# Patient Record
Sex: Female | Born: 1935 | Race: White | Hispanic: No | Smoking: Never smoker
Health system: Southern US, Community
[De-identification: ages and names within clinical notes are randomized; demographics above are authoritative.]

## PROBLEM LIST (undated history)

## (undated) DIAGNOSIS — F0390 Unspecified dementia without behavioral disturbance: Secondary | ICD-10-CM

## (undated) DIAGNOSIS — K5792 Diverticulitis of intestine, part unspecified, without perforation or abscess without bleeding: Secondary | ICD-10-CM

## (undated) DIAGNOSIS — E1122 Type 2 diabetes mellitus with diabetic chronic kidney disease: Secondary | ICD-10-CM

## (undated) DIAGNOSIS — D649 Anemia, unspecified: Secondary | ICD-10-CM

## (undated) DIAGNOSIS — E559 Vitamin D deficiency, unspecified: Secondary | ICD-10-CM

## (undated) DIAGNOSIS — I6523 Occlusion and stenosis of bilateral carotid arteries: Secondary | ICD-10-CM

## (undated) DIAGNOSIS — M810 Age-related osteoporosis without current pathological fracture: Secondary | ICD-10-CM

## (undated) DIAGNOSIS — N183 Chronic kidney disease, stage 3 unspecified: Secondary | ICD-10-CM

## (undated) DIAGNOSIS — E119 Type 2 diabetes mellitus without complications: Secondary | ICD-10-CM

## (undated) DIAGNOSIS — M25471 Effusion, right ankle: Secondary | ICD-10-CM

## (undated) DIAGNOSIS — E538 Deficiency of other specified B group vitamins: Secondary | ICD-10-CM

## (undated) DIAGNOSIS — E785 Hyperlipidemia, unspecified: Secondary | ICD-10-CM

## (undated) DIAGNOSIS — I739 Peripheral vascular disease, unspecified: Secondary | ICD-10-CM

## (undated) DIAGNOSIS — I10 Essential (primary) hypertension: Secondary | ICD-10-CM

## (undated) DIAGNOSIS — N952 Postmenopausal atrophic vaginitis: Secondary | ICD-10-CM

## (undated) DIAGNOSIS — M25472 Effusion, left ankle: Secondary | ICD-10-CM

---

## 2003-12-06 ENCOUNTER — Other Ambulatory Visit: Payer: Self-pay

## 2004-08-07 ENCOUNTER — Ambulatory Visit: Payer: Self-pay

## 2004-10-01 ENCOUNTER — Ambulatory Visit: Payer: Self-pay | Admitting: Unknown Physician Specialty

## 2004-10-01 ENCOUNTER — Other Ambulatory Visit: Payer: Self-pay

## 2004-10-05 ENCOUNTER — Ambulatory Visit: Payer: Self-pay | Admitting: Unknown Physician Specialty

## 2004-11-07 ENCOUNTER — Ambulatory Visit: Payer: Self-pay | Admitting: Unknown Physician Specialty

## 2004-11-29 ENCOUNTER — Ambulatory Visit: Payer: Self-pay | Admitting: Unknown Physician Specialty

## 2005-01-11 ENCOUNTER — Ambulatory Visit: Payer: Self-pay | Admitting: Unknown Physician Specialty

## 2005-02-28 ENCOUNTER — Ambulatory Visit: Payer: Self-pay | Admitting: Unknown Physician Specialty

## 2005-03-28 ENCOUNTER — Ambulatory Visit: Payer: Self-pay | Admitting: Unknown Physician Specialty

## 2005-04-16 ENCOUNTER — Ambulatory Visit: Payer: Self-pay | Admitting: Unknown Physician Specialty

## 2005-05-30 ENCOUNTER — Ambulatory Visit: Payer: Self-pay | Admitting: Unknown Physician Specialty

## 2005-09-10 ENCOUNTER — Ambulatory Visit: Payer: Self-pay | Admitting: Unknown Physician Specialty

## 2006-05-15 ENCOUNTER — Ambulatory Visit: Payer: Self-pay | Admitting: Unknown Physician Specialty

## 2006-07-15 ENCOUNTER — Ambulatory Visit: Payer: Self-pay | Admitting: Unknown Physician Specialty

## 2007-02-07 ENCOUNTER — Emergency Department: Payer: Self-pay | Admitting: Emergency Medicine

## 2007-02-11 ENCOUNTER — Ambulatory Visit: Payer: Self-pay | Admitting: Unknown Physician Specialty

## 2007-03-29 ENCOUNTER — Emergency Department: Payer: Self-pay | Admitting: Emergency Medicine

## 2008-11-01 ENCOUNTER — Ambulatory Visit: Payer: Self-pay | Admitting: Oncology

## 2008-11-15 ENCOUNTER — Ambulatory Visit: Payer: Self-pay | Admitting: Unknown Physician Specialty

## 2008-12-01 ENCOUNTER — Ambulatory Visit: Payer: Self-pay | Admitting: Oncology

## 2008-12-07 ENCOUNTER — Ambulatory Visit: Payer: Self-pay | Admitting: Oncology

## 2009-01-01 ENCOUNTER — Ambulatory Visit: Payer: Self-pay | Admitting: Oncology

## 2009-02-01 ENCOUNTER — Ambulatory Visit: Payer: Self-pay | Admitting: Oncology

## 2009-02-20 ENCOUNTER — Ambulatory Visit: Payer: Self-pay | Admitting: Oncology

## 2009-03-03 ENCOUNTER — Ambulatory Visit: Payer: Self-pay | Admitting: Oncology

## 2009-06-03 ENCOUNTER — Ambulatory Visit: Payer: Self-pay | Admitting: Oncology

## 2009-06-28 ENCOUNTER — Ambulatory Visit: Payer: Self-pay | Admitting: Oncology

## 2009-07-04 ENCOUNTER — Ambulatory Visit: Payer: Self-pay | Admitting: Oncology

## 2010-02-12 ENCOUNTER — Ambulatory Visit: Payer: Self-pay | Admitting: Unknown Physician Specialty

## 2010-04-09 ENCOUNTER — Ambulatory Visit: Payer: Self-pay | Admitting: Unknown Physician Specialty

## 2010-04-11 LAB — PATHOLOGY REPORT

## 2010-07-15 ENCOUNTER — Emergency Department: Payer: Self-pay | Admitting: Unknown Physician Specialty

## 2011-02-13 ENCOUNTER — Ambulatory Visit: Payer: Self-pay | Admitting: Unknown Physician Specialty

## 2012-05-28 ENCOUNTER — Ambulatory Visit: Payer: Self-pay | Admitting: Unknown Physician Specialty

## 2012-05-30 ENCOUNTER — Emergency Department: Payer: Self-pay | Admitting: Unknown Physician Specialty

## 2013-05-31 ENCOUNTER — Ambulatory Visit: Payer: Self-pay | Admitting: Internal Medicine

## 2015-06-19 DIAGNOSIS — H40033 Anatomical narrow angle, bilateral: Secondary | ICD-10-CM | POA: Diagnosis not present

## 2015-06-26 DIAGNOSIS — G309 Alzheimer's disease, unspecified: Secondary | ICD-10-CM | POA: Diagnosis not present

## 2015-06-26 DIAGNOSIS — N183 Chronic kidney disease, stage 3 (moderate): Secondary | ICD-10-CM | POA: Diagnosis not present

## 2015-06-26 DIAGNOSIS — I10 Essential (primary) hypertension: Secondary | ICD-10-CM | POA: Diagnosis not present

## 2015-06-26 DIAGNOSIS — E785 Hyperlipidemia, unspecified: Secondary | ICD-10-CM | POA: Diagnosis not present

## 2015-06-26 DIAGNOSIS — E1129 Type 2 diabetes mellitus with other diabetic kidney complication: Secondary | ICD-10-CM | POA: Diagnosis not present

## 2015-07-03 DIAGNOSIS — N183 Chronic kidney disease, stage 3 (moderate): Secondary | ICD-10-CM | POA: Diagnosis not present

## 2015-07-03 DIAGNOSIS — I10 Essential (primary) hypertension: Secondary | ICD-10-CM | POA: Diagnosis not present

## 2015-07-03 DIAGNOSIS — E785 Hyperlipidemia, unspecified: Secondary | ICD-10-CM | POA: Diagnosis not present

## 2015-07-31 DIAGNOSIS — H40003 Preglaucoma, unspecified, bilateral: Secondary | ICD-10-CM | POA: Diagnosis not present

## 2015-08-04 DIAGNOSIS — H40032 Anatomical narrow angle, left eye: Secondary | ICD-10-CM | POA: Diagnosis not present

## 2015-08-26 DIAGNOSIS — E119 Type 2 diabetes mellitus without complications: Secondary | ICD-10-CM | POA: Diagnosis not present

## 2015-08-31 DIAGNOSIS — H40031 Anatomical narrow angle, right eye: Secondary | ICD-10-CM | POA: Diagnosis not present

## 2015-09-14 DIAGNOSIS — E119 Type 2 diabetes mellitus without complications: Secondary | ICD-10-CM | POA: Diagnosis not present

## 2015-09-14 DIAGNOSIS — B351 Tinea unguium: Secondary | ICD-10-CM | POA: Diagnosis not present

## 2015-09-14 DIAGNOSIS — M79671 Pain in right foot: Secondary | ICD-10-CM | POA: Diagnosis not present

## 2015-09-14 DIAGNOSIS — H40033 Anatomical narrow angle, bilateral: Secondary | ICD-10-CM | POA: Diagnosis not present

## 2015-09-14 DIAGNOSIS — M79672 Pain in left foot: Secondary | ICD-10-CM | POA: Diagnosis not present

## 2015-11-13 DIAGNOSIS — G609 Hereditary and idiopathic neuropathy, unspecified: Secondary | ICD-10-CM | POA: Diagnosis not present

## 2015-11-13 DIAGNOSIS — E785 Hyperlipidemia, unspecified: Secondary | ICD-10-CM | POA: Diagnosis not present

## 2015-11-13 DIAGNOSIS — F0280 Dementia in other diseases classified elsewhere without behavioral disturbance: Secondary | ICD-10-CM | POA: Diagnosis not present

## 2015-11-13 DIAGNOSIS — I10 Essential (primary) hypertension: Secondary | ICD-10-CM | POA: Diagnosis not present

## 2015-11-13 DIAGNOSIS — N183 Chronic kidney disease, stage 3 (moderate): Secondary | ICD-10-CM | POA: Diagnosis not present

## 2015-11-22 DIAGNOSIS — E785 Hyperlipidemia, unspecified: Secondary | ICD-10-CM | POA: Diagnosis not present

## 2015-11-22 DIAGNOSIS — D519 Vitamin B12 deficiency anemia, unspecified: Secondary | ICD-10-CM | POA: Diagnosis not present

## 2015-11-22 DIAGNOSIS — I10 Essential (primary) hypertension: Secondary | ICD-10-CM | POA: Diagnosis not present

## 2015-11-22 DIAGNOSIS — E1129 Type 2 diabetes mellitus with other diabetic kidney complication: Secondary | ICD-10-CM | POA: Diagnosis not present

## 2015-11-22 DIAGNOSIS — Z79899 Other long term (current) drug therapy: Secondary | ICD-10-CM | POA: Diagnosis not present

## 2015-11-22 DIAGNOSIS — D631 Anemia in chronic kidney disease: Secondary | ICD-10-CM | POA: Diagnosis not present

## 2015-12-14 DIAGNOSIS — R261 Paralytic gait: Secondary | ICD-10-CM | POA: Diagnosis not present

## 2015-12-14 DIAGNOSIS — M79674 Pain in right toe(s): Secondary | ICD-10-CM | POA: Diagnosis not present

## 2015-12-14 DIAGNOSIS — I70203 Unspecified atherosclerosis of native arteries of extremities, bilateral legs: Secondary | ICD-10-CM | POA: Diagnosis not present

## 2015-12-14 DIAGNOSIS — B351 Tinea unguium: Secondary | ICD-10-CM | POA: Diagnosis not present

## 2015-12-28 DIAGNOSIS — H40033 Anatomical narrow angle, bilateral: Secondary | ICD-10-CM | POA: Diagnosis not present

## 2016-01-30 DIAGNOSIS — H25813 Combined forms of age-related cataract, bilateral: Secondary | ICD-10-CM | POA: Diagnosis not present

## 2016-01-30 DIAGNOSIS — Z7984 Long term (current) use of oral hypoglycemic drugs: Secondary | ICD-10-CM | POA: Diagnosis not present

## 2016-01-30 DIAGNOSIS — E119 Type 2 diabetes mellitus without complications: Secondary | ICD-10-CM | POA: Diagnosis not present

## 2016-01-30 DIAGNOSIS — H401134 Primary open-angle glaucoma, bilateral, indeterminate stage: Secondary | ICD-10-CM | POA: Diagnosis not present

## 2016-02-28 DIAGNOSIS — H40033 Anatomical narrow angle, bilateral: Secondary | ICD-10-CM | POA: Diagnosis not present

## 2016-03-28 DIAGNOSIS — E1129 Type 2 diabetes mellitus with other diabetic kidney complication: Secondary | ICD-10-CM | POA: Diagnosis not present

## 2016-03-28 DIAGNOSIS — Z79899 Other long term (current) drug therapy: Secondary | ICD-10-CM | POA: Diagnosis not present

## 2016-03-28 DIAGNOSIS — I10 Essential (primary) hypertension: Secondary | ICD-10-CM | POA: Diagnosis not present

## 2016-04-01 DIAGNOSIS — B351 Tinea unguium: Secondary | ICD-10-CM | POA: Diagnosis not present

## 2016-04-01 DIAGNOSIS — E1129 Type 2 diabetes mellitus with other diabetic kidney complication: Secondary | ICD-10-CM | POA: Diagnosis not present

## 2016-04-01 DIAGNOSIS — E875 Hyperkalemia: Secondary | ICD-10-CM | POA: Diagnosis not present

## 2016-04-11 DIAGNOSIS — Z79899 Other long term (current) drug therapy: Secondary | ICD-10-CM | POA: Diagnosis not present

## 2016-04-29 DIAGNOSIS — N183 Chronic kidney disease, stage 3 (moderate): Secondary | ICD-10-CM | POA: Diagnosis not present

## 2016-04-29 DIAGNOSIS — E1129 Type 2 diabetes mellitus with other diabetic kidney complication: Secondary | ICD-10-CM | POA: Diagnosis not present

## 2016-04-29 DIAGNOSIS — E875 Hyperkalemia: Secondary | ICD-10-CM | POA: Diagnosis not present

## 2016-04-29 DIAGNOSIS — D649 Anemia, unspecified: Secondary | ICD-10-CM | POA: Diagnosis not present

## 2016-04-29 DIAGNOSIS — I150 Renovascular hypertension: Secondary | ICD-10-CM | POA: Diagnosis not present

## 2016-05-02 DIAGNOSIS — Z79899 Other long term (current) drug therapy: Secondary | ICD-10-CM | POA: Diagnosis not present

## 2016-05-08 DIAGNOSIS — M79674 Pain in right toe(s): Secondary | ICD-10-CM | POA: Diagnosis not present

## 2016-05-08 DIAGNOSIS — E119 Type 2 diabetes mellitus without complications: Secondary | ICD-10-CM | POA: Diagnosis not present

## 2016-05-08 DIAGNOSIS — I70203 Unspecified atherosclerosis of native arteries of extremities, bilateral legs: Secondary | ICD-10-CM | POA: Diagnosis not present

## 2016-05-08 DIAGNOSIS — B351 Tinea unguium: Secondary | ICD-10-CM | POA: Diagnosis not present

## 2016-05-13 DIAGNOSIS — E875 Hyperkalemia: Secondary | ICD-10-CM | POA: Diagnosis not present

## 2016-05-13 DIAGNOSIS — I10 Essential (primary) hypertension: Secondary | ICD-10-CM | POA: Diagnosis not present

## 2016-05-13 DIAGNOSIS — F0280 Dementia in other diseases classified elsewhere without behavioral disturbance: Secondary | ICD-10-CM | POA: Diagnosis not present

## 2016-05-17 DIAGNOSIS — D519 Vitamin B12 deficiency anemia, unspecified: Secondary | ICD-10-CM | POA: Diagnosis not present

## 2016-05-17 DIAGNOSIS — Z79899 Other long term (current) drug therapy: Secondary | ICD-10-CM | POA: Diagnosis not present

## 2016-05-17 DIAGNOSIS — D649 Anemia, unspecified: Secondary | ICD-10-CM | POA: Diagnosis not present

## 2016-05-17 DIAGNOSIS — D631 Anemia in chronic kidney disease: Secondary | ICD-10-CM | POA: Diagnosis not present

## 2016-06-10 DIAGNOSIS — D649 Anemia, unspecified: Secondary | ICD-10-CM | POA: Diagnosis not present

## 2016-06-10 DIAGNOSIS — E119 Type 2 diabetes mellitus without complications: Secondary | ICD-10-CM | POA: Diagnosis not present

## 2016-06-10 DIAGNOSIS — I150 Renovascular hypertension: Secondary | ICD-10-CM | POA: Diagnosis not present

## 2016-06-10 DIAGNOSIS — I498 Other specified cardiac arrhythmias: Secondary | ICD-10-CM | POA: Diagnosis not present

## 2016-06-10 DIAGNOSIS — R54 Age-related physical debility: Secondary | ICD-10-CM | POA: Diagnosis not present

## 2016-06-10 DIAGNOSIS — I10 Essential (primary) hypertension: Secondary | ICD-10-CM | POA: Diagnosis not present

## 2016-06-11 DIAGNOSIS — R001 Bradycardia, unspecified: Secondary | ICD-10-CM | POA: Diagnosis not present

## 2016-06-27 DIAGNOSIS — E785 Hyperlipidemia, unspecified: Secondary | ICD-10-CM | POA: Diagnosis not present

## 2016-06-27 DIAGNOSIS — R001 Bradycardia, unspecified: Secondary | ICD-10-CM | POA: Diagnosis not present

## 2016-06-27 DIAGNOSIS — E1129 Type 2 diabetes mellitus with other diabetic kidney complication: Secondary | ICD-10-CM | POA: Diagnosis not present

## 2016-06-27 DIAGNOSIS — Z79899 Other long term (current) drug therapy: Secondary | ICD-10-CM | POA: Diagnosis not present

## 2016-07-08 DIAGNOSIS — N183 Chronic kidney disease, stage 3 (moderate): Secondary | ICD-10-CM | POA: Diagnosis not present

## 2016-07-08 DIAGNOSIS — I10 Essential (primary) hypertension: Secondary | ICD-10-CM | POA: Diagnosis not present

## 2016-07-08 DIAGNOSIS — I4519 Other right bundle-branch block: Secondary | ICD-10-CM | POA: Diagnosis not present

## 2016-07-08 DIAGNOSIS — I498 Other specified cardiac arrhythmias: Secondary | ICD-10-CM | POA: Diagnosis not present

## 2016-07-08 DIAGNOSIS — R54 Age-related physical debility: Secondary | ICD-10-CM | POA: Diagnosis not present

## 2016-07-16 DIAGNOSIS — E113291 Type 2 diabetes mellitus with mild nonproliferative diabetic retinopathy without macular edema, right eye: Secondary | ICD-10-CM | POA: Diagnosis not present

## 2016-07-16 DIAGNOSIS — H401134 Primary open-angle glaucoma, bilateral, indeterminate stage: Secondary | ICD-10-CM | POA: Diagnosis not present

## 2016-07-16 DIAGNOSIS — E113312 Type 2 diabetes mellitus with moderate nonproliferative diabetic retinopathy with macular edema, left eye: Secondary | ICD-10-CM | POA: Diagnosis not present

## 2016-07-16 DIAGNOSIS — H25813 Combined forms of age-related cataract, bilateral: Secondary | ICD-10-CM | POA: Diagnosis not present

## 2016-07-23 DIAGNOSIS — G309 Alzheimer's disease, unspecified: Secondary | ICD-10-CM | POA: Diagnosis not present

## 2016-07-23 DIAGNOSIS — D649 Anemia, unspecified: Secondary | ICD-10-CM | POA: Diagnosis not present

## 2016-08-06 DIAGNOSIS — I4519 Other right bundle-branch block: Secondary | ICD-10-CM | POA: Diagnosis not present

## 2016-08-06 DIAGNOSIS — N183 Chronic kidney disease, stage 3 (moderate): Secondary | ICD-10-CM | POA: Diagnosis not present

## 2016-08-06 DIAGNOSIS — R54 Age-related physical debility: Secondary | ICD-10-CM | POA: Diagnosis not present

## 2016-08-06 DIAGNOSIS — I10 Essential (primary) hypertension: Secondary | ICD-10-CM | POA: Diagnosis not present

## 2016-08-06 DIAGNOSIS — D649 Anemia, unspecified: Secondary | ICD-10-CM | POA: Diagnosis not present

## 2016-08-06 DIAGNOSIS — E785 Hyperlipidemia, unspecified: Secondary | ICD-10-CM | POA: Diagnosis not present

## 2016-08-06 DIAGNOSIS — E119 Type 2 diabetes mellitus without complications: Secondary | ICD-10-CM | POA: Diagnosis not present

## 2016-08-06 DIAGNOSIS — I498 Other specified cardiac arrhythmias: Secondary | ICD-10-CM | POA: Diagnosis not present

## 2016-08-13 DIAGNOSIS — F0390 Unspecified dementia without behavioral disturbance: Secondary | ICD-10-CM | POA: Diagnosis not present

## 2016-08-20 DIAGNOSIS — I10 Essential (primary) hypertension: Secondary | ICD-10-CM | POA: Diagnosis not present

## 2016-08-20 DIAGNOSIS — Z833 Family history of diabetes mellitus: Secondary | ICD-10-CM | POA: Diagnosis not present

## 2016-08-20 DIAGNOSIS — D519 Vitamin B12 deficiency anemia, unspecified: Secondary | ICD-10-CM | POA: Diagnosis not present

## 2016-08-23 DIAGNOSIS — R001 Bradycardia, unspecified: Secondary | ICD-10-CM | POA: Diagnosis not present

## 2016-08-23 DIAGNOSIS — D649 Anemia, unspecified: Secondary | ICD-10-CM | POA: Diagnosis not present

## 2016-08-23 DIAGNOSIS — E782 Mixed hyperlipidemia: Secondary | ICD-10-CM | POA: Diagnosis not present

## 2016-08-23 DIAGNOSIS — I6523 Occlusion and stenosis of bilateral carotid arteries: Secondary | ICD-10-CM | POA: Diagnosis not present

## 2016-08-23 DIAGNOSIS — R0602 Shortness of breath: Secondary | ICD-10-CM | POA: Diagnosis not present

## 2016-08-23 DIAGNOSIS — I10 Essential (primary) hypertension: Secondary | ICD-10-CM | POA: Diagnosis not present

## 2016-08-29 DIAGNOSIS — R001 Bradycardia, unspecified: Secondary | ICD-10-CM | POA: Diagnosis not present

## 2016-09-18 DIAGNOSIS — R0602 Shortness of breath: Secondary | ICD-10-CM | POA: Diagnosis not present

## 2016-09-18 DIAGNOSIS — I6523 Occlusion and stenosis of bilateral carotid arteries: Secondary | ICD-10-CM | POA: Diagnosis not present

## 2016-09-18 DIAGNOSIS — E782 Mixed hyperlipidemia: Secondary | ICD-10-CM | POA: Diagnosis not present

## 2016-09-18 DIAGNOSIS — I10 Essential (primary) hypertension: Secondary | ICD-10-CM | POA: Diagnosis not present

## 2016-09-20 DIAGNOSIS — H401133 Primary open-angle glaucoma, bilateral, severe stage: Secondary | ICD-10-CM | POA: Diagnosis not present

## 2016-10-22 DIAGNOSIS — H401133 Primary open-angle glaucoma, bilateral, severe stage: Secondary | ICD-10-CM | POA: Diagnosis not present

## 2016-10-29 DIAGNOSIS — H401133 Primary open-angle glaucoma, bilateral, severe stage: Secondary | ICD-10-CM | POA: Diagnosis not present

## 2016-11-05 DIAGNOSIS — Z833 Family history of diabetes mellitus: Secondary | ICD-10-CM | POA: Diagnosis not present

## 2016-11-05 DIAGNOSIS — E1129 Type 2 diabetes mellitus with other diabetic kidney complication: Secondary | ICD-10-CM | POA: Diagnosis not present

## 2016-11-05 DIAGNOSIS — D519 Vitamin B12 deficiency anemia, unspecified: Secondary | ICD-10-CM | POA: Diagnosis not present

## 2016-11-05 DIAGNOSIS — Z79899 Other long term (current) drug therapy: Secondary | ICD-10-CM | POA: Diagnosis not present

## 2016-11-05 DIAGNOSIS — D631 Anemia in chronic kidney disease: Secondary | ICD-10-CM | POA: Diagnosis not present

## 2016-11-13 DIAGNOSIS — E782 Mixed hyperlipidemia: Secondary | ICD-10-CM | POA: Diagnosis not present

## 2016-11-13 DIAGNOSIS — I10 Essential (primary) hypertension: Secondary | ICD-10-CM | POA: Diagnosis not present

## 2016-11-13 DIAGNOSIS — I6523 Occlusion and stenosis of bilateral carotid arteries: Secondary | ICD-10-CM | POA: Diagnosis not present

## 2016-11-13 DIAGNOSIS — R001 Bradycardia, unspecified: Secondary | ICD-10-CM | POA: Diagnosis not present

## 2016-11-26 DIAGNOSIS — I498 Other specified cardiac arrhythmias: Secondary | ICD-10-CM | POA: Diagnosis not present

## 2016-11-26 DIAGNOSIS — R54 Age-related physical debility: Secondary | ICD-10-CM | POA: Diagnosis not present

## 2016-11-26 DIAGNOSIS — E1140 Type 2 diabetes mellitus with diabetic neuropathy, unspecified: Secondary | ICD-10-CM | POA: Diagnosis not present

## 2016-11-26 DIAGNOSIS — E119 Type 2 diabetes mellitus without complications: Secondary | ICD-10-CM | POA: Diagnosis not present

## 2016-11-26 DIAGNOSIS — D649 Anemia, unspecified: Secondary | ICD-10-CM | POA: Diagnosis not present

## 2016-11-26 DIAGNOSIS — I10 Essential (primary) hypertension: Secondary | ICD-10-CM | POA: Diagnosis not present

## 2016-11-26 DIAGNOSIS — N183 Chronic kidney disease, stage 3 (moderate): Secondary | ICD-10-CM | POA: Diagnosis not present

## 2016-11-26 DIAGNOSIS — E785 Hyperlipidemia, unspecified: Secondary | ICD-10-CM | POA: Diagnosis not present

## 2016-11-29 DIAGNOSIS — H401133 Primary open-angle glaucoma, bilateral, severe stage: Secondary | ICD-10-CM | POA: Diagnosis not present

## 2016-12-27 DIAGNOSIS — E119 Type 2 diabetes mellitus without complications: Secondary | ICD-10-CM | POA: Diagnosis not present

## 2016-12-27 DIAGNOSIS — E1159 Type 2 diabetes mellitus with other circulatory complications: Secondary | ICD-10-CM | POA: Diagnosis not present

## 2017-01-21 DIAGNOSIS — G309 Alzheimer's disease, unspecified: Secondary | ICD-10-CM | POA: Diagnosis not present

## 2017-01-21 DIAGNOSIS — R400 Somnolence: Secondary | ICD-10-CM | POA: Diagnosis not present

## 2017-01-21 DIAGNOSIS — E1140 Type 2 diabetes mellitus with diabetic neuropathy, unspecified: Secondary | ICD-10-CM | POA: Diagnosis not present

## 2017-01-21 DIAGNOSIS — I10 Essential (primary) hypertension: Secondary | ICD-10-CM | POA: Diagnosis not present

## 2017-01-21 DIAGNOSIS — D649 Anemia, unspecified: Secondary | ICD-10-CM | POA: Diagnosis not present

## 2018-02-25 ENCOUNTER — Other Ambulatory Visit: Payer: Self-pay | Admitting: Internal Medicine

## 2018-02-25 DIAGNOSIS — Z1231 Encounter for screening mammogram for malignant neoplasm of breast: Secondary | ICD-10-CM

## 2019-05-09 ENCOUNTER — Other Ambulatory Visit: Payer: Self-pay

## 2019-05-09 ENCOUNTER — Inpatient Hospital Stay
Admission: EM | Admit: 2019-05-09 | Discharge: 2019-05-11 | DRG: 309 | Disposition: A | Discharge status: Skilled Nursing Facility | Payer: Medicare Other | Source: Skilled Nursing Facility | Attending: Family Medicine | Admitting: Family Medicine

## 2019-05-09 ENCOUNTER — Emergency Department: Payer: Medicare Other

## 2019-05-09 DIAGNOSIS — E1140 Type 2 diabetes mellitus with diabetic neuropathy, unspecified: Secondary | ICD-10-CM | POA: Diagnosis present

## 2019-05-09 DIAGNOSIS — I6523 Occlusion and stenosis of bilateral carotid arteries: Secondary | ICD-10-CM | POA: Diagnosis present

## 2019-05-09 DIAGNOSIS — N1832 Chronic kidney disease, stage 3b: Secondary | ICD-10-CM | POA: Diagnosis present

## 2019-05-09 DIAGNOSIS — I160 Hypertensive urgency: Secondary | ICD-10-CM | POA: Diagnosis not present

## 2019-05-09 DIAGNOSIS — E782 Mixed hyperlipidemia: Secondary | ICD-10-CM | POA: Diagnosis present

## 2019-05-09 DIAGNOSIS — E785 Hyperlipidemia, unspecified: Secondary | ICD-10-CM | POA: Diagnosis not present

## 2019-05-09 DIAGNOSIS — I452 Bifascicular block: Secondary | ICD-10-CM | POA: Diagnosis not present

## 2019-05-09 DIAGNOSIS — I4510 Unspecified right bundle-branch block: Secondary | ICD-10-CM | POA: Diagnosis present

## 2019-05-09 DIAGNOSIS — I129 Hypertensive chronic kidney disease with stage 1 through stage 4 chronic kidney disease, or unspecified chronic kidney disease: Secondary | ICD-10-CM | POA: Diagnosis present

## 2019-05-09 DIAGNOSIS — E559 Vitamin D deficiency, unspecified: Secondary | ICD-10-CM | POA: Diagnosis present

## 2019-05-09 DIAGNOSIS — Z888 Allergy status to other drugs, medicaments and biological substances status: Secondary | ICD-10-CM

## 2019-05-09 DIAGNOSIS — R079 Chest pain, unspecified: Secondary | ICD-10-CM | POA: Diagnosis present

## 2019-05-09 DIAGNOSIS — Z20828 Contact with and (suspected) exposure to other viral communicable diseases: Secondary | ICD-10-CM | POA: Diagnosis present

## 2019-05-09 DIAGNOSIS — Z90710 Acquired absence of both cervix and uterus: Secondary | ICD-10-CM

## 2019-05-09 DIAGNOSIS — H409 Unspecified glaucoma: Secondary | ICD-10-CM | POA: Diagnosis present

## 2019-05-09 DIAGNOSIS — H9192 Unspecified hearing loss, left ear: Secondary | ICD-10-CM | POA: Diagnosis present

## 2019-05-09 DIAGNOSIS — I959 Hypotension, unspecified: Secondary | ICD-10-CM | POA: Diagnosis not present

## 2019-05-09 DIAGNOSIS — M810 Age-related osteoporosis without current pathological fracture: Secondary | ICD-10-CM | POA: Diagnosis present

## 2019-05-09 DIAGNOSIS — R001 Bradycardia, unspecified: Secondary | ICD-10-CM

## 2019-05-09 DIAGNOSIS — E538 Deficiency of other specified B group vitamins: Secondary | ICD-10-CM | POA: Diagnosis present

## 2019-05-09 DIAGNOSIS — N179 Acute kidney failure, unspecified: Secondary | ICD-10-CM | POA: Diagnosis present

## 2019-05-09 DIAGNOSIS — R7303 Prediabetes: Secondary | ICD-10-CM | POA: Diagnosis present

## 2019-05-09 DIAGNOSIS — F0280 Dementia in other diseases classified elsewhere without behavioral disturbance: Secondary | ICD-10-CM | POA: Diagnosis present

## 2019-05-09 DIAGNOSIS — Z880 Allergy status to penicillin: Secondary | ICD-10-CM

## 2019-05-09 DIAGNOSIS — D631 Anemia in chronic kidney disease: Secondary | ICD-10-CM | POA: Diagnosis present

## 2019-05-09 DIAGNOSIS — G301 Alzheimer's disease with late onset: Secondary | ICD-10-CM | POA: Diagnosis present

## 2019-05-09 HISTORY — DX: Peripheral vascular disease, unspecified: I73.9

## 2019-05-09 HISTORY — DX: Essential (primary) hypertension: I10

## 2019-05-09 HISTORY — DX: Postmenopausal atrophic vaginitis: N95.2

## 2019-05-09 HISTORY — DX: Effusion, right ankle: M25.472

## 2019-05-09 HISTORY — DX: Diverticulitis of intestine, part unspecified, without perforation or abscess without bleeding: K57.92

## 2019-05-09 HISTORY — DX: Anemia, unspecified: D64.9

## 2019-05-09 HISTORY — DX: Chronic kidney disease, stage 3 unspecified: N18.30

## 2019-05-09 HISTORY — DX: Type 2 diabetes mellitus without complications: E11.9

## 2019-05-09 HISTORY — DX: Age-related osteoporosis without current pathological fracture: M81.0

## 2019-05-09 HISTORY — DX: Deficiency of other specified B group vitamins: E53.8

## 2019-05-09 HISTORY — DX: Vitamin D deficiency, unspecified: E55.9

## 2019-05-09 HISTORY — DX: Type 2 diabetes mellitus with diabetic chronic kidney disease: E11.22

## 2019-05-09 HISTORY — DX: Hyperlipidemia, unspecified: E78.5

## 2019-05-09 HISTORY — DX: Occlusion and stenosis of bilateral carotid arteries: I65.23

## 2019-05-09 HISTORY — DX: Unspecified dementia, unspecified severity, without behavioral disturbance, psychotic disturbance, mood disturbance, and anxiety: F03.90

## 2019-05-09 HISTORY — DX: Effusion, left ankle: M25.471

## 2019-05-09 LAB — CBC
HCT: 34.1 % — ABNORMAL LOW (ref 36.0–46.0)
Hemoglobin: 11.3 g/dL — ABNORMAL LOW (ref 12.0–15.0)
MCH: 29 pg (ref 26.0–34.0)
MCHC: 33.1 g/dL (ref 30.0–36.0)
MCV: 87.7 fL (ref 80.0–100.0)
Platelets: 237 10*3/uL (ref 150–400)
RBC: 3.89 MIL/uL (ref 3.87–5.11)
RDW: 12.8 % (ref 11.5–15.5)
WBC: 6.4 10*3/uL (ref 4.0–10.5)
nRBC: 0 % (ref 0.0–0.2)

## 2019-05-09 LAB — COMPREHENSIVE METABOLIC PANEL
ALT: 10 U/L (ref 0–44)
AST: 19 U/L (ref 15–41)
Albumin: 3.8 g/dL (ref 3.5–5.0)
Alkaline Phosphatase: 62 U/L (ref 38–126)
Anion gap: 8 (ref 5–15)
BUN: 40 mg/dL — ABNORMAL HIGH (ref 8–23)
CO2: 23 mmol/L (ref 22–32)
Calcium: 9 mg/dL (ref 8.9–10.3)
Chloride: 108 mmol/L (ref 98–111)
Creatinine, Ser: 1.31 mg/dL — ABNORMAL HIGH (ref 0.44–1.00)
GFR calc Af Amer: 43 mL/min — ABNORMAL LOW (ref >60)
GFR calc non Af Amer: 37 mL/min — ABNORMAL LOW (ref >60)
Glucose, Bld: 148 mg/dL — ABNORMAL HIGH (ref 70–99)
Potassium: 4.3 mmol/L (ref 3.5–5.1)
Sodium: 139 mmol/L (ref 135–145)
Total Bilirubin: 0.7 mg/dL (ref 0.3–1.2)
Total Protein: 7.3 g/dL (ref 6.5–8.1)

## 2019-05-09 LAB — TROPONIN I (HIGH SENSITIVITY): Troponin I (High Sensitivity): 10 ng/L (ref <18)

## 2019-05-09 MED ORDER — ONDANSETRON HCL 4 MG/2ML IJ SOLN: 4.0000 mg | Freq: Four times a day (QID) | INTRAMUSCULAR | Status: DC | PRN

## 2019-05-09 MED ORDER — ENOXAPARIN SODIUM 40 MG/0.4ML ~~LOC~~ SOLN
40.0000 mg | SUBCUTANEOUS | Status: DC
  Administered 2019-05-10 – 2019-05-11 (×2): 40 mg via SUBCUTANEOUS
  Filled 2019-05-09 (×2): qty 0.4

## 2019-05-09 MED ORDER — NITROGLYCERIN 2 % TD OINT
0.5000 [in_us] | TOPICAL_OINTMENT | Freq: Once | TRANSDERMAL | Status: DC
  Filled 2019-05-09: qty 1

## 2019-05-09 MED ORDER — SODIUM CHLORIDE 0.9 % IV BOLUS
500.0000 mL | Freq: Once | INTRAVENOUS | Status: AC
  Administered 2019-05-09: 500 mL via INTRAVENOUS

## 2019-05-09 MED ORDER — ALUM & MAG HYDROXIDE-SIMETH 200-200-20 MG/5ML PO SUSP
30.0000 mL | Freq: Once | ORAL | Status: AC
  Administered 2019-05-10: 01:00:00 30 mL via ORAL
  Filled 2019-05-09: qty 30

## 2019-05-09 MED ORDER — SODIUM CHLORIDE 0.9 % IV SOLN
INTRAVENOUS | Status: DC
  Administered 2019-05-10: 01:00:00 via INTRAVENOUS

## 2019-05-09 MED ORDER — ACETAMINOPHEN 325 MG PO TABS: 650.0000 mg | ORAL_TABLET | ORAL | Status: DC | PRN

## 2019-05-09 MED ORDER — HYDRALAZINE HCL 50 MG PO TABS
50.0000 mg | ORAL_TABLET | Freq: Once | ORAL | Status: AC
  Administered 2019-05-09: 21:00:00 50 mg via ORAL
  Filled 2019-05-09: qty 1

## 2019-05-09 MED ORDER — LIDOCAINE VISCOUS HCL 2 % MT SOLN
15.0000 mL | Freq: Once | OROMUCOSAL | Status: AC
  Administered 2019-05-10: 01:00:00 15 mL via ORAL
  Filled 2019-05-09: qty 15

## 2019-05-09 MED ORDER — HYDRALAZINE HCL 50 MG PO TABS
25.0000 mg | ORAL_TABLET | Freq: Once | ORAL | Status: AC
  Administered 2019-05-09: 25 mg via ORAL
  Filled 2019-05-09: qty 1

## 2019-05-09 NOTE — H&P (Signed)
[facility] [place] NAME: Martha Walker    MR#:  [id number]  DATE OF BIRTH:  11-May-1932  DATE OF ADMISSION:  05/09/2019  PRIMARY CARE PHYSICIAN: Leonel Ramsay, MD   REQUESTING/REFERRING PHYSICIAN: Merlyn Lot, MD  CHIEF COMPLAINT:   Chief Complaint  Patient presents with  . Hypertension  . Bradycardia    HISTORY OF PRESENT ILLNESS:  Martha Walker  is a 85 y.o. female with a known history of type diabetes mellitus, dyslipidemia, dementia, hypertension and diabetic neuropathy, who presented to the emergency room with acute onset of generalized fatigue and malaise with associated lightheadedness and dizziness as well as dyspnea.  She admitted to chest pressure that she graded 3-4/10 in severity and the midsternal area no cough or wheezing or hemoptysis or fever or chills.  No dyspnea or palpitations.  No nausea vomiting or diarrhea or abdominal pain.  No dysuria, oliguria or hematuria or flank pain.  When she came to the ER blood pressure was elevated 227/50 with otherwise normal vital signs however later on heart rate was down to 43.  Labs revealed a BUN of 40 and creatinine 1.31 and high-sensitivity troponin I of 10 and later 11.  COVID-19 test is currently pending.  EKG showed ectopic atrial bradycardia with a rate of 36 and with right bundle branch block and left anterior fascicular block.  The patient was given GI cocktail as well as 25 mg of and 50 mg of p.o. hydralazine in addition to 500 mL of IV normal saline.  The patient will be admitted to an observation telemetry bed for further evaluation and monitoring. PAST MEDICAL HISTORY:  . Vitamin B12 deficiency  . Essential hypertension  . Vitamin D deficiency  . Type 2 diabetes mellitus with stage 3 chronic kidney disease (CMS-HCC)  . Chronic dementia, without behavioral disturbance (CMS-HCC)  . Mixed hyperlipidemia  . Bilateral carotid artery stenosis  . SDAT (senile dementia of Alzheimer's  type) (CMS-HCC)  . Chronic anemia, unspecified  . Ankle edema, bilateral  . Atrophic vaginitis  .  Diabetic neuropathy (CMS-HCC)  . Diverticulosis  . Glaucoma (increased eye pressure)    Visual loss, Left > Right  . Hearing loss in left ear   Cholesteatoma, s/p surgery  . Osteoporosis, post-menopausal   PAST SURGICAL HISTORY:  . ARTHROSCOPIC ROTATOR CUFF REPAIR  Right  . FRACTURE SURGERY  LEFT FOOT SURGERY FOR A FRACTURE OF THE FIRST TOE  . GANGLION CYST REMOVED BILATERAL  . HYSTERECTOMY  . LEFT EAR SURGERY FOR A CHOLESTEATOMA  SOCIAL HISTORY:   Social History   Tobacco Use  . Smoking status: Not on file  Substance Use Topics  . Alcohol use: Not on file    FAMILY HISTORY:  No family history on file.  DRUG ALLERGIES:   Allergies  Allergen Reactions  . Augmentin [Amoxicillin-Pot Clavulanate]   . Bactrim [Sulfamethoxazole-Trimethoprim]   . Etodolac   . Fosinopril   . Ibudone [Hydrocodone-Ibuprofen]   . Januvia [Sitagliptin]   . Lipitor [Atorvastatin Calcium]   . Lotrel [Amlodipine Besy-Benazepril Hcl]   . Metronidazole   . Mobic [Meloxicam]   . Penicillins   . Pravastatin   . Simvastatin   . Tylenol [Acetaminophen]   . Vicodin [Hydrocodone-Acetaminophen]     REVIEW OF SYSTEMS:   ROS As per history of present illness. All pertinent systems were reviewed above. Constitutional,  HEENT, cardiovascular, respiratory, GI, GU, musculoskeletal, neuro, psychiatric, endocrine,  integumentary and hematologic systems were reviewed and  are otherwise  negative/unremarkable except for positive findings mentioned above in the HPI.   MEDICATIONS AT HOME:   Prior to Admission medications   Not on File      VITAL SIGNS:  Blood pressure (!) 199/72, pulse (!) 44, temperature 98.8 F (37.1 C), temperature source Oral, resp. rate 15, height 5\' 8"  (1.727 m), weight 86.2 kg, SpO2 98 %.  PHYSICAL EXAMINATION:  Physical Exam  GENERAL:  85 y.o.-year-old female patient  lying in the bed with no acute distress.  EYES: Pupils equal, round, reactive to light and accommodation. No scleral icterus. Extraocular muscles intact.  HEENT: Head atraumatic, normocephalic. Oropharynx and nasopharynx clear.  NECK:  Supple, no jugular venous distention. No thyroid enlargement, no tenderness.  LUNGS: Normal breath sounds bilaterally, no wheezing, rales,rhonchi or crepitation. No use of accessory muscles of respiration.  CARDIOVASCULAR: Regular rate and rhythm, S1, S2 normal. No murmurs, rubs, or gallops.  Heart rate has been ranging from 40s to 70s during my interview. ABDOMEN: Soft, nondistended, nontender. Bowel sounds present. No organomegaly or mass.  EXTREMITIES: No pedal edema, cyanosis, or clubbing.  NEUROLOGIC: Cranial nerves II through XII are intact. Muscle strength 5/5 in all extremities. Sensation intact. Gait not checked.  PSYCHIATRIC: The patient is alert and oriented x 3.  Normal affect and good eye contact. SKIN: No obvious rash, lesion, or ulcer.   LABORATORY PANEL:   CBC Recent Labs  Lab 05/09/19 2045  WBC 6.4  HGB 11.3*  HCT 34.1*  PLT 237   ------------------------------------------------------------------------------------------------------------------  Chemistries  Recent Labs  Lab 05/09/19 2128  NA 139  K 4.3  CL 108  CO2 23  GLUCOSE 148*  BUN 40*  CREATININE 1.31*  CALCIUM 9.0  AST 19  ALT 10  ALKPHOS 62  BILITOT 0.7   ------------------------------------------------------------------------------------------------------------------  Cardiac Enzymes No results for input(s): TROPONINI in the last 168 hours. ------------------------------------------------------------------------------------------------------------------  RADIOLOGY:  Ct Head Wo Contrast  Result Date: 05/09/2019 CLINICAL DATA:  Encephalopathy EXAM: CT HEAD WITHOUT CONTRAST TECHNIQUE: Contiguous axial images were obtained from the base of the skull through the  vertex without intravenous contrast. COMPARISON:  None. FINDINGS: Brain: There is no mass, hemorrhage or extra-axial collection. The size and configuration of the ventricles and extra-axial CSF spaces are normal. 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 Brain parenchyma is otherwise normal. Vascular: No abnormal hyperdensity of the major intracranial arteries or dural venous sinuses. No intracranial atherosclerosis. Skull: The visualized skull base, calvarium and extracranial soft tissues are normal. Sinuses/Orbits: No fluid levels or advanced mucosal thickening of the visualized paranasal sinuses. No mastoid or middle ear effusion. The orbits are normal. IMPRESSION: 1. No acute intracranial abnormality. 2. 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 Electronically Signed   By: Ulyses Jarred M.D.   On: 05/09/2019 23:21   Dg Chest Portable 1 View  Result Date: 05/09/2019 CLINICAL DATA:  Malaise. EXAM: PORTABLE CHEST 1 VIEW COMPARISON:  None. FINDINGS: The heart size and mediastinal contours are within normal limits. Both lungs are clear. The visualized skeletal structures are unremarkable. Aortic calcifications are noted. IMPRESSION: No active disease. Electronically Signed   By: Constance Holster M.D.   On: 05/09/2019 21:49      IMPRESSION AND PLAN:   1.  Chest pain, rule out acute coronary syndrome.  The patient will be admitted to an observation telemetry bed.  Will follow serial cardiac enzymes and EKGs.  We will obtain a cardiology consult in a.m.  for further cardiac risk stratification.  The patient will be placed on aspirin as well as p.r.n. sublingual nitroglycerin and morphine sulfate for pain.  I notified Dr. Clayborn Bigness about the patient.  2.  Symptomatic bradycardia.  Cardiology consult will be obtained as mentioned above and I added a 2D echo.  We will continue to monitor her.  3.  Hypertensive urgency.  We will continue p.o. hydralazine and hold HCTZ given mild  acute kidney injury.  The patient will be placed on as needed IV hydralazine.  4.  Dyslipidemia.  We will continue her statin therapy.  5.  DVT prophylaxis.  Subcutaneous Lovenox.    All the records are reviewed and case discussed with ED provider. The plan of care was discussed in details with the patient (and family). I answered all questions. The patient agreed to proceed with the above mentioned plan. Further management will depend upon hospital course.   CODE STATUS: Full code  TOTAL TIME TAKING CARE OF THIS PATIENT: 50 minutes.    Christel Mormon M.D on 05/09/2019 at 11:52 PM  [facility]   From 7 PM-7 AM, contact night-coverage [email or web address]  CC: Primary care physician; Leonel Ramsay, MD   Note: This dictation was prepared with Dragon dictation along with smaller phrase technology. Any transcriptional errors that result from this process are unintentional.

## 2019-05-09 NOTE — ED Triage Notes (Signed)
Pt arrives to ED via [facility] from [facility] with c/o "not feeling well". EMS reports facility reports pt BP has "been trending up" and HR has "been trending down". EMS reports BP of 192/72 and HR variable between 36-45 bpms. Pt denies any c/o CP or SHOB; reports left leg that started this evening. Pt is alert, in NAD; RR even, regular, and unlabored.

## 2019-05-09 NOTE — ED Provider Notes (Addendum)
[facility] Emergency Department Provider Note    First MD Initiated Contact with Patient 05/09/19 2038     (approximate)  I have reviewed the triage vital signs and the nursing notes.   HISTORY  Chief Complaint Hypertension and Bradycardia    HPI Martha Walker is a 85 y.o. female presents for evaluation of generalized malaise feeling unwell as well as elevated blood pressure with low heart rate today.  Denies any headache.  No blurry vision.  No numbness or tingling.  Denies any chest pain or pressure.  No nausea or vomiting.  She did not get her evening dose of blood pressure medications.  Has an extensive list of allergies to blood pressure medications.  States that she did have a brief episode of chest discomfort as well as arm pain prior to arrival.  Not having any chest or back pain at this time.  No diaphoresis.  No recent fevers.   No past medical history on file. No family history on file.  Patient Active Problem List   Diagnosis Date Noted  . Chest pain 05/09/2019      Prior to Admission medications   Not on File    Allergies Augmentin [amoxicillin-pot clavulanate], Bactrim [sulfamethoxazole-trimethoprim], Etodolac, Fosinopril, Ibudone [hydrocodone-ibuprofen], Januvia [sitagliptin], Lipitor [atorvastatin calcium], Lotrel [amlodipine besy-benazepril hcl], Metronidazole, Mobic [meloxicam], Penicillins, Pravastatin, Simvastatin, Tylenol [acetaminophen], and Vicodin [hydrocodone-acetaminophen]    Social History Social History   Tobacco Use  . Smoking status: Not on file  Substance Use Topics  . Alcohol use: Not on file  . Drug use: Not on file    Review of Systems Patient denies headaches, rhinorrhea, blurry vision, numbness, shortness of breath, chest pain, edema, cough, abdominal pain, nausea, vomiting, diarrhea, dysuria, fevers, rashes or hallucinations unless otherwise stated above in HPI.  ____________________________________________   PHYSICAL EXAM:  VITAL SIGNS: Vitals:   05/09/19 2035 05/09/19 2102  BP: (!) 227/50 (!) 235/61  Pulse: 66 (!) 45  Resp: 17 16  Temp: 98.8 F (37.1 C)   SpO2: 99% 99%    Constitutional: Alert and oriented.  Eyes: Conjunctivae are normal.  Head: Atraumatic. Nose: No congestion/rhinnorhea. Mouth/Throat: Mucous membranes are moist.   Neck: No stridor. Painless ROM.  Cardiovascular: Normal rate, regular rhythm. Grossly normal heart sounds.  Good peripheral circulation. Respiratory: Normal respiratory effort.  No retractions. Lungs CTAB. Gastrointestinal: Soft and nontender. No distention. No abdominal bruits. No CVA tenderness. Genitourinary:  Musculoskeletal: No lower extremity tenderness nor edema.  No joint effusions. Neurologic:  Normal speech and language. No gross focal neurologic deficits are appreciated. No facial droop Skin:  Skin is warm, dry and intact. No rash noted. Psychiatric: Mood and affect are normal. Speech and behavior are normal.  ____________________________________________   LABS (all labs ordered are listed, but only abnormal results are displayed)  Results for orders placed or performed during the hospital encounter of 05/09/19 (from the past 24 hour(s))  CBC     Status: Abnormal   Collection Time: 05/09/19  8:45 PM  Result Value Ref Range   WBC 6.4 4.0 - 10.5 K/uL   RBC 3.89 3.87 - 5.11 MIL/uL   Hemoglobin 11.3 (L) 12.0 - 15.0 g/dL   HCT 34.1 (L) 36.0 - 46.0 %   MCV 87.7 80.0 - 100.0 fL   MCH 29.0 26.0 - 34.0 pg   MCHC 33.1 30.0 - 36.0 g/dL   RDW 12.8 11.5 - 15.5 %   Platelets 237 150 - 400 K/uL   nRBC  0.0 0.0 - 0.2 %  Troponin I (High Sensitivity)     Status: None   Collection Time: 05/09/19  9:28 PM  Result Value Ref Range   Troponin I (High Sensitivity) 10 <18 ng/L  Comprehensive metabolic panel     Status: Abnormal   Collection Time: 05/09/19  9:28 PM  Result Value Ref Range   Sodium 139  135 - 145 mmol/L   Potassium 4.3 3.5 - 5.1 mmol/L   Chloride 108 98 - 111 mmol/L   CO2 23 22 - 32 mmol/L   Glucose, Bld 148 (H) 70 - 99 mg/dL   BUN 40 (H) 8 - 23 mg/dL   Creatinine, Ser 1.31 (H) 0.44 - 1.00 mg/dL   Calcium 9.0 8.9 - 10.3 mg/dL   Total Protein 7.3 6.5 - 8.1 g/dL   Albumin 3.8 3.5 - 5.0 g/dL   AST 19 15 - 41 U/L   ALT 10 0 - 44 U/L   Alkaline Phosphatase 62 38 - 126 U/L   Total Bilirubin 0.7 0.3 - 1.2 mg/dL   GFR calc non Af Amer 37 (L) >60 mL/min   GFR calc Af Amer 43 (L) >60 mL/min   Anion gap 8 5 - 15   ____________________________________________  EKG My review and personal interpretation at Time: 20:46   Indication: htn  Rate: 35  Rhythm: sinus bradycardia Axis: normal Other: abnml ekg, no stemi ____________________________________________  RADIOLOGY  I personally reviewed all radiographic images ordered to evaluate for the above acute complaints and reviewed radiology reports and findings.  These findings were personally discussed with the patient.  Please see medical record for radiology report.  ____________________________________________   PROCEDURES  Procedure(s) performed:  Procedures    Critical Care performed: no ____________________________________________   INITIAL IMPRESSION / ASSESSMENT AND PLAN / ED COURSE  Pertinent labs & imaging results that were available during my care of the patient were reviewed by me and considered in my medical decision making (see chart for details).   DDX: bradycardia, electrolyte abn, dehydration, acs, anemia, chf  [name] is a 85 y.o. who presents to the ED with symptoms as described above.  Patient chronically appearing but in no acute obvious distress.  His markedly hypertensive this was bradycardic.  She is currently chest pain-free.  EKG appears to be sinus bradycardia with somewhat limited due to baseline artifact.  Heart rate fluctuating between 30s in the 50s on the monitor.  She denies any  hypoxia.  Chest x-ray without any evidence of mediastinal widening.  She has good peripheral pulses.  Will give her evening hydralazine but given her chest discomfort certainly concerning for hypertensive urgency.  CT imaging shows no evidence of acute intracranial abnormality.  Do not suspect CVA.  Does not seem clinically consistent with dissection.  Based on her hypertension and reproducible chest pain prior to arrival I do believe that observation further medical management of blood pressure clinically indicated particularly given her extensive list of allergies intolerances to blood pressure medication.     The patient was evaluated in Emergency Department today for the symptoms described in the history of present illness. He/she was evaluated in the context of the global COVID-19 pandemic, which necessitated consideration that the patient might be at risk for infection with the SARS-CoV-2 virus that causes COVID-19. Institutional protocols and algorithms that pertain to the evaluation of patients at risk for COVID-19 are in a state of rapid change based on information released by regulatory bodies including the CDC  and federal and state organizations. These policies and algorithms were followed during the patient's care in the ED.  As part of my medical decision making, I reviewed the following data within the [place] notes reviewed and incorporated, Labs reviewed, notes from prior ED visits and [place] Controlled Substance Database   ____________________________________________   FINAL CLINICAL IMPRESSION(S) / ED DIAGNOSES  Final diagnoses:  Hypertensive urgency  Bradycardia      NEW MEDICATIONS STARTED DURING THIS VISIT:  New Prescriptions   No medications on file     Note:  This document was prepared using Dragon voice recognition software and may include unintentional dictation errors.    Merlyn Lot, MD 05/09/19 [id number]    Merlyn Lot, MD  05/09/19 [phone]

## 2019-05-10 ENCOUNTER — Inpatient Hospital Stay
Admit: 2019-05-10 | Discharge: 2019-05-10 | Disposition: A | Discharge status: Home / Self Care | Payer: Medicare Other | Attending: Family Medicine | Admitting: Family Medicine

## 2019-05-10 ENCOUNTER — Encounter: Payer: Self-pay | Admitting: Cardiovascular Disease

## 2019-05-10 ENCOUNTER — Other Ambulatory Visit: Payer: Self-pay

## 2019-05-10 DIAGNOSIS — I4510 Unspecified right bundle-branch block: Secondary | ICD-10-CM | POA: Diagnosis present

## 2019-05-10 DIAGNOSIS — M810 Age-related osteoporosis without current pathological fracture: Secondary | ICD-10-CM | POA: Diagnosis present

## 2019-05-10 DIAGNOSIS — R7303 Prediabetes: Secondary | ICD-10-CM | POA: Diagnosis present

## 2019-05-10 DIAGNOSIS — I959 Hypotension, unspecified: Secondary | ICD-10-CM | POA: Diagnosis not present

## 2019-05-10 DIAGNOSIS — R079 Chest pain, unspecified: Secondary | ICD-10-CM | POA: Diagnosis not present

## 2019-05-10 DIAGNOSIS — E559 Vitamin D deficiency, unspecified: Secondary | ICD-10-CM | POA: Diagnosis present

## 2019-05-10 DIAGNOSIS — D649 Anemia, unspecified: Secondary | ICD-10-CM | POA: Insufficient documentation

## 2019-05-10 DIAGNOSIS — R001 Bradycardia, unspecified: Secondary | ICD-10-CM | POA: Diagnosis present

## 2019-05-10 DIAGNOSIS — H9192 Unspecified hearing loss, left ear: Secondary | ICD-10-CM | POA: Diagnosis present

## 2019-05-10 DIAGNOSIS — N1832 Chronic kidney disease, stage 3b: Secondary | ICD-10-CM | POA: Diagnosis present

## 2019-05-10 DIAGNOSIS — Z90710 Acquired absence of both cervix and uterus: Secondary | ICD-10-CM | POA: Diagnosis not present

## 2019-05-10 DIAGNOSIS — I160 Hypertensive urgency: Secondary | ICD-10-CM | POA: Diagnosis present

## 2019-05-10 DIAGNOSIS — I452 Bifascicular block: Secondary | ICD-10-CM | POA: Diagnosis present

## 2019-05-10 DIAGNOSIS — I6523 Occlusion and stenosis of bilateral carotid arteries: Secondary | ICD-10-CM | POA: Diagnosis present

## 2019-05-10 DIAGNOSIS — Z888 Allergy status to other drugs, medicaments and biological substances status: Secondary | ICD-10-CM | POA: Diagnosis not present

## 2019-05-10 DIAGNOSIS — E538 Deficiency of other specified B group vitamins: Secondary | ICD-10-CM | POA: Diagnosis present

## 2019-05-10 DIAGNOSIS — H409 Unspecified glaucoma: Secondary | ICD-10-CM | POA: Diagnosis present

## 2019-05-10 DIAGNOSIS — I129 Hypertensive chronic kidney disease with stage 1 through stage 4 chronic kidney disease, or unspecified chronic kidney disease: Secondary | ICD-10-CM | POA: Diagnosis present

## 2019-05-10 DIAGNOSIS — D631 Anemia in chronic kidney disease: Secondary | ICD-10-CM | POA: Diagnosis present

## 2019-05-10 DIAGNOSIS — N179 Acute kidney failure, unspecified: Secondary | ICD-10-CM | POA: Diagnosis present

## 2019-05-10 DIAGNOSIS — Z880 Allergy status to penicillin: Secondary | ICD-10-CM | POA: Diagnosis not present

## 2019-05-10 DIAGNOSIS — Z20828 Contact with and (suspected) exposure to other viral communicable diseases: Secondary | ICD-10-CM | POA: Diagnosis present

## 2019-05-10 DIAGNOSIS — E782 Mixed hyperlipidemia: Secondary | ICD-10-CM | POA: Diagnosis present

## 2019-05-10 DIAGNOSIS — F0280 Dementia in other diseases classified elsewhere without behavioral disturbance: Secondary | ICD-10-CM | POA: Diagnosis present

## 2019-05-10 DIAGNOSIS — E1140 Type 2 diabetes mellitus with diabetic neuropathy, unspecified: Secondary | ICD-10-CM | POA: Diagnosis present

## 2019-05-10 DIAGNOSIS — G301 Alzheimer's disease with late onset: Secondary | ICD-10-CM | POA: Diagnosis present

## 2019-05-10 LAB — CBC
HCT: 36.6 % (ref 36.0–46.0)
Hemoglobin: 11.8 g/dL — ABNORMAL LOW (ref 12.0–15.0)
MCH: 29 pg (ref 26.0–34.0)
MCHC: 32.2 g/dL (ref 30.0–36.0)
MCV: 89.9 fL (ref 80.0–100.0)
Platelets: 238 10*3/uL (ref 150–400)
RBC: 4.07 MIL/uL (ref 3.87–5.11)
RDW: 12.7 % (ref 11.5–15.5)
WBC: 8.7 10*3/uL (ref 4.0–10.5)
nRBC: 0 % (ref 0.0–0.2)

## 2019-05-10 LAB — BASIC METABOLIC PANEL
Anion gap: 8 (ref 5–15)
BUN: 34 mg/dL — ABNORMAL HIGH (ref 8–23)
CO2: 23 mmol/L (ref 22–32)
Calcium: 9 mg/dL (ref 8.9–10.3)
Chloride: 109 mmol/L (ref 98–111)
Creatinine, Ser: 1.06 mg/dL — ABNORMAL HIGH (ref 0.44–1.00)
GFR calc Af Amer: 55 mL/min — ABNORMAL LOW (ref >60)
GFR calc non Af Amer: 48 mL/min — ABNORMAL LOW (ref >60)
Glucose, Bld: 193 mg/dL — ABNORMAL HIGH (ref 70–99)
Potassium: 4.1 mmol/L (ref 3.5–5.1)
Sodium: 140 mmol/L (ref 135–145)

## 2019-05-10 LAB — TROPONIN I (HIGH SENSITIVITY): Troponin I (High Sensitivity): 11 ng/L (ref <18)

## 2019-05-10 LAB — SARS CORONAVIRUS 2 (TAT 6-24 HRS): SARS Coronavirus 2: NEGATIVE

## 2019-05-10 MED ORDER — NETARSUDIL DIMESYLATE 0.02 % OP SOLN: 1.0000 [drp] | Freq: Every day | OPHTHALMIC | Status: DC

## 2019-05-10 MED ORDER — LATANOPROST 0.005 % OP SOLN
1.0000 [drp] | Freq: Every day | OPHTHALMIC | Status: DC
  Administered 2019-05-10: 21:00:00 1 [drp] via OPHTHALMIC
  Filled 2019-05-10: qty 2.5

## 2019-05-10 MED ORDER — BRIMONIDINE TARTRATE 0.2 % OP SOLN
1.0000 [drp] | Freq: Two times a day (BID) | OPHTHALMIC | Status: DC
  Administered 2019-05-10 – 2019-05-11 (×3): 1 [drp] via OPHTHALMIC
  Filled 2019-05-10: qty 5

## 2019-05-10 MED ORDER — HYDRALAZINE HCL 20 MG/ML IJ SOLN: 10.0000 mg | Freq: Four times a day (QID) | INTRAMUSCULAR | Status: DC | PRN

## 2019-05-10 MED ORDER — ROSUVASTATIN CALCIUM 5 MG PO TABS
5.0000 mg | ORAL_TABLET | Freq: Every day | ORAL | Status: DC
  Administered 2019-05-10 – 2019-05-11 (×2): 5 mg via ORAL
  Filled 2019-05-10 (×2): qty 1

## 2019-05-10 MED ORDER — SODIUM CHLORIDE 0.9% FLUSH
10.0000 mL | Freq: Two times a day (BID) | INTRAVENOUS | Status: DC
  Administered 2019-05-10 – 2019-05-11 (×2): 10 mL via INTRAVENOUS

## 2019-05-10 MED ORDER — SODIUM CHLORIDE 0.9 % IV BOLUS
500.0000 mL | Freq: Once | INTRAVENOUS | Status: AC
  Administered 2019-05-10: 500 mL via INTRAVENOUS

## 2019-05-10 MED ORDER — IRBESARTAN 150 MG PO TABS
75.0000 mg | ORAL_TABLET | Freq: Every day | ORAL | Status: DC
  Administered 2019-05-10 – 2019-05-11 (×2): 75 mg via ORAL
  Filled 2019-05-10 (×2): qty 1

## 2019-05-10 MED ORDER — DORZOLAMIDE HCL-TIMOLOL MAL 2-0.5 % OP SOLN
1.0000 [drp] | Freq: Two times a day (BID) | OPHTHALMIC | Status: DC
  Administered 2019-05-10: 1 [drp] via OPHTHALMIC
  Filled 2019-05-10: qty 10

## 2019-05-10 MED ORDER — HYDRALAZINE HCL 25 MG PO TABS
25.0000 mg | ORAL_TABLET | Freq: Four times a day (QID) | ORAL | Status: DC | PRN
  Administered 2019-05-10: 25 mg via ORAL
  Filled 2019-05-10: qty 1

## 2019-05-10 MED ORDER — HYDRALAZINE HCL 50 MG PO TABS
50.0000 mg | ORAL_TABLET | Freq: Two times a day (BID) | ORAL | Status: DC
  Administered 2019-05-10: 10:00:00 50 mg via ORAL
  Filled 2019-05-10: qty 1

## 2019-05-10 NOTE — Consult Note (Signed)
[facility]  CARDIOLOGY CONSULT NOTE  Patient ID: SILAS MICHIELS MRN: [id number] DOB/AGE: 1932/01/19 85 y.o.  Admit date: 05/09/2019 Referring Physician [place] Primary Physician [facility] Cardiologist Nehemiah Massed Reason for Consultation chest pain and bradycardia  HPI: 85 year old female referred for evaluation of chest pain and bradycardia. Patient has baseline dementia, resident at the [facility].  She is brought to [facility] ED with generalized fatigue and malaise.  On admission, patient also reported 3 out of 10 chest discomfort.  ECG revealed ectopic atrial rhythm at 36 bpm with right bundle branch block without ischemic ECG changes.  Admission labs notable for troponin of 10 and 11.  Patient currently denies chest pain or shortness of breath.  Telemetry reveals sinus rhythm fluctuating between 50 and 70 bpm.  Patient was markedly hypertensive upon admission with a blood pressure of 227/50.  Head CT was negative for CVA.  Review of systems complete and found to be negative unless listed above     Past Medical History:  Diagnosis Date  . Ankle edema, bilateral   . Atrophic vaginitis   . B12 deficiency   . Bilateral carotid artery stenosis   . Chronic anemia   . CKD stage 3 due to type 2 diabetes mellitus ([place])   . Dementia ([place])   . Diabetes mellitus without complication ([place])   . Diverticulitis   . Hyperlipidemia   . Hypertension   . Osteoporosis   . PVD (peripheral vascular disease) ([place])   . Vitamin D deficiency     Social History   Socioeconomic History  . Marital status: Widowed    Spouse name: Not on file  . Number of children: Not on file  . Years of education: Not on file  . Highest education level: Not on file  Occupational History  . Not on file  Social Needs  . Financial resource strain: Not on file  . Food insecurity    Worry: Not on file    Inability: Not on file  . Transportation needs    Medical: Not on file    Non-medical: Not on file  Tobacco Use   . Smoking status: Not on file  Substance and Sexual Activity  . Alcohol use: Not on file  . Drug use: Not on file  . Sexual activity: Not on file  Lifestyle  . Physical activity    Days per week: Not on file    Minutes per session: Not on file  . Stress: Not on file  Relationships  . Social Herbalist on phone: Not on file    Gets together: Not on file    Attends religious service: Not on file    Active member of club or organization: Not on file    Attends meetings of clubs or organizations: Not on file    Relationship status: Not on file  . Intimate partner violence    Fear of current or ex partner: Not on file    Emotionally abused: Not on file    Physically abused: Not on file    Forced sexual activity: Not on file  Other Topics Concern  . Not on file  Social History Narrative  . Not on file    No family history on file.    Review of systems complete and found to be negative unless listed above      PHYSICAL EXAM  General: Well developed, well nourished, in no acute distress HEENT:  Normocephalic and atramatic Neck:  No JVD.  Lungs: Clear bilaterally to auscultation and percussion. Heart: HRRR . Normal S1 and S2 without gallops or murmurs.  Abdomen: Bowel sounds are positive, abdomen soft and non-tender  Msk:  Back normal, normal gait. Normal strength and tone for age. Extremities: No clubbing, cyanosis or edema.   Neuro: Alert and oriented X 3. Psych:  Good affect, responds appropriately  Labs:   Lab Results  Component Value Date   WBC 8.7 05/10/2019   HGB 11.8 (L) 05/10/2019   HCT 36.6 05/10/2019   MCV 89.9 05/10/2019   PLT 238 05/10/2019    Recent Labs  Lab 05/09/19 2128 05/10/19 0618  NA 139 140  K 4.3 4.1  CL 108 109  CO2 23 23  BUN 40* 34*  CREATININE 1.31* 1.06*  CALCIUM 9.0 9.0  PROT 7.3  --   BILITOT 0.7  --   ALKPHOS 62  --   ALT 10  --   AST 19  --   GLUCOSE 148* 193*   No results found for: CKTOTAL, CKMB,  CKMBINDEX, TROPONINI No results found for: CHOL No results found for: HDL No results found for: LDLCALC No results found for: TRIG No results found for: CHOLHDL No results found for: LDLDIRECT    Radiology: Ct Head Wo Contrast  Result Date: 05/09/2019 CLINICAL DATA:  Encephalopathy EXAM: CT HEAD WITHOUT CONTRAST TECHNIQUE: Contiguous axial images were obtained from the base of the skull through the vertex without intravenous contrast. COMPARISON:  None. FINDINGS: Brain: There is no mass, hemorrhage or extra-axial collection. The size and configuration of the ventricles and extra-axial CSF spaces are normal. 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 Brain parenchyma is otherwise normal. Vascular: No abnormal hyperdensity of the major intracranial arteries or dural venous sinuses. No intracranial atherosclerosis. Skull: The visualized skull base, calvarium and extracranial soft tissues are normal. Sinuses/Orbits: No fluid levels or advanced mucosal thickening of the visualized paranasal sinuses. No mastoid or middle ear effusion. The orbits are normal. IMPRESSION: 1. No acute intracranial abnormality. 2. 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 Electronically Signed   By: Ulyses Jarred M.D.   On: 05/09/2019 23:21   Dg Chest Portable 1 View  Result Date: 05/09/2019 CLINICAL DATA:  Malaise. EXAM: PORTABLE CHEST 1 VIEW COMPARISON:  None. FINDINGS: The heart size and mediastinal contours are within normal limits. Both lungs are clear. The visualized skeletal structures are unremarkable. Aortic calcifications are noted. IMPRESSION: No active disease. Electronically Signed   By: Constance Holster M.D.   On: 05/09/2019 21:49    EKG: Ectopic atrial rhythm at 36 bpm with right bundle branch block  ASSESSMENT AND PLAN:   1.  Chest pain, atypical, normal troponin, nondiagnostic ECG, currently chest pain-free 2.  Ectopic atrial bradycardia, initial heart rate 36 bpm, currently  50 to 70 bpm, of uncertain clinical significance 3.  Hypertensive urgency 4.  Generalized fatigue/malaise 5.  Dementia  Recommendations  1.  Agree with overall current therapy 2.  Defer full dose anticoagulation 3.  Defer invasive cardiac evaluation 4.  Avoid AV nodal blocking agents 5.  Review 2D echocardiogram 6.  Continue to closely monitor heart rate.  Unclear whether patient would benefit from permanent pacemaker implantation at this time.  Signed: Isaias Cowman MD,PhD, [facility] 05/10/2019, 8:56 AM

## 2019-05-10 NOTE — ED Notes (Signed)
Echo at bedside

## 2019-05-10 NOTE — Progress Notes (Signed)
*  PRELIMINARY RESULTS* Echocardiogram 2D Echocardiogram has been performed.  Martha Walker 05/10/2019, 4:32 PM

## 2019-05-10 NOTE — ED Notes (Signed)
Report given to telemetry RN 

## 2019-05-10 NOTE — Progress Notes (Signed)
PROGRESS NOTE    Martha Walker  [id number] DOB: 01-Feb-1932 DOA: 05/09/2019 PCP: Leonel Ramsay, MD      Brief Narrative:  Martha Walker is a 85 y.o. F with dementia, ALF dweling, HTN, and CKD IIIb baseline cr 1.2 who presented with dizziness and chest pressure.  In the ER, BP 227/50 and HR 38 and with ectopic atrial rhythm, bifascicular block.  Troponins negative.  She was given fluids, hydralazine and the hospitalist service were asked to evaluate.     Assessment & Plan:  Bradycardia Bifascicular block -Monitor on telemetry -Consult Cardiology, appreciate cares -Hold timolol   Chest pain Troponins negative, Cardiology have evaluated and recommend no further ischemic work up. -Obtain echo  Hypertensive urgency Hypotension Patient with severe range pressure, given IV hydralazine overnight.    Patient's home hydralazine and irbesartan were restarted this mroning, and she now has hypotension in the 70s/30s. -Hold hydralazine -Monitor BP  Pre-diabetes -Continue Crestor  Glaucoma -Continue eye drops -Hold timolol         MDM and disposition: The below labs and imaging reports were reviewed and summarized above.  Medication management as above.  The patient was admitted with bradycardia, dizziness.    She is normally ambulatory, but is currently weak and dizzy, unable to stand due to bradycardia.  These symptoms are persisting at 24 hours and she has had an episode of hypotension today.  For this reason, I believe continued inpatient services are reasonable and expected, and premature discharge would cause a unreasonable risk of adverse outcome, including readmission, debility or death         DVT prophylaxis: Lovenox Code Status: FULL Family Communication:     Consultants:   Cardiology  Procedures:   12/7 echo pending  Antimicrobials:       Subjective: Feeling weak, tired, dizzy.  No confusion, fever, vomitin.  Chest pain is resolved.   No dyspnea.   Objective: Vitals:   05/10/19 1215 05/10/19 1230 05/10/19 1245 05/10/19 1300  BP: (!) 111/44 (!) 112/47 (!) 135/52 (!) 126/48  Pulse: (!) 32  (!) 38   Resp: 15 14 11 19   Temp:      TempSrc:      SpO2: 98%  98%   Weight:      Height:        Intake/Output Summary (Last 24 hours) at 05/10/2019 1408 Last data filed at 05/10/2019 0117 Gross per 24 hour  Intake 500 ml  Output --  Net 500 ml   Filed Weights   05/09/19 2037  Weight: 86.2 kg    Examination: General appearance: overweight adult female, awake, appears tired, in reverse trendelenburg, no obvious distress.   HEENT: Anicteric, conjunctiva pink, lids and lashes normal. No nasal deformity, discharge, epistaxis.  Lips moist.   Skin: Warm and dry.  no jaundice.  No suspicious rashes or lesions. Cardiac: Slow regular, nl S1-S2, no murmurs appreciated.  Capillary refill is brisk.  JVP not visible.  No LE edema.  Radial pulses 2+ and symmetric. Respiratory: Normal respiratory rate and rhythm.  CTAB without rales or wheezes. Abdomen: Abdomen soft.  No TTP. No ascites, distension, hepatosplenomegaly.   MSK: No deformities or effusions. Neuro: Awake and responding to questions.  EOMI, moves all extremities with equal strength but globally very weak. Speech fluent.    Psych: Sensorium intact and responding to questions, attention normal but psychomotor slowing noted. Affect blunted.  Judgment and insight appear normal.    Data Reviewed: I have personally  reviewed following labs and imaging studies:  CBC: Recent Labs  Lab 05/09/19 2045 05/10/19 0618  WBC 6.4 8.7  HGB 11.3* 11.8*  HCT 34.1* 36.6  MCV 87.7 89.9  PLT 237 [id number]   Basic Metabolic Panel: Recent Labs  Lab 05/09/19 2128 05/10/19 0618  NA 139 140  K 4.3 4.1  CL 108 109  CO2 23 23  GLUCOSE 148* 193*  BUN 40* 34*  CREATININE 1.31* 1.06*  CALCIUM 9.0 9.0   GFR: Estimated Creatinine Clearance: 44.6 mL/min (A) (by C-G formula based on SCr of  1.06 mg/dL (H)). Liver Function Tests: Recent Labs  Lab 05/09/19 2128  AST 19  ALT 10  ALKPHOS 62  BILITOT 0.7  PROT 7.3  ALBUMIN 3.8   No results for input(s): LIPASE, AMYLASE in the last 168 hours. No results for input(s): AMMONIA in the last 168 hours. Coagulation Profile: No results for input(s): INR, PROTIME in the last 168 hours. Cardiac Enzymes: No results for input(s): CKTOTAL, CKMB, CKMBINDEX, TROPONINI in the last 168 hours. BNP (last 3 results) No results for input(s): PROBNP in the last 8760 hours. HbA1C: No results for input(s): HGBA1C in the last 72 hours. CBG: No results for input(s): GLUCAP in the last 168 hours. Lipid Profile: No results for input(s): CHOL, HDL, LDLCALC, TRIG, CHOLHDL, LDLDIRECT in the last 72 hours. Thyroid Function Tests: No results for input(s): TSH, T4TOTAL, FREET4, T3FREE, THYROIDAB in the last 72 hours. Anemia Panel: No results for input(s): VITAMINB12, FOLATE, FERRITIN, TIBC, IRON, RETICCTPCT in the last 72 hours. Urine analysis: No results found for: COLORURINE, APPEARANCEUR, LABSPEC, PHURINE, GLUCOSEU, HGBUR, BILIRUBINUR, KETONESUR, PROTEINUR, UROBILINOGEN, NITRITE, LEUKOCYTESUR Sepsis Labs: @LABRCNTIP (procalcitonin:4,lacticacidven:4)  ) Recent Results (from the past 240 hour(s))  SARS CORONAVIRUS 2 (TAT 6-24 HRS) Nasopharyngeal Nasopharyngeal Swab     Status: None   Collection Time: 05/09/19 11:36 PM   Specimen: Nasopharyngeal Swab  Result Value Ref Range Status   SARS Coronavirus 2 NEGATIVE NEGATIVE Final    Comment: (NOTE) SARS-CoV-2 target nucleic acids are NOT DETECTED. The SARS-CoV-2 RNA is generally detectable in upper and lower respiratory specimens during the acute phase of infection. Negative results do not preclude SARS-CoV-2 infection, do not rule out co-infections with other pathogens, and should not be used as the sole basis for treatment or other patient management decisions. Negative results must be combined  with clinical observations, patient history, and epidemiological information. The expected result is Negative. Fact Sheet for Patients: SugarRoll.be Fact Sheet for Healthcare Providers: https://www.woods-mathews.com/ This test is not yet approved or cleared by the [place] FDA and  has been authorized for detection and/or diagnosis of SARS-CoV-2 by FDA under an Emergency Use Authorization (EUA). This EUA will remain  in effect (meaning this test can be used) for the duration of the COVID-19 declaration under Section 56 4(b)(1) of the Act, 21 U.S.C. section 360bbb-3(b)(1), unless the authorization is terminated or revoked sooner. Performed at [facility], [place] [address] [place], [place] [address]          Radiology Studies: Ct Head Wo Contrast  Result Date: 05/09/2019 CLINICAL DATA:  Encephalopathy EXAM: CT HEAD WITHOUT CONTRAST TECHNIQUE: Contiguous axial images were obtained from the base of the skull through the vertex without intravenous contrast. COMPARISON:  None. FINDINGS: Brain: There is no mass, hemorrhage or extra-axial collection. The size and configuration of the ventricles and extra-axial CSF spaces are normal. 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 Brain parenchyma is  otherwise normal. Vascular: No abnormal hyperdensity of the major intracranial arteries or dural venous sinuses. No intracranial atherosclerosis. Skull: The visualized skull base, calvarium and extracranial soft tissues are normal. Sinuses/Orbits: No fluid levels or advanced mucosal thickening of the visualized paranasal sinuses. No mastoid or middle ear effusion. The orbits are normal. IMPRESSION: 1. No acute intracranial abnormality. 2. 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 Electronically Signed   By: Ulyses Jarred M.D.   On: 05/09/2019 23:21   Dg Chest Portable 1 View  Result Date: 05/09/2019 CLINICAL DATA:  Malaise.  EXAM: PORTABLE CHEST 1 VIEW COMPARISON:  None. FINDINGS: The heart size and mediastinal contours are within normal limits. Both lungs are clear. The visualized skeletal structures are unremarkable. Aortic calcifications are noted. IMPRESSION: No active disease. Electronically Signed   By: Constance Holster M.D.   On: 05/09/2019 21:49        Scheduled Meds:  brimonidine  1 drop Both Eyes BID   dorzolamide-timolol  1 drop Both Eyes BID   enoxaparin (LOVENOX) injection  40 mg Subcutaneous Q24H   irbesartan  75 mg Oral Daily   latanoprost  1 drop Both Eyes QHS   Netarsudil Dimesylate  1 drop Right Eye QHS   rosuvastatin  5 mg Oral Daily   Continuous Infusions:   LOS: 0 days    Time spent: 25 minutes    Edwin Dada, MD [facility] 05/10/2019, 2:08 PM     Please page though [place] or Epic secure chat:  For [employer], Adult nurse

## 2019-05-10 NOTE — ED Notes (Addendum)
Dr. Loleta Books & Dr. Aida Puffer informed of bradycardia between 38-44

## 2019-05-10 NOTE — ED Notes (Signed)
Informed pt's daughter Manuela Schwartz on pt's status and plan of care to the best of this RN's ability

## 2019-05-10 NOTE — ED Notes (Signed)
Dr. Loleta Books informed of continued low BP and bradycardia, BP currently 85/41, HR 38

## 2019-05-11 DIAGNOSIS — R079 Chest pain, unspecified: Secondary | ICD-10-CM | POA: Diagnosis not present

## 2019-05-11 DIAGNOSIS — R001 Bradycardia, unspecified: Secondary | ICD-10-CM | POA: Diagnosis not present

## 2019-05-11 LAB — CBC
HCT: 32.6 % — ABNORMAL LOW (ref 36.0–46.0)
Hemoglobin: 10.4 g/dL — ABNORMAL LOW (ref 12.0–15.0)
MCH: 28.9 pg (ref 26.0–34.0)
MCHC: 31.9 g/dL (ref 30.0–36.0)
MCV: 90.6 fL (ref 80.0–100.0)
Platelets: 242 10*3/uL (ref 150–400)
RBC: 3.6 MIL/uL — ABNORMAL LOW (ref 3.87–5.11)
RDW: 12.7 % (ref 11.5–15.5)
WBC: 5.7 10*3/uL (ref 4.0–10.5)
nRBC: 0 % (ref 0.0–0.2)

## 2019-05-11 LAB — BASIC METABOLIC PANEL
Anion gap: 8 (ref 5–15)
BUN: 29 mg/dL — ABNORMAL HIGH (ref 8–23)
CO2: 23 mmol/L (ref 22–32)
Calcium: 9.1 mg/dL (ref 8.9–10.3)
Chloride: 110 mmol/L (ref 98–111)
Creatinine, Ser: 1.18 mg/dL — ABNORMAL HIGH (ref 0.44–1.00)
GFR calc Af Amer: 49 mL/min — ABNORMAL LOW (ref >60)
GFR calc non Af Amer: 42 mL/min — ABNORMAL LOW (ref >60)
Glucose, Bld: 153 mg/dL — ABNORMAL HIGH (ref 70–99)
Potassium: 4.3 mmol/L (ref 3.5–5.1)
Sodium: 141 mmol/L (ref 135–145)

## 2019-05-11 LAB — ECHOCARDIOGRAM COMPLETE
Height: 68 in
Weight: 3040 oz

## 2019-05-11 MED ORDER — HYDRALAZINE HCL 50 MG PO TABS
50.0000 mg | ORAL_TABLET | Freq: Two times a day (BID) | ORAL | Status: DC
  Administered 2019-05-11: 12:00:00 50 mg via ORAL
  Filled 2019-05-11: qty 1

## 2019-05-11 NOTE — NC FL2 (Signed)
[place] LEVEL OF CARE SCREENING TOOL     IDENTIFICATION  Patient Name: Martha Walker Birthdate: [birth year] Sex: female Admission Date (Current Location): 05/09/2019  [facility] and [place] Number:  Engineering geologist and Address:  [facility], [address] [place], [place] [address]      Provider Number: [id number]  Attending Physician Name and Address:  Edwin Dada, *  Relative Name and Phone Number:       Current Level of Care: Hospital Recommended Level of Care: [place] Prior Approval Number:    Date Approved/Denied:   PASRR Number:    Discharge Plan: Other (Comment)(ALF)    Current Diagnoses: Patient Active Problem List   Diagnosis Date Noted  . Bradycardia 05/10/2019  . Chronic anemia   . Chest pain 05/09/2019    Orientation RESPIRATION BLADDER Height & Weight     Self, Time, Place  Normal Continent Weight: 161 lb 6.4 oz (73.2 kg) Height:  5\' 8"  (172.7 cm)  BEHAVIORAL SYMPTOMS/MOOD NEUROLOGICAL BOWEL NUTRITION STATUS  (None) (None) Continent Diet(Low sodium heart healthy)  AMBULATORY STATUS COMMUNICATION OF NEEDS Skin   Independent Verbally Normal                       Personal Care Assistance Level of Assistance              Functional Limitations Info  Sight, Hearing, Speech Sight Info: Adequate Hearing Info: Adequate Speech Info: Adequate    SPECIAL CARE FACTORS FREQUENCY                       Contractures Contractures Info: Not present    Additional Factors Info  Code Status, Allergies Code Status Info: Full code Allergies Info: Augmentin (Amoxicillin-pot Clavulanate), Bactrim (Sulfamethoxazole-trimethoprim), Etodolac, Fosinopril, Ibudone (Hydrocodone-ibuprofen), Januvia (Sitagliptin), Lipitor (Atorvastatin Calcium), Lotrel (Amlodipine Besy-benazepril Hcl), Metronidazole, Mobic (Meloxicam), Penicillins, Pravastatin, Simvastatin, Vicodin  (Hydrocodone-acetaminophen), Tylenol (Acetaminophen)           Current Medications (05/11/2019):  This is the current hospital active medication list Current Facility-Administered Medications  Medication Dose Route Frequency Provider Last Rate Last Dose  . brimonidine (ALPHAGAN) 0.2 % ophthalmic solution 1 drop  1 drop Both Eyes BID Edwin Dada, MD   1 drop at 05/11/19 0957  . enoxaparin (LOVENOX) injection 40 mg  40 mg Subcutaneous Q24H Mansy, Jan A, MD   40 mg at 05/11/19 0604  . hydrALAZINE (APRESOLINE) tablet 25 mg  25 mg Oral Q6H PRN Edwin Dada, MD   25 mg at 05/10/19 1901  . hydrALAZINE (APRESOLINE) tablet 50 mg  50 mg Oral BID Edwin Dada, MD   50 mg at 05/11/19 1205  . irbesartan (AVAPRO) tablet 75 mg  75 mg Oral Daily Edwin Dada, MD   75 mg at 05/11/19 0956  . latanoprost (XALATAN) 0.005 % ophthalmic solution 1 drop  1 drop Both Eyes QHS Danford, Suann Larry, MD   1 drop at 05/10/19 2123  . Netarsudil Dimesylate 0.02 % SOLN 1 drop  1 drop Right Eye QHS Danford, Suann Larry, MD      . ondansetron [facility]) injection 4 mg  4 mg Intravenous Q6H PRN Mansy, Jan A, MD      . rosuvastatin (CRESTOR) tablet 5 mg  5 mg Oral Daily Danford, Suann Larry, MD   5 mg at 05/11/19 0957  . sodium chloride flush (NS) 0.9 % injection 10 mL  10 mL Intravenous Q12H Edwin Dada, MD   10 mL at 05/11/19 0957     Discharge Medications: STOP taking these medications   dorzolamide-timolol 22.3-6.8 MG/ML ophthalmic solution Commonly known as: COSOPT     TAKE these medications   Antacid 200-200-20 MG/5ML suspension Generic drug: alum & mag hydroxide-simeth Take 30 mLs by mouth every 6 (six) hours as needed for indigestion or heartburn.   brimonidine 0.2 % ophthalmic solution Commonly known as: ALPHAGAN Place 1 drop into both eyes 2 (two) times daily.   cetirizine 10 MG tablet Commonly known as: ZYRTEC Take 10 mg by mouth daily.    conjugated estrogens vaginal cream Commonly known as: PREMARIN Place 1 Applicatorful vaginally as directed. Twice weekly   D[place] Crea Apply topically.   diclofenac Sodium 1 % Gel Commonly known as: VOLTAREN Apply 2 g topically 4 (four) times daily as needed.   DIPHENHIST PO Take 25 mg by mouth every 6 (six) hours as needed.   docusate sodium 100 MG capsule Commonly known as: COLACE Take 100 mg by mouth daily.   ferrous sulfate 325 (65 FE) MG tablet Take 325 mg by mouth 3 (three) times daily with meals.   guaiFENesin 100 MG/5ML liquid Commonly known as: ROBITUSSIN Take 200 mg by mouth 3 (three) times daily as needed for cough.   hydrALAZINE 50 MG tablet Commonly known as: APRESOLINE Take 50 mg by mouth 2 (two) times daily.   latanoprost 0.005 % ophthalmic solution Commonly known as: XALATAN Place 1 drop into both eyes at bedtime.   loperamide 2 MG capsule Commonly known as: IMODIUM Take 2 mg by mouth as needed for diarrhea or loose stools.   magnesium hydroxide 400 MG/5ML suspension Commonly known as: MILK OF MAGNESIA Take 30 mLs by mouth daily as needed for mild constipation.   Rhopressa 0.02 % Soln Generic drug: Netarsudil Dimesylate Place 1 drop into the right eye at bedtime.   rosuvastatin 5 MG tablet Commonly known as: CRESTOR Take 5 mg by mouth daily.   valsartan 80 MG tablet Commonly known as: DIOVAN Take 80 mg by mouth daily.   vitamin B-12 1000 MCG tablet Commonly known as: CYANOCOBALAMIN Take 1,000 mcg by mouth daily.   VITAMIN D PO Take 1,250 Units by mouth daily.     Relevant Imaging Results:  Relevant Lab Results:   Additional Information SS#: [id number]  Candie Chroman, LCSW

## 2019-05-11 NOTE — Progress Notes (Signed)
Patient discharged back to [facility]. Packet in belongings, sent with patient, IV removed.

## 2019-05-11 NOTE — Plan of Care (Signed)
  Problem: Clinical Measurements: Goal: Ability to maintain clinical measurements within normal limits will improve Outcome: Progressing Goal: Respiratory complications will improve Outcome: Progressing Goal: Cardiovascular complication will be avoided Outcome: Progressing   Problem: Activity: Goal: Risk for activity intolerance will decrease Outcome: Progressing   Problem: Safety: Goal: Ability to remain free from injury will improve Outcome: Not Progressing   Problem: Pain Managment: Goal: General experience of comfort will improve Outcome: Completed/Met

## 2019-05-11 NOTE — Evaluation (Signed)
Physical Therapy Evaluation Patient Details Name: Martha Walker MRN: [id number] DOB: [birth year] Today's Date: 05/11/2019   History of Present Illness  Per MD H&P: Pt is an 85 y.o. female with a known history of type diabetes mellitus, dyslipidemia, dementia, hypertension and diabetic neuropathy, who presented to the emergency room with acute onset of generalized fatigue and malaise with associated lightheadedness and dizziness as well as dyspnea.  She admitted to chest pressure that she graded 3-4/10 in severity and the midsternal area no cough or wheezing or hemoptysis or fever or chills.  MD assessment includes: bradycardia, bifascicular block, chest pain, HTN, hypotension, pre-diabetes, and glaucoma.    Clinical Impression  Pt presented with min deficits in activity tolerance and gait but overall performed very well during the session.  Pt's baseline SpO2/HR on room air were 97%/55 bpm.  After amb 100': 99%/76 bpm.  After amb 150' and ascending/descending 5 stairs: 98%/110 bpm with min/mod SOB.  After sitting around 2 min SpO2 97% and HR 63 bpm with no SOB.  Will keep pt on PT caseload while in acute care to prevent deconditioning but no skilled PT services recommended upon discharge with pt education provided on physiological benefits of amb and on amb progression.      Follow Up Recommendations No PT follow up    Equipment Recommendations  None recommended by PT    Recommendations for Other Services       Precautions / Restrictions Precautions Precautions: Fall Restrictions Weight Bearing Restrictions: No      Mobility  Bed Mobility Overal bed mobility: Independent                Transfers Overall transfer level: Independent                  Ambulation/Gait Ambulation/Gait assistance: Independent Gait Distance (Feet): 150 Feet Assistive device: None Gait Pattern/deviations: Step-through pattern;Decreased step length - right;Decreased step length - left Gait  velocity: decreased   General Gait Details: Min decreased cadence but steady without LOB including during dynamic gait with start/stops and head turns  Stairs Stairs: Yes Stairs assistance: Supervision Stair Management: One rail Right Number of Stairs: 5 General stair comments: Good eccentric and concentric control ascending and descending steps  Wheelchair Mobility    Modified Rankin (Stroke Patients Only)       Balance Overall balance assessment: No apparent balance deficits (not formally assessed)                                           Pertinent Vitals/Pain Pain Assessment: No/denies pain    Home Living Family/patient expects to be discharged to:: Group home with 5 steps to enter with R rail.                        Prior Function Level of Independence: Needs assistance   Gait / Transfers Assistance Needed: Pt Ind with amb community distances without an AD, no fall history  ADL's / Homemaking Assistance Needed: Pt Ind with bathing and dressing with group home staff assisting with meals and medication management        Hand Dominance        Extremity/Trunk Assessment   Upper Extremity Assessment Upper Extremity Assessment: Overall WFL for tasks assessed    Lower Extremity Assessment Lower Extremity Assessment: Overall WFL for tasks assessed  Communication   Communication: No difficulties  Cognition Arousal/Alertness: Awake/alert Behavior During Therapy: WFL for tasks assessed/performed Overall Cognitive Status: No family/caregiver present to determine baseline cognitive functioning                                 General Comments: Pt required extra time to consider/process questions while providing history and was able to follow 1-step commands consistently      General Comments      Exercises     Assessment/Plan    PT Assessment Patient needs continued PT services  PT Problem List Decreased  activity tolerance       PT Treatment Interventions Gait training;Therapeutic exercise;Patient/family education;Stair training    PT Goals (Current goals can be found in the Care Plan section)  Acute Rehab PT Goals Patient Stated Goal: To get my strength back PT Goal Formulation: With patient Time For Goal Achievement: 05/24/19 Potential to Achieve Goals: Good    Frequency Min 2X/week   Barriers to discharge        Co-evaluation               AM-PAC PT "6 Clicks" Mobility  Outcome Measure Help needed turning from your back to your side while in a flat bed without using bedrails?: None Help needed moving from lying on your back to sitting on the side of a flat bed without using bedrails?: None Help needed moving to and from a bed to a chair (including a wheelchair)?: None Help needed standing up from a chair using your arms (e.g., wheelchair or bedside chair)?: None Help needed to walk in hospital room?: None Help needed climbing 3-5 steps with a railing? : None 6 Click Score: 24    End of Session Equipment Utilized During Treatment: Gait belt Activity Tolerance: Patient tolerated treatment well Patient left: in chair;with chair alarm set;with call bell/phone within reach Nurse Communication: Mobility status PT Visit Diagnosis: Difficulty in walking, not elsewhere classified (R26.2)    Time: 1000-1028 PT Time Calculation (min) (ACUTE ONLY): 28 min   Charges:   PT Evaluation $PT Eval Low Complexity: 1 Low PT Treatments $Gait Training: 8-22 mins        D. Scott Kahner Yanik PT, DPT 05/11/19, 12:09 PM

## 2019-05-11 NOTE — Discharge Summary (Signed)
Physician Discharge Summary  Martha Walker [id number] DOB: 07-15-1931 DOA: 05/09/2019  PCP: Leonel Ramsay, MD  Admit date: 05/09/2019 Discharge date: 05/11/2019  Admitted From: ALF  Disposition:  ALF   Recommendations for Outpatient Follow-up:  1. Follow up with Cardiology in 2-3 weeks   Home Health: None  Equipment/Devices: Holter monitor  Discharge Condition: Fair  CODE STATUS: FULL Diet recommendation: Cardiac  Brief/Interim Summary: Mrs. Martha Walker is a 85 y.o. F with dementia, ALF dweling, HTN, and CKD IIIb baseline cr 1.2 who presented with dizziness and chest pressure.  In the ER, BP 227/50 and HR 38 and with ectopic atrial rhythm, bifascicular block.  Troponins negative.  She was given fluids, hydralazine and the hospitalist service were asked to evaluate.     PRINCIPAL HOSPITAL DIAGNOSIS: Bradycardia    Discharge Diagnoses:   Bradycardia Bifascicular block Patient was admitted and monitored on telemetry.  Cardiology were consulted who evaluated the patient and recommended outpatient Holter monitor.    Hold timolol eye drops, systemic absorption is low, but has been reported.  Follow up with Dr. Nehemiah Massed.     Chest pain Troponins negative, Cardiology have evaluated and recommend no further ischemic work up.  Echocardiogram showed normal EF.  Hypertensive urgency Hypotension Patient with severe range pressure on admission, given IV hydralazine overnight.    Patient's home hydralazine and irbesartan were restarted in the morning, and she developed hypotension in the 70s/30s, suspect this was due to cumulative doses hydralazine.  This hypotension resolved.  Pre-diabetes  Glaucoma          Discharge Instructions  Discharge Instructions    Diet - low sodium heart healthy   Complete by: As directed    Discharge instructions   Complete by: As directed    You were admitted for high blood pressure and chest pain.  Dr. Alveria Apley  partner and I were able to rule out that your symptoms were from a heart attack.  We did notice that you have a very low heart rate.  In order to decide if you need a pacemaker, we will arrange for a Holter monitor (a wearable heart rate monitor) for a while.  Then follow up with Dr. Nehemiah Massed.  In the meantime, continue your blood pressure medicines, and STOP taking timolol (Cosopt), because this has an ingredient that can slow the heart rate (talk to your eye doctor to let them know this was stopped)   Increase activity slowly   Complete by: As directed      Allergies as of 05/11/2019      Reactions   Augmentin [amoxicillin-pot Clavulanate]    Bactrim [sulfamethoxazole-trimethoprim]    Etodolac    Fosinopril    Ibudone [hydrocodone-ibuprofen]    Januvia [sitagliptin]    Lipitor [atorvastatin Calcium]    Lotrel [amlodipine Besy-benazepril Hcl]    Metronidazole    Mobic [meloxicam]    Penicillins    Pravastatin    Simvastatin    Vicodin [hydrocodone-acetaminophen]    Tylenol [acetaminophen] Rash      Medication List    STOP taking these medications   dorzolamide-timolol 22.3-6.8 MG/ML ophthalmic solution Commonly known as: COSOPT     TAKE these medications   Antacid 200-200-20 MG/5ML suspension Generic drug: alum & mag hydroxide-simeth Take 30 mLs by mouth every 6 (six) hours as needed for indigestion or heartburn.   brimonidine 0.2 % ophthalmic solution Commonly known as: ALPHAGAN Place 1 drop into both eyes 2 (two) times daily.   cetirizine 10 MG  tablet Commonly known as: ZYRTEC Take 10 mg by mouth daily.   conjugated estrogens vaginal cream Commonly known as: PREMARIN Place 1 Applicatorful vaginally as directed. Twice weekly   Dermacloud Crea Apply topically.   diclofenac Sodium 1 % Gel Commonly known as: VOLTAREN Apply 2 g topically 4 (four) times daily as needed.   DIPHENHIST PO Take 25 mg by mouth every 6 (six) hours as needed.   docusate sodium  100 MG capsule Commonly known as: COLACE Take 100 mg by mouth daily.   ferrous sulfate 325 (65 FE) MG tablet Take 325 mg by mouth 3 (three) times daily with meals.   guaiFENesin 100 MG/5ML liquid Commonly known as: ROBITUSSIN Take 200 mg by mouth 3 (three) times daily as needed for cough.   hydrALAZINE 50 MG tablet Commonly known as: APRESOLINE Take 50 mg by mouth 2 (two) times daily.   latanoprost 0.005 % ophthalmic solution Commonly known as: XALATAN Place 1 drop into both eyes at bedtime.   loperamide 2 MG capsule Commonly known as: IMODIUM Take 2 mg by mouth as needed for diarrhea or loose stools.   magnesium hydroxide 400 MG/5ML suspension Commonly known as: MILK OF MAGNESIA Take 30 mLs by mouth daily as needed for mild constipation.   Rhopressa 0.02 % Soln Generic drug: Netarsudil Dimesylate Place 1 drop into the right eye at bedtime.   rosuvastatin 5 MG tablet Commonly known as: CRESTOR Take 5 mg by mouth daily.   valsartan 80 MG tablet Commonly known as: DIOVAN Take 80 mg by mouth daily.   vitamin B-12 1000 MCG tablet Commonly known as: CYANOCOBALAMIN Take 1,000 mcg by mouth daily.   VITAMIN D PO Take 1,250 Units by mouth daily.      Follow-up Information    Corey Skains, MD. Schedule an appointment as soon as possible for a visit in 1 week(s).   Specialty: Cardiology Contact information: [facility] [place] [address] [phone]          Allergies  Allergen Reactions  . Augmentin [Amoxicillin-Pot Clavulanate]   . Bactrim [Sulfamethoxazole-Trimethoprim]   . Etodolac   . Fosinopril   . Ibudone [Hydrocodone-Ibuprofen]   . Januvia [Sitagliptin]   . Lipitor [Atorvastatin Calcium]   . Lotrel [Amlodipine Besy-Benazepril Hcl]   . Metronidazole   . Mobic [Meloxicam]   . Penicillins   . Pravastatin   . Simvastatin   . Vicodin [Hydrocodone-Acetaminophen]   . Tylenol [Acetaminophen] Rash     Consultations:  Cardiology   Procedures/Studies: Ct Head Wo Contrast  Result Date: 05/09/2019 CLINICAL DATA:  Encephalopathy EXAM: CT HEAD WITHOUT CONTRAST TECHNIQUE: Contiguous axial images were obtained from the base of the skull through the vertex without intravenous contrast. COMPARISON:  None. FINDINGS: Brain: There is no mass, hemorrhage or extra-axial collection. The size and configuration of the ventricles and extra-axial CSF spaces are normal. 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 Brain parenchyma is otherwise normal. Vascular: No abnormal hyperdensity of the major intracranial arteries or dural venous sinuses. No intracranial atherosclerosis. Skull: The visualized skull base, calvarium and extracranial soft tissues are normal. Sinuses/Orbits: No fluid levels or advanced mucosal thickening of the visualized paranasal sinuses. No mastoid or middle ear effusion. The orbits are normal. IMPRESSION: 1. No acute intracranial abnormality. 2. 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 Electronically Signed   By: Ulyses Jarred M.D.   On: 05/09/2019 23:21   Dg Chest Portable 1  View  Result Date: 05/09/2019 CLINICAL DATA:  Malaise. EXAM: PORTABLE CHEST 1 VIEW COMPARISON:  None. FINDINGS: The heart size and mediastinal contours are within normal limits. Both lungs are clear. The visualized skeletal structures are unremarkable. Aortic calcifications are noted. IMPRESSION: No active disease. Electronically Signed   By: Constance Holster M.D.   On: 05/09/2019 21:49       Subjective: No dizziness, chest pain.  Able to ambulate to the bathroom with assistance.  No fever, cough, dyspnea.  No syncope.  Discharge Exam: Vitals:   05/11/19 0610 05/11/19 0747  BP: (!) 152/49 (!) 170/58  Pulse: (!) 59 (!) 55  Resp:  19  Temp:  98.7 F (37.1 C)  SpO2: 98% 96%   Vitals:   05/11/19 0605 05/11/19 0608 05/11/19 0610 05/11/19 0747  BP:  (!) 169/47 (!) 152/49 (!)  170/58  Pulse:  (!) 43 (!) 59 (!) 55  Resp:  19  19  Temp:  98.2 F (36.8 C)  98.7 F (37.1 C)  TempSrc:  Oral    SpO2:  99% 98% 96%  Weight: 73.2 kg     Height:        General: Pt is alert, awake, not in acute distress, lying in bed Cardiovascular: Heart rate slow, nl S1-S2, no murmurs appreciated.   No LE edema.   Respiratory: Normal respiratory rate and rhythm.  CTAB without rales or wheezes. Abdominal: Abdomen soft and non-tender.  No distension or HSM.   Neuro/Psych: Strength symmetric in upper and lower extremities.  Judgment and insight appear moderately impaired by dementia.   The results of significant diagnostics from this hospitalization (including imaging, microbiology, ancillary and laboratory) are listed below for reference.     Microbiology: Recent Results (from the past 240 hour(s))  SARS CORONAVIRUS 2 (TAT 6-24 HRS) Nasopharyngeal Nasopharyngeal Swab     Status: None   Collection Time: 05/09/19 11:36 PM   Specimen: Nasopharyngeal Swab  Result Value Ref Range Status   SARS Coronavirus 2 NEGATIVE NEGATIVE Final    Comment: (NOTE) SARS-CoV-2 target nucleic acids are NOT DETECTED. The SARS-CoV-2 RNA is generally detectable in upper and lower respiratory specimens during the acute phase of infection. Negative results do not preclude SARS-CoV-2 infection, do not rule out co-infections with other pathogens, and should not be used as the sole basis for treatment or other patient management decisions. Negative results must be combined with clinical observations, patient history, and epidemiological information. The expected result is Negative. Fact Sheet for Patients: SugarRoll.be Fact Sheet for Healthcare Providers: https://www.woods-mathews.com/ This test is not yet approved or cleared by the [place] FDA and  has been authorized for detection and/or diagnosis of SARS-CoV-2 by FDA under an Emergency Use Authorization  (EUA). This EUA will remain  in effect (meaning this test can be used) for the duration of the COVID-19 declaration under Section 56 4(b)(1) of the Act, 21 U.S.C. section 360bbb-3(b)(1), unless the authorization is terminated or revoked sooner. Performed at [facility], [place] [address] [place], [place] [address]      Labs: BNP (last 3 results) No results for input(s): BNP in the last 8760 hours. Basic Metabolic Panel: Recent Labs  Lab 05/09/19 2128 05/10/19 0618 05/11/19 0727  NA 139 140 141  K 4.3 4.1 4.3  CL 108 109 110  CO2 23 23 23   GLUCOSE 148* 193* 153*  BUN 40* 34* 29*  CREATININE 1.31* 1.06* 1.18*  CALCIUM 9.0 9.0 9.1   Liver Function Tests:  Recent Labs  Lab 05/09/19 2128  AST 19  ALT 10  ALKPHOS 62  BILITOT 0.7  PROT 7.3  ALBUMIN 3.8   No results for input(s): LIPASE, AMYLASE in the last 168 hours. No results for input(s): AMMONIA in the last 168 hours. CBC: Recent Labs  Lab 05/09/19 2045 05/10/19 0618 05/11/19 0727  WBC 6.4 8.7 5.7  HGB 11.3* 11.8* 10.4*  HCT 34.1* 36.6 32.6*  MCV 87.7 89.9 90.6  PLT 237 238 242   Cardiac Enzymes: No results for input(s): CKTOTAL, CKMB, CKMBINDEX, TROPONINI in the last 168 hours. BNP: Invalid input(s): POCBNP CBG: No results for input(s): GLUCAP in the last 168 hours. D-Dimer No results for input(s): DDIMER in the last 72 hours. Hgb A1c No results for input(s): HGBA1C in the last 72 hours. Lipid Profile No results for input(s): CHOL, HDL, LDLCALC, TRIG, CHOLHDL, LDLDIRECT in the last 72 hours. Thyroid function studies No results for input(s): TSH, T4TOTAL, T3FREE, THYROIDAB in the last 72 hours.  Invalid input(s): FREET3 Anemia work up No results for input(s): VITAMINB12, FOLATE, FERRITIN, TIBC, IRON, RETICCTPCT in the last 72 hours. Urinalysis No results found for: COLORURINE, APPEARANCEUR, [place], [place], [place], [place], [place], [place], PROTEINUR, UROBILINOGEN, NITRITE,  LEUKOCYTESUR Sepsis Labs Invalid input(s): PROCALCITONIN,  WBC,  LACTICIDVEN Microbiology Recent Results (from the past 240 hour(s))  SARS CORONAVIRUS 2 (TAT 6-24 HRS) Nasopharyngeal Nasopharyngeal Swab     Status: None   Collection Time: 05/09/19 11:36 PM   Specimen: Nasopharyngeal Swab  Result Value Ref Range Status   SARS Coronavirus 2 NEGATIVE NEGATIVE Final    Comment: (NOTE) SARS-CoV-2 target nucleic acids are NOT DETECTED. The SARS-CoV-2 RNA is generally detectable in upper and lower respiratory specimens during the acute phase of infection. Negative results do not preclude SARS-CoV-2 infection, do not rule out co-infections with other pathogens, and should not be used as the sole basis for treatment or other patient management decisions. Negative results must be combined with clinical observations, patient history, and epidemiological information. The expected result is Negative. Fact Sheet for Patients: SugarRoll.be Fact Sheet for Healthcare Providers: https://www.woods-mathews.com/ This test is not yet approved or cleared by the [place] FDA and  has been authorized for detection and/or diagnosis of SARS-CoV-2 by FDA under an Emergency Use Authorization (EUA). This EUA will remain  in effect (meaning this test can be used) for the duration of the COVID-19 declaration under Section 56 4(b)(1) of the Act, 21 U.S.C. section 360bbb-3(b)(1), unless the authorization is terminated or revoked sooner. Performed at [facility], [place] [address], [place], [place] [address]      Time coordinating discharge: 35 minutes The [place] controlled substances registry was reviewed for this patient.      SIGNED:   Edwin Dada, MD  [facility] 05/11/2019, 10:58 AM

## 2019-05-11 NOTE — TOC Transition Note (Signed)
Transition of Care [facility]) - CM/SW Discharge Note   Patient Details  Name: Martha Walker MRN: [id number] Date of Birth: [birth year]  [facility] CM/SW Contact:  Candie Chroman, LCSW Phone Number: 05/11/2019, 1:03 PM   Clinical Narrative:  Patient has orders to discharge back to [facility] today. CSW faxed discharge summary and FL2 to ALF for review. They will pick her up around 2:00 today. Patient, daughter, and RN aware. No further concerns. CSW signing off.   Final next level of care: Assisted Living Barriers to Discharge: Barriers Resolved   Patient Goals and CMS Choice     Choice offered to / list presented to : NA  Discharge Placement                       Discharge Plan and Services     Post Acute Care Choice: NA                               Social Determinants of Health (SDOH) Interventions     Readmission Risk Interventions No flowsheet data found.

## 2019-06-09 ENCOUNTER — Other Ambulatory Visit: Payer: Self-pay | Admitting: Nurse Practitioner

## 2019-06-09 DIAGNOSIS — U071 COVID-19: Secondary | ICD-10-CM

## 2019-06-09 NOTE — Progress Notes (Signed)
  I connected by phone with Martha Walker on 06/09/2019 at 8:16 AM to discuss the potential use of an new treatment for mild to moderate COVID-19 viral infection in non-hospitalized patients.  This patient is a 86 y.o. female that meets the FDA criteria for Emergency Use Authorization of bamlanivimab or casirivimab\imdevimab.  Has a (+) direct SARS-CoV-2 viral test result  Has mild or moderate COVID-19   Is ? 86 years of age and weighs ? 40 kg  Is NOT hospitalized due to COVID-19  Is NOT requiring oxygen therapy or requiring an increase in baseline oxygen flow rate due to COVID-19  Is within 10 days of symptom onset  Has at least one of the high risk factor(s) for progression to severe COVID-19 and/or hospitalization as defined in EUA.  Specific high risk criteria : >/= 84 yo   I have spoken and communicated the following to the patient or parent/caregiver:  1. FDA has authorized the emergency use of bamlanivimab and casirivimab\imdevimab for the treatment of mild to moderate COVID-19 in adults and pediatric patients with positive results of direct SARS-CoV-2 viral testing who are 45 years of age and older weighing at least 40 kg, and who are at high risk for progressing to severe COVID-19 and/or hospitalization.  2. The significant known and potential risks and benefits of bamlanivimab and casirivimab\imdevimab, and the extent to which such potential risks and benefits are unknown.  3. Information on available alternative treatments and the risks and benefits of those alternatives, including clinical trials.  4. Patients treated with bamlanivimab and casirivimab\imdevimab should continue to self-isolate and use infection control measures (e.g., wear mask, isolate, social distance, avoid sharing personal items, clean and disinfect "high touch" surfaces, and frequent handwashing) according to CDC guidelines.   5. The patient or parent/caregiver has the option to accept or refuse  bamlanivimab or casirivimab\imdevimab .  After reviewing this information with the patient, The patient agreed to proceed with receiving the bamlanimivab infusion and will be provided a copy of the Fact sheet prior to receiving the infusion.Fenton Foy 06/09/2019 8:16 AM  Patient will need a copy of positive COVID test scanned into chart before infusion.

## 2019-06-11 ENCOUNTER — Ambulatory Visit (HOSPITAL_COMMUNITY)
Admission: RE | Admit: 2019-06-11 | Discharge: 2019-06-11 | Disposition: A | Discharge status: Home / Self Care | Payer: Medicare Other | Source: Ambulatory Visit | Attending: Pulmonary Disease | Admitting: Pulmonary Disease

## 2019-06-11 DIAGNOSIS — Z23 Encounter for immunization: Secondary | ICD-10-CM | POA: Diagnosis not present

## 2019-06-11 DIAGNOSIS — U071 COVID-19: Secondary | ICD-10-CM

## 2019-06-11 MED ORDER — SODIUM CHLORIDE 0.9 % IV SOLN
INTRAVENOUS | Status: DC | PRN
  Administered 2019-06-11: 17:00:00 250 mL via INTRAVENOUS

## 2019-06-11 MED ORDER — FAMOTIDINE IN NACL 20-0.9 MG/50ML-% IV SOLN: 20.0000 mg | Freq: Once | INTRAVENOUS | Status: DC | PRN

## 2019-06-11 MED ORDER — DIPHENHYDRAMINE HCL 50 MG/ML IJ SOLN: 50.0000 mg | Freq: Once | INTRAMUSCULAR | Status: DC | PRN

## 2019-06-11 MED ORDER — METHYLPREDNISOLONE SODIUM SUCC 125 MG IJ SOLR: 125.0000 mg | Freq: Once | INTRAMUSCULAR | Status: DC | PRN

## 2019-06-11 MED ORDER — SODIUM CHLORIDE 0.9 % IV SOLN
700.0000 mg | Freq: Once | INTRAVENOUS | Status: AC
  Administered 2019-06-11: 700 mg via INTRAVENOUS
  Filled 2019-06-11: qty 20

## 2019-06-11 MED ORDER — ALBUTEROL SULFATE HFA 108 (90 BASE) MCG/ACT IN AERS: 2.0000 | INHALATION_SPRAY | Freq: Once | RESPIRATORY_TRACT | Status: DC | PRN

## 2019-06-11 MED ORDER — EPINEPHRINE 0.3 MG/0.3ML IJ SOAJ: 0.3000 mg | Freq: Once | INTRAMUSCULAR | Status: DC | PRN

## 2019-06-11 NOTE — Discharge Instructions (Signed)

## 2019-06-11 NOTE — Progress Notes (Signed)
Patient ID: [name], female   DOB: 25-Dec-1931, 86 y.o.   MRN: [id number]  Diagnosis: [id number]  Physician:  Procedure: [facility] Med: bamlanivimab infusion - Provided patient with bamlanimivab fact sheet for patients, parents and caregivers prior to infusion.  Complications: No immediate complications noted.  Discharge: Discharged home   Heide Scales 06/11/2019

## 2020-01-10 ENCOUNTER — Ambulatory Visit (INDEPENDENT_AMBULATORY_CARE_PROVIDER_SITE_OTHER): Payer: Medicare Other | Admitting: Urology

## 2020-01-10 ENCOUNTER — Other Ambulatory Visit: Payer: Self-pay

## 2020-01-10 ENCOUNTER — Encounter: Payer: Self-pay | Admitting: Urology

## 2020-01-10 VITALS — BP 134/71 | HR 51 | Temp 98.1°F

## 2020-01-10 DIAGNOSIS — N3946 Mixed incontinence: Secondary | ICD-10-CM | POA: Diagnosis not present

## 2020-01-10 DIAGNOSIS — R350 Frequency of micturition: Secondary | ICD-10-CM

## 2020-01-10 LAB — URINALYSIS, COMPLETE
Bilirubin, UA: NEGATIVE
Glucose, UA: NEGATIVE
Ketones, UA: NEGATIVE
Leukocytes,UA: NEGATIVE
Nitrite, UA: NEGATIVE
Protein,UA: NEGATIVE
RBC, UA: NEGATIVE
Specific Gravity, UA: 1.025 (ref 1.005–1.030)
Urobilinogen, Ur: 0.2 mg/dL (ref 0.2–1.0)
pH, UA: 5.5 (ref 5.0–7.5)

## 2020-01-10 LAB — MICROSCOPIC EXAMINATION: Epithelial Cells (non renal): >10 /hpf — AB (ref 0–10)

## 2020-01-10 MED ORDER — MIRABEGRON ER 50 MG PO TB24: 50.0000 mg | ORAL_TABLET | Freq: Every day | ORAL | 11 refills | Status: DC

## 2020-01-10 NOTE — Patient Instructions (Signed)
Cystoscopy Cystoscopy is a procedure that is used to help diagnose and sometimes treat conditions that affect the lower urinary tract. The lower urinary tract includes the bladder and the urethra. The urethra is the tube that drains urine from the bladder. Cystoscopy is done using a thin, tube-shaped instrument with a light and camera at the end (cystoscope). The cystoscope may be hard or flexible, depending on the goal of the procedure. The cystoscope is inserted through the urethra, into the bladder. Cystoscopy may be recommended if you have:  Urinary tract infections that keep coming back.  Blood in the urine (hematuria).  An inability to control when you urinate (urinary incontinence) or an overactive bladder.  Unusual cells found in a urine sample.  A blockage in the urethra, such as a urinary stone.  Painful urination.  An abnormality in the bladder found during an intravenous pyelogram (IVP) or CT scan. Cystoscopy may also be done to remove a sample of tissue to be examined under a microscope (biopsy). What are the risks? Generally, this is a safe procedure. However, problems may occur, including:  Infection.  Bleeding.  What happens during the procedure?  1. You will be given one or more of the following: ? A medicine to numb the area (local anesthetic). 2. The area around the opening of your urethra will be cleaned. 3. The cystoscope will be passed through your urethra into your bladder. 4. Germ-free (sterile) fluid will flow through the cystoscope to fill your bladder. The fluid will stretch your bladder so that your health care provider can clearly examine your bladder walls. 5. Your doctor will look at the urethra and bladder. 6. The cystoscope will be removed The procedure may vary among health care providers  What can I expect after the procedure? After the procedure, it is common to have: 1. Some soreness or pain in your abdomen and urethra. 2. Urinary symptoms.  These include: ? Mild pain or burning when you urinate. Pain should stop within a few minutes after you urinate. This may last for up to 1 week. ? A small amount of blood in your urine for several days. ? Feeling like you need to urinate but producing only a small amount of urine. Follow these instructions at home: General instructions  Return to your normal activities as told by your health care provider.   Do not drive for 24 hours if you were given a sedative during your procedure.  Watch for any blood in your urine. If the amount of blood in your urine increases, call your health care provider.  If a tissue sample was removed for testing (biopsy) during your procedure, it is up to you to get your test results. Ask your health care provider, or the department that is doing the test, when your results will be ready.  Drink enough fluid to keep your urine pale yellow.  Keep all follow-up visits as told by your health care provider. This is important. Contact a health care provider if you:  Have pain that gets worse or does not get better with medicine, especially pain when you urinate.  Have trouble urinating.  Have more blood in your urine. Get help right away if you:  Have blood clots in your urine.  Have abdominal pain.  Have a fever or chills.  Are unable to urinate. Summary  Cystoscopy is a procedure that is used to help diagnose and sometimes treat conditions that affect the lower urinary tract.  Cystoscopy is done using   a thin, tube-shaped instrument with a light and camera at the end.  After the procedure, it is common to have some soreness or pain in your abdomen and urethra.  Watch for any blood in your urine. If the amount of blood in your urine increases, call your health care provider.  If you were prescribed an antibiotic medicine, take it as told by your health care provider. Do not stop taking the antibiotic even if you start to feel better. This  information is not intended to replace advice given to you by your health care provider. Make sure you discuss any questions you have with your health care provider. Document Revised: 05/12/2018 Document Reviewed: 05/12/2018 Elsevier Patient Education  2020 [employer]   

## 2020-01-10 NOTE — Progress Notes (Signed)
01/10/2020 2:38 PM   Martha Walker 09-18-1931 [id number]  Referring provider: Leonel Ramsay, MD [place],  [place] [id number]  Chief Complaint  Patient presents with  . Bladder Prolapse    HPI: I was consulted to assess the patient's incontinence and frequency.  She voids every 1 hour or more frequently.  She gets up 2-4 times at night.  She cannot hold it for 2 hours.  I believe she has urge incontinence but no stress incontinence.  She changes tissue.  She has no bedwetting.  Some of the details were more difficult to ascertain today  By review of medical records and history she does not appear to have been treated for her overactive bladder.  She denies bladder infections previous surgery and has not had a hysterectomy.   PMH: Past Medical History:  Diagnosis Date  . Ankle edema, bilateral   . Atrophic vaginitis   . B12 deficiency   . Bilateral carotid artery stenosis   . Chronic anemia   . CKD stage 3 due to type 2 diabetes mellitus ([place])   . Dementia ([place])   . Diabetes mellitus without complication ([place])   . Diverticulitis   . Hyperlipidemia   . Hypertension   . Osteoporosis   . PVD (peripheral vascular disease) ([place])   . Vitamin D deficiency     Surgical History: No past surgical history on file.  Home Medications:  Allergies as of 01/10/2020      Reactions   Amlodipine Cough   Augmentin [amoxicillin-pot Clavulanate]    Bactrim [sulfamethoxazole-trimethoprim]    Etodolac    Fosinopril    Ibudone [hydrocodone-ibuprofen]    Januvia [sitagliptin]    Lipitor [atorvastatin Calcium]    Lotrel [amlodipine Besy-benazepril Hcl]    Metronidazole    Mobic [meloxicam]    Penicillins    Pravastatin    Simvastatin    Vicodin [hydrocodone-acetaminophen]    Tylenol [acetaminophen] Rash      Medication List       Accurate as of January 10, 2020  2:38 PM. If you have any questions, ask your nurse or doctor.        Antacid 200-200-20  MG/5ML suspension Generic drug: alum & mag hydroxide-simeth Take 30 mLs by mouth every 6 (six) hours as needed for indigestion or heartburn.   brimonidine 0.2 % ophthalmic solution Commonly known as: ALPHAGAN Place 1 drop into both eyes 2 (two) times daily.   cetirizine 10 MG tablet Commonly known as: ZYRTEC Take 10 mg by mouth daily.   conjugated estrogens vaginal cream Commonly known as: PREMARIN Place 1 Applicatorful vaginally as directed. Twice weekly   Dermacloud Crea Apply topically.   diclofenac Sodium 1 % Gel Commonly known as: VOLTAREN Apply 2 g topically 4 (four) times daily as needed.   DIPHENHIST PO Take 25 mg by mouth every 6 (six) hours as needed.   docusate sodium 100 MG capsule Commonly known as: COLACE Take 100 mg by mouth daily.   ferrous sulfate 325 (65 FE) MG tablet Take 325 mg by mouth 3 (three) times daily with meals.   guaiFENesin 100 MG/5ML liquid Commonly known as: ROBITUSSIN Take 200 mg by mouth 3 (three) times daily as needed for cough.   hydrALAZINE 50 MG tablet Commonly known as: APRESOLINE Take 50 mg by mouth 2 (two) times daily.   latanoprost 0.005 % ophthalmic solution Commonly known as: XALATAN Place 1 drop into both eyes at bedtime.   loperamide 2 MG  capsule Commonly known as: IMODIUM Take 2 mg by mouth as needed for diarrhea or loose stools.   magnesium hydroxide 400 MG/5ML suspension Commonly known as: MILK OF MAGNESIA Take 30 mLs by mouth daily as needed for mild constipation.   Rhopressa 0.02 % Soln Generic drug: Netarsudil Dimesylate Place 1 drop into the right eye at bedtime.   rosuvastatin 5 MG tablet Commonly known as: CRESTOR Take 5 mg by mouth daily.   valsartan 80 MG tablet Commonly known as: DIOVAN Take 80 mg by mouth daily.   vitamin B-12 1000 MCG tablet Commonly known as: CYANOCOBALAMIN Take 1,000 mcg by mouth daily.   VITAMIN D PO Take 1,250 Units by mouth daily.       Allergies:  Allergies    Allergen Reactions  . Amlodipine Cough  . Augmentin [Amoxicillin-Pot Clavulanate]   . Bactrim [Sulfamethoxazole-Trimethoprim]   . Etodolac   . Fosinopril   . Ibudone [Hydrocodone-Ibuprofen]   . Januvia [Sitagliptin]   . Lipitor [Atorvastatin Calcium]   . Lotrel [Amlodipine Besy-Benazepril Hcl]   . Metronidazole   . Mobic [Meloxicam]   . Penicillins   . Pravastatin   . Simvastatin   . Vicodin [Hydrocodone-Acetaminophen]   . Tylenol [Acetaminophen] Rash    Family History: No family history on file.  Social History:  reports that she has never smoked. She has never used smokeless tobacco. She reports that she does not drink alcohol and does not use drugs.  ROS:                                        Physical Exam: There were no vitals taken for this visit.  Constitutional:  Alert and oriented, No acute distress. HEENT: [place] AT, moist mucus membranes.  Trachea midline, no masses. Cardiovascular: No clubbing, cyanosis, or edema. Respiratory: Normal respiratory effort, no increased work of breathing. GI: Abdomen is soft, nontender, nondistended, no abdominal masses GU: No CVA tenderness.  Sitting quietly in wheelchair Skin: No rashes, bruises or suspicious lesions. Lymph: No cervical or inguinal adenopathy. Neurologic: Grossly intact, no focal deficits, moving all 4 extremities. Psychiatric: Normal mood and affect.  Laboratory Data: Lab Results  Component Value Date   WBC 5.7 05/11/2019   HGB 10.4 (L) 05/11/2019   HCT 32.6 (L) 05/11/2019   MCV 90.6 05/11/2019   PLT 242 05/11/2019    Lab Results  Component Value Date   CREATININE 1.18 (H) 05/11/2019    No results found for: PSA  No results found for: TESTOSTERONE  No results found for: HGBA1C  Urinalysis No results found for: COLORURINE, APPEARANCEUR, LABSPEC, PHURINE, GLUCOSEU, HGBUR, BILIRUBINUR, KETONESUR, PROTEINUR, UROBILINOGEN, NITRITE, LEUKOCYTESUR  Pertinent Imaging: Reviewed  medical record.  No urine obtained.  Assessment & Plan: I would like to try the patient on Myrbetriq 50 mg samples and prescription.  I will see her back in about 6 weeks and perform a pelvic examination cystoscopy then.  I can get a urine culture then if needed.  She is in a nursing facility and we also ordered time voiding and for them to get a urine culture if they are able  There are no diagnoses linked to this encounter.  No follow-ups on file.  Reece Packer, MD  [facility] [address], [place], [place] [address] [phone]

## 2020-01-13 LAB — CULTURE, URINE COMPREHENSIVE

## 2020-01-18 ENCOUNTER — Other Ambulatory Visit: Payer: Self-pay

## 2020-01-18 ENCOUNTER — Encounter: Payer: Self-pay | Admitting: Dermatology

## 2020-01-18 ENCOUNTER — Telehealth: Payer: Self-pay

## 2020-01-18 ENCOUNTER — Ambulatory Visit (INDEPENDENT_AMBULATORY_CARE_PROVIDER_SITE_OTHER): Payer: Medicare Other | Admitting: Dermatology

## 2020-01-18 DIAGNOSIS — L308 Other specified dermatitis: Secondary | ICD-10-CM

## 2020-01-18 DIAGNOSIS — R238 Other skin changes: Secondary | ICD-10-CM

## 2020-01-18 DIAGNOSIS — D229 Melanocytic nevi, unspecified: Secondary | ICD-10-CM

## 2020-01-18 DIAGNOSIS — L820 Inflamed seborrheic keratosis: Secondary | ICD-10-CM | POA: Diagnosis not present

## 2020-01-18 DIAGNOSIS — L821 Other seborrheic keratosis: Secondary | ICD-10-CM

## 2020-01-18 DIAGNOSIS — L304 Erythema intertrigo: Secondary | ICD-10-CM | POA: Diagnosis not present

## 2020-01-18 MED ORDER — TRIAMCINOLONE ACETONIDE 0.1 % EX CREA: TOPICAL_CREAM | CUTANEOUS | 1 refills | Status: DC

## 2020-01-18 MED ORDER — HYDROCORTISONE 2.5 % EX OINT: TOPICAL_OINTMENT | CUTANEOUS | 0 refills | Status: DC

## 2020-01-18 MED ORDER — KETOCONAZOLE 2 % EX CREA: 1.0000 "application " | TOPICAL_CREAM | Freq: Two times a day (BID) | CUTANEOUS | 0 refills | Status: DC

## 2020-01-18 NOTE — Telephone Encounter (Signed)
Will make a note to complete FBSE at next visit and ensure she is not getting bed sores. Thank you Ardelle Park.

## 2020-01-18 NOTE — Patient Instructions (Signed)
Topical steroids (such as triamcinolone, fluocinolone, fluocinonide, mometasone, clobetasol, halobetasol, betamethasone, hydrocortisone) can cause thinning and lightening of the skin if they are used for too long in the same area. Your physician has selected the right strength medicine for your problem and area affected on the body. Please use your medication only as directed by your physician to prevent side effects.     Seborrheic Keratosis  What causes seborrheic keratoses? Seborrheic keratoses are harmless, common skin growths that first appear during adult life.  As time goes by, more growths appear.  Some people may develop a large number of them.  Seborrheic keratoses appear on both covered and uncovered body parts.  They are not caused by sunlight.  The tendency to develop seborrheic keratoses can be inherited.  They vary in color from skin-colored to gray, brown, or even black.  They can be either smooth or have a rough, warty surface.   Seborrheic keratoses are superficial and look as if they were stuck on the skin.  Under the microscope this type of keratosis looks like layers upon layers of skin.  That is why at times the top layer may seem to fall off, but the rest of the growth remains and re-grows.    Treatment Seborrheic keratoses do not need to be treated, but can easily be removed in the office.  Seborrheic keratoses often cause symptoms when they rub on clothing or jewelry.  Lesions can be in the way of shaving.  If they become inflamed, they can cause itching, soreness, or burning.  Removal of a seborrheic keratosis can be accomplished by freezing, burning, or surgery. If any spot bleeds, scabs, or grows rapidly, please return to have it checked, as these can be an indication of a skin cancer.  Cryotherapy Aftercare  . Wash gently with soap and water everyday.   Marland Kitchen Apply Vaseline and Band-Aid daily until healed. Marland Kitchen

## 2020-01-18 NOTE — Telephone Encounter (Signed)
Called pts daughter discussed findings and treatment plan that was discussed during pts office visit today   Pt daughter would like a TBSE at the next office visit in 1 month, she would like to make sure pt is not getting bed sores, discussed with pt daughter we will note this in the chart, but if she could also let the caretaker know we will perform a TBSE at next office visit just incase we do not see this note.

## 2020-01-18 NOTE — Progress Notes (Signed)
   New Patient Visit  Subjective  Martha Walker is a 86 y.o. female who presents for the following: Skin Problem  She reports itchy spots on her upper body that are growing and changing in color. They have been present for months to years.   She also reports itching at the back of her neck.  Caretaker is with patient today.  The following portions of the chart were reviewed this encounter and updated as appropriate:  Tobacco  Allergies  Meds  Problems  Med Hx  Surg Hx  Fam Hx      Review of Systems:  No other skin or systemic complaints except as noted in HPI or Assessment and Plan.  Objective  Well appearing patient in no apparent distress; mood and affect are within normal limits.  All skin waist up examined.  Objective  L upper back, L abdomen, R abdomen (9): Erythematous keratotic or waxy stuck-on papule or plaque.   Objective  lower abdomen: Macerated pink patch   Objective  L lower lip: Venous lake   Objective  Neck - Anterior: Scaly erythematous papules and patches +/- dyspigmentation, lichenification, excoriations.    Assessment & Plan  Inflamed seborrheic keratosis (9) L upper back, L abdomen, R abdomen  Destruction of lesion - L upper back, L abdomen, R abdomen Complexity: simple   Destruction method: cryotherapy   Informed consent: discussed and consent obtained   Timeout:  patient name, date of birth, surgical site, and procedure verified Lesion destroyed using liquid nitrogen: Yes   Region frozen until ice ball extended beyond lesion: Yes   Outcome: patient tolerated procedure well with no complications   Post-procedure details: wound care instructions given    Erythema intertrigo lower abdomen  Start Ketoconazole cream apply to skin first bid up to 2 weeks, then use as needed  Start Hydrocortisone 2.5% ointment apply to skin second bid up to 2 weeks then stop   Ordered Medications: hydrocortisone 2.5 % ointment ketoconazole (NIZORAL) 2 %  cream  Venous lake of lip L lower lip  Observe   Other eczema Neck - Anterior   Start Triamcinolone cream apply to neck bid prn for eczema flares    Avoid applying to face, groin, and axilla. Use as directed. Risk of skin atrophy with long-term use reviewed.   Topical steroids (such as triamcinolone, fluocinolone, fluocinonide, mometasone, clobetasol, halobetasol, betamethasone, hydrocortisone) can cause thinning and lightening of the skin if they are used for too long in the same area. Your physician has selected the right strength medicine for your problem and area affected on the body. Please use your medication only as directed by your physician to prevent side effects.    Ordered Medications: triamcinolone cream (KENALOG) 0.1 %  Seborrheic Keratoses - Stuck-on, waxy, tan-brown papules and plaques  - Discussed benign etiology and prognosis. - Observe - Call for any changes  Melanocytic Nevi - Tan-brown and/or pink-flesh-colored symmetric macules and papules - Benign appearing on exam today - Observation - Call clinic for new or changing moles - Recommend daily use of broad spectrum spf 30+ sunscreen to sun-exposed areas.   Return in about 1 month (around 02/18/2020) for FBSE, Check for bed sores, f/u intertrigo, eczema and ISK .  I, Marye Round, CMA, am acting as scribe for Forest Gleason, MD .  Documentation: I have reviewed the above documentation for accuracy and completeness, and I agree with the above.  Forest Gleason, MD

## 2020-02-17 ENCOUNTER — Other Ambulatory Visit: Payer: Self-pay

## 2020-02-17 ENCOUNTER — Telehealth: Payer: Self-pay

## 2020-02-17 ENCOUNTER — Ambulatory Visit (INDEPENDENT_AMBULATORY_CARE_PROVIDER_SITE_OTHER): Payer: Medicare Other | Admitting: Dermatology

## 2020-02-17 DIAGNOSIS — L308 Other specified dermatitis: Secondary | ICD-10-CM

## 2020-02-17 DIAGNOSIS — L578 Other skin changes due to chronic exposure to nonionizing radiation: Secondary | ICD-10-CM

## 2020-02-17 DIAGNOSIS — L304 Erythema intertrigo: Secondary | ICD-10-CM | POA: Diagnosis not present

## 2020-02-17 DIAGNOSIS — L814 Other melanin hyperpigmentation: Secondary | ICD-10-CM

## 2020-02-17 DIAGNOSIS — L821 Other seborrheic keratosis: Secondary | ICD-10-CM

## 2020-02-17 DIAGNOSIS — L219 Seborrheic dermatitis, unspecified: Secondary | ICD-10-CM | POA: Diagnosis not present

## 2020-02-17 DIAGNOSIS — D229 Melanocytic nevi, unspecified: Secondary | ICD-10-CM

## 2020-02-17 DIAGNOSIS — Z1283 Encounter for screening for malignant neoplasm of skin: Secondary | ICD-10-CM | POA: Diagnosis not present

## 2020-02-17 DIAGNOSIS — D1800 Hemangioma unspecified site: Secondary | ICD-10-CM

## 2020-02-17 MED ORDER — KETOCONAZOLE 2 % EX SHAM: MEDICATED_SHAMPOO | CUTANEOUS | 2 refills | Status: DC

## 2020-02-17 MED ORDER — KETOCONAZOLE 2 % EX CREA: 1.0000 "application " | TOPICAL_CREAM | Freq: Every day | CUTANEOUS | 2 refills | Status: AC

## 2020-02-17 MED ORDER — FLUOCINOLONE ACETONIDE BODY 0.01 % EX OIL: TOPICAL_OIL | CUTANEOUS | 2 refills | Status: DC

## 2020-02-17 MED ORDER — TRIAMCINOLONE ACETONIDE 0.1 % EX OINT: TOPICAL_OINTMENT | CUTANEOUS | 2 refills | Status: AC

## 2020-02-17 MED ORDER — HYDROCORTISONE 2.5 % EX OINT: TOPICAL_OINTMENT | CUTANEOUS | 2 refills | Status: DC

## 2020-02-17 NOTE — Patient Instructions (Addendum)
For scalp -  Start ketoconazole 2% shampoo once weekly, massaging into scalp and leaving on for 10 minutes before washing out.  Start fluocinolone solution daily as needed for itch at the scalp.   For face - Recommend ketoconazole 2% cream to areas at face that are dry and scaly once daily as needed.  For low abdomen and inguinal folds - Start Nystatin powder daily for prevention.  Start ketoconazole 2% cream daily as needed for rash. Start HC 2.5% cream daily as needed for rash up to 2 weeks.    For back - Restart TMC 0.1% ointment to affected areas twice daily as needed for itchy rash at back. Avoid applying to face, groin, and axilla. Use as directed. Risk of skin atrophy with long-term use reviewed.   Topical steroids (such as triamcinolone, fluocinolone, fluocinonide, mometasone, clobetasol, halobetasol, betamethasone, hydrocortisone) can cause thinning and lightening of the skin if they are used for too long in the same area. Your physician has selected the right strength medicine for your problem and area affected on the body. Please use your medication only as directed by your physician to prevent side effects.

## 2020-02-17 NOTE — Telephone Encounter (Signed)
DermaSmoote Oil not covered. Pharmacy called, who has called the insurance and Mometasone Solution is preferred for patients plan.

## 2020-02-17 NOTE — Progress Notes (Signed)
Follow-Up Visit   Subjective  Martha Walker is a 86 y.o. female who presents for the following: FBSE (Patient here today for FBSE. No history of skin cancer. ).  Patient advises there are some spots on her back that itch. She is accompanied by staff from [facility].  Patient was seen about 6 weeks ago and was given ketoconazole and HC 2.5% ointment for intertrigo at the lower abdomen which is a little improved. Her scalp is itchy and bothersome.   The following portions of the chart were reviewed this encounter and updated as appropriate:  Tobacco  Allergies  Meds  Problems  Med Hx  Surg Hx  Fam Hx      Review of Systems:  No other skin or systemic complaints except as noted in HPI or Assessment and Plan.  Objective  Well appearing patient in no apparent distress; mood and affect are within normal limits.  A full examination was performed including scalp, head, eyes, ears, nose, lips, neck, chest, axillae, abdomen, back, buttocks, bilateral upper extremities, bilateral lower extremities, hands, feet, fingers, toes, fingernails, and toenails. All findings within normal limits unless otherwise noted below.  Objective  Scalp: Pink patches with greasy scale.   Objective  Low abdomen, inguinal folds: Inguinal folds and low abdomen Macerated pink patches, improved from prior  Objective  Mid Back: Scaly erythematous papules and patches +/- dyspigmentation, lichenification, excoriations.    Assessment & Plan  Seborrheic dermatitis Scalp  Chronic, flared  Start ketoconazole 2% shampoo once weekly, massaging into scalp and leaving on for 10 minutes before washing out.  Start fluocinolone solution daily as needed for itch at the scalp. Avoid applying to face, groin, and axilla. Use as directed. Risk of skin atrophy with long-term use reviewed.  Recommend ketoconazole 2% cream once daily to areas at the face that are dry and scaly.   Topical steroids (such as  triamcinolone, fluocinolone, fluocinonide, mometasone, clobetasol, halobetasol, betamethasone, hydrocortisone) can cause thinning and lightening of the skin if they are used for too long in the same area. Your physician has selected the right strength medicine for your problem and area affected on the body. Please use your medication only as directed by your physician to prevent side effects.    mometasone (ELOCON) 0.1 % lotion - Scalp  Ordered Medications: ketoconazole (NIZORAL) 2 % shampoo  Erythema intertrigo Low abdomen, inguinal folds  Start Nystatin powder daily for prevention.  Start ketoconazole 2% cream daily as needed for rash. Start HC 2.5% cream daily as needed for rash up to 2 weeks.   Topical steroids (such as triamcinolone, fluocinolone, fluocinonide, mometasone, clobetasol, halobetasol, betamethasone, hydrocortisone) can cause thinning and lightening of the skin if they are used for too long in the same area. Your physician has selected the right strength medicine for your problem and area affected on the body. Please use your medication only as directed by your physician to prevent side effects.    Reordered Medications ketoconazole (NIZORAL) 2 % cream hydrocortisone 2.5 % ointment  Other eczema Mid Back  Restart TMC 0.1% ointment to affected areas twice daily as needed for itchy rash at back. Avoid applying to face, groin, and axilla. Use as directed. Risk of skin atrophy with long-term use reviewed.   Topical steroids (such as triamcinolone, fluocinolone, fluocinonide, mometasone, clobetasol, halobetasol, betamethasone, hydrocortisone) can cause thinning and lightening of the skin if they are used for too long in the same area. Your physician has selected the right  strength medicine for your problem and area affected on the body. Please use your medication only as directed by your physician to prevent side effects.     triamcinolone cream (KENALOG) 0.1 % - Mid  Back  Ordered Medications: triamcinolone ointment (KENALOG) 0.1 %  Lentigines - Scattered tan macules - Discussed due to sun exposure - Benign, observe - Call for any changes  Seborrheic Keratoses - Stuck-on, waxy, tan-brown papules and plaques  - Discussed benign etiology and prognosis. - Observe - Call for any changes  Melanocytic Nevi - Tan-brown and/or pink-flesh-colored symmetric macules and papules - Benign appearing on exam today - Observation - Call clinic for new or changing moles - Recommend daily use of broad spectrum spf 30+ sunscreen to sun-exposed areas.   Hemangiomas - Red papules - Discussed benign nature - Observe - Call for any changes  Actinic Damage - diffuse scaly erythematous macules with underlying dyspigmentation - Recommend daily broad spectrum sunscreen SPF 30+ to sun-exposed areas, reapply every 2 hours as needed.  - Call for new or changing lesions.  Skin cancer screening performed today.  Return in about 3 months (around 05/18/2020) for follow up.  Graciella Belton, RMA, am acting as scribe for Forest Gleason, MD .  Documentation: I have reviewed the above documentation for accuracy and completeness, and I agree with the above.  Forest Gleason, MD

## 2020-02-18 MED ORDER — MOMETASONE FUROATE 0.1 % EX SOLN: CUTANEOUS | 4 refills | Status: DC

## 2020-02-18 NOTE — Telephone Encounter (Signed)
It was actually supposed to be sent as solution rather than oil, but I changed to mometasone. Thank you

## 2020-02-21 ENCOUNTER — Other Ambulatory Visit: Payer: Self-pay | Admitting: Urology

## 2020-02-28 ENCOUNTER — Encounter: Payer: Self-pay | Admitting: Dermatology

## 2020-03-06 ENCOUNTER — Other Ambulatory Visit: Payer: Medicare Other | Admitting: Urology

## 2020-03-20 ENCOUNTER — Other Ambulatory Visit: Payer: Self-pay

## 2020-03-20 ENCOUNTER — Ambulatory Visit: Payer: Medicare Other | Admitting: Urology

## 2020-03-20 VITALS — BP 124/70 | HR 87

## 2020-03-20 DIAGNOSIS — R350 Frequency of micturition: Secondary | ICD-10-CM

## 2020-03-20 MED ORDER — MIRABEGRON ER 50 MG PO TB24
50.0000 mg | ORAL_TABLET | Freq: Every day | ORAL | 11 refills | Status: DC
Start: 2020-03-20 — End: 2020-05-29

## 2020-03-20 NOTE — Progress Notes (Signed)
03/20/2020 10:52 AM   Martha Walker January 09, 1932 [id number]  Referring provider: Leonel Ramsay, MD [place],  [place] [address]  Chief Complaint  Patient presents with  . Cysto    HPI: I was consulted to assess the patient's incontinence and frequency.  She voids every 1 hour or more frequently.  She gets up 2-4 times at night.  She cannot hold it for 2 hours.  I believe she has urge incontinence but no stress incontinence.  She changes tissue.  She has no bedwetting.  Some of the details were more difficult to ascertain today  By review of medical records and history she does not appear to have been treated for her overactive bladder.   I would like to try the patient on Myrbetriq 50 mg samples and prescription.  I will see her back in about 6 weeks and perform a pelvic examination cystoscopy then.  I can get a urine culture then if needed.  She is in a nursing facility and we also ordered time voiding and for them to get a urine culture if they are able  Today Frequency stable.  Urine culture January 10, 2020 negative Clinically not infected today.  Patient did not want cystoscopy.  Again she was quite vague but she said the medicine she thought was helping.  She really could not quantitate it.  She said she had more control and less urge incontinence.  She could not tell me if she actually fill the prescription.  History again was difficult     PMH: Past Medical History:  Diagnosis Date  . Ankle edema, bilateral   . Atrophic vaginitis   . B12 deficiency   . Bilateral carotid artery stenosis   . Chronic anemia   . CKD stage 3 due to type 2 diabetes mellitus ([facility])   . Dementia ([facility])   . Diabetes mellitus without complication ([facility])   . Diverticulitis   . Hyperlipidemia   . Hypertension   . Osteoporosis   . PVD (peripheral vascular disease) ([facility])   . Vitamin D deficiency     Surgical History: No past surgical history on file.  Home Medications:   Allergies as of 03/20/2020      Reactions   Amlodipine Cough   Augmentin [amoxicillin-pot Clavulanate]    Bactrim [sulfamethoxazole-trimethoprim]    Etodolac    Fosinopril    Ibudone [hydrocodone-ibuprofen]    Januvia [sitagliptin]    Lipitor [atorvastatin Calcium]    Lotrel [amlodipine Besy-benazepril Hcl]    Metronidazole    Mobic [meloxicam]    Penicillins    Pravastatin    Simvastatin    Vicodin [hydrocodone-acetaminophen]    Tylenol [acetaminophen] Rash      Medication List       Accurate as of March 20, 2020 10:52 AM. If you have any questions, ask your nurse or doctor.        Antacid 200-200-20 MG/5ML suspension Generic drug: alum & mag hydroxide-simeth Take 30 mLs by mouth every 6 (six) hours as needed for indigestion or heartburn.   brimonidine 0.2 % ophthalmic solution Commonly known as: ALPHAGAN Place 1 drop into both eyes 2 (two) times daily.   cetirizine 10 MG tablet Commonly known as: ZYRTEC Take 10 mg by mouth daily.   conjugated estrogens vaginal cream Commonly known as: PREMARIN Place 1 Applicatorful vaginally as directed. Twice weekly   diclofenac Sodium 1 % Gel Commonly known as: VOLTAREN Apply 2 g topically 4 (four) times daily  as needed.   docusate sodium 100 MG capsule Commonly known as: COLACE Take 100 mg by mouth daily.   hydrALAZINE 50 MG tablet Commonly known as: APRESOLINE Take 50 mg by mouth 2 (two) times daily.   hydrocortisone 2.5 % ointment Apply to lower abdomen and groin for rash once daily up to 2 weeks   ketoconazole 2 % shampoo Commonly known as: NIZORAL Massage into scalp and let sit 10 minutes before washing out once weekly.   ketoconazole 2 % cream Commonly known as: NIZORAL Apply 1 application topically daily. Apply to affected areas at abdomen and groin once daily as needed for rash   latanoprost 0.005 % ophthalmic solution Commonly known as: XALATAN Place 1 drop into both eyes at bedtime.   loperamide  2 MG capsule Commonly known as: IMODIUM Take 2 mg by mouth as needed for diarrhea or loose stools.   magnesium hydroxide 400 MG/5ML suspension Commonly known as: MILK OF MAGNESIA Take 30 mLs by mouth daily as needed for mild constipation.   metFORMIN 500 MG tablet Commonly known as: GLUCOPHAGE Take 500 mg by mouth daily with breakfast.   mirabegron ER 50 MG Tb24 tablet Commonly known as: MYRBETRIQ Take 1 tablet (50 mg total) by mouth daily.   mometasone 0.1 % lotion Commonly known as: ELOCON Apply daily as needed to scalp for itch or scale. Avoid applying to face, groin, and axilla.   Rhopressa 0.02 % Soln Generic drug: Netarsudil Dimesylate Place 1 drop into the right eye at bedtime.   rosuvastatin 5 MG tablet Commonly known as: CRESTOR Take 5 mg by mouth daily.   triamcinolone cream 0.1 % Commonly known as: KENALOG Apply to neck twice a day prn for eczema flare, avoid face,groin,axilla   triamcinolone ointment 0.1 % Commonly known as: KENALOG Apply to affected areas twice daily as needed for itchy rash at back. Avoid applying to face, groin, and axilla. Use as directed. Risk of skin atrophy with long-term use reviewed.   valsartan 80 MG tablet Commonly known as: DIOVAN Take 80 mg by mouth daily.   vitamin B-12 1000 MCG tablet Commonly known as: CYANOCOBALAMIN Take 1,000 mcg by mouth daily.   VITAMIN D PO Take 1,250 Units by mouth daily.   Vyzulta 0.024 % Soln Generic drug: Latanoprostene Bunod Apply to eye.       Allergies:  Allergies  Allergen Reactions  . Amlodipine Cough  . Augmentin [Amoxicillin-Pot Clavulanate]   . Bactrim [Sulfamethoxazole-Trimethoprim]   . Etodolac   . Fosinopril   . Ibudone [Hydrocodone-Ibuprofen]   . Januvia [Sitagliptin]   . Lipitor [Atorvastatin Calcium]   . Lotrel [Amlodipine Besy-Benazepril Hcl]   . Metronidazole   . Mobic [Meloxicam]   . Penicillins   . Pravastatin   . Simvastatin   . Vicodin  [Hydrocodone-Acetaminophen]   . Tylenol [Acetaminophen] Rash    Family History: No family history on file.  Social History:  reports that she has never smoked. She has never used smokeless tobacco. She reports that she does not drink alcohol and does not use drugs.  ROS:                                        Physical Exam: There were no vitals taken for this visit.  Constitutional:  Alert and oriented, No acute distress.  Laboratory Data: Lab Results  Component Value Date   WBC 5.7 05/11/2019  HGB 10.4 (L) 05/11/2019   HCT 32.6 (L) 05/11/2019   MCV 90.6 05/11/2019   PLT 242 05/11/2019    Lab Results  Component Value Date   CREATININE 1.18 (H) 05/11/2019    No results found for: PSA  No results found for: TESTOSTERONE  No results found for: HGBA1C  Urinalysis    Component Value Date/Time   APPEARANCEUR Cloudy (A) 01/10/2020 1510   GLUCOSEU Negative 01/10/2020 1510   BILIRUBINUR Negative 01/10/2020 1510   PROTEINUR Negative 01/10/2020 1510   NITRITE Negative 01/10/2020 1510   LEUKOCYTESUR Negative 01/10/2020 1510    Pertinent Imaging:   Assessment & Plan: More samples and prescription given.  Reassess 8 weeks.  Percutaneous tibial nerve stimulation and and other overactive bladder medications are options.  I think we will need to have reasonable treatment goal  1. Urinary frequency  - Urinalysis, Complete   No follow-ups on file.  Reece Packer, MD  [facility] [address] [place], [place] [address] [phone]

## 2020-05-11 ENCOUNTER — Other Ambulatory Visit: Payer: Self-pay

## 2020-05-11 ENCOUNTER — Ambulatory Visit (INDEPENDENT_AMBULATORY_CARE_PROVIDER_SITE_OTHER): Payer: Medicare Other | Admitting: Dermatology

## 2020-05-11 DIAGNOSIS — L219 Seborrheic dermatitis, unspecified: Secondary | ICD-10-CM | POA: Diagnosis not present

## 2020-05-11 DIAGNOSIS — L304 Erythema intertrigo: Secondary | ICD-10-CM | POA: Diagnosis not present

## 2020-05-11 DIAGNOSIS — L308 Other specified dermatitis: Secondary | ICD-10-CM | POA: Diagnosis not present

## 2020-05-11 MED ORDER — KETOCONAZOLE 2 % EX CREA: 1.0000 "application " | TOPICAL_CREAM | Freq: Two times a day (BID) | CUTANEOUS | 2 refills | Status: AC

## 2020-05-11 MED ORDER — TRIAMCINOLONE ACETONIDE 0.1 % EX CREA: 1.0000 "application " | TOPICAL_CREAM | Freq: Two times a day (BID) | CUTANEOUS | 2 refills | Status: DC | PRN

## 2020-05-11 MED ORDER — KETOCONAZOLE 2 % EX SHAM: MEDICATED_SHAMPOO | CUTANEOUS | 2 refills | Status: AC

## 2020-05-11 MED ORDER — HYDROCORTISONE 2.5 % EX CREA: TOPICAL_CREAM | Freq: Two times a day (BID) | CUTANEOUS | 2 refills | Status: DC | PRN

## 2020-05-11 NOTE — Progress Notes (Signed)
   Follow-Up Visit   Subjective  Martha Walker is a 86 y.o. female who presents for the following: Follow-up (3 mo f/u for seb derm on scalp, intertrigo on lower right abdomen, and eczema on back. ).  The following portions of the chart were reviewed this encounter and updated as appropriate:  Tobacco  Allergies  Meds  Problems  Med Hx  Surg Hx  Fam Hx      Review of Systems: No other skin or systemic complaints except as noted in HPI or Assessment and Plan.   Objective  Well appearing patient in no apparent distress; mood and affect are within normal limits.  A focused examination was performed including scalp, abdomen, and back. Relevant physical exam findings are noted in the Assessment and Plan.  Objective  Scalp: Pink patches with greasy scale.   Objective  Right Abdomen (side) - Lower: Erythematous macerated patches  Assessment & Plan  Seborrheic dermatitis Scalp  Chronic condition with expected duration over one year. Condition is bothersome to patient. Not currently at goal.  Increase ketoconazole shampoo to twice per week. Leave in for 10 minutes before rinsing.   Start mometasone 0.1% lotion . Apply daily as needed to scalp for itch. Avoid applying to face, groin, and axilla. Use as directed. Risk of skin atrophy with long-term use reviewed.   Topical steroids (such as triamcinolone, fluocinolone, fluocinonide, mometasone, clobetasol, halobetasol, betamethasone, hydrocortisone) can cause thinning and lightening of the skin if they are used for too long in the same area. Your physician has selected the right strength medicine for your problem and area affected on the body. Please use your medication only as directed by your physician to prevent side effects.    Reordered Medications ketoconazole (NIZORAL) 2 % shampoo  Other Related Medications mometasone (ELOCON) 0.1 % lotion  Intertrigo Right Abdomen (side) - Lower  Currently flared.   Start  ketoconazole cream. Apply to affected areas BID for 2 week, then as needed.   Start Hydrocortisone 2.5 % cream. Apply to affected areas BID for 1 week.   Ordered Medications: hydrocortisone 2.5 % cream ketoconazole (NIZORAL) 2 % cream  Other eczema Mid Back  Not at goal. Ointment is bothersome due to greasiness.  D/c triamcinolone 0.1% ointment.  Start triamcinolone 0.1% cream. Apply twice a day as needed. Avoid applying to face, groin, and axilla. Use as directed. Risk of skin atrophy with long-term use reviewed.   Topical steroids (such as triamcinolone, fluocinolone, fluocinonide, mometasone, clobetasol, halobetasol, betamethasone, hydrocortisone) can cause thinning and lightening of the skin if they are used for too long in the same area. Your physician has selected the right strength medicine for your problem and area affected on the body. Please use your medication only as directed by your physician to prevent side effects.    Ordered Medications: triamcinolone (KENALOG) 0.1 %  Other Related Medications triamcinolone cream (KENALOG) 0.1 % triamcinolone ointment (KENALOG) 0.1 %  Return in about 3 months (around 08/09/2020) for 3 mo f/u intertrigo, eczema, seb derm.   I, Harriett Sine, CMA, am acting as scribe for Forest Gleason, MD.  Documentation: I have reviewed the above documentation for accuracy and completeness, and I agree with the above.  Forest Gleason, MD

## 2020-05-15 ENCOUNTER — Ambulatory Visit: Payer: Self-pay | Admitting: Urology

## 2020-05-29 ENCOUNTER — Encounter: Payer: Self-pay | Admitting: Urology

## 2020-05-29 ENCOUNTER — Ambulatory Visit (INDEPENDENT_AMBULATORY_CARE_PROVIDER_SITE_OTHER): Payer: Medicare Other | Admitting: Urology

## 2020-05-29 ENCOUNTER — Other Ambulatory Visit: Payer: Self-pay

## 2020-05-29 VITALS — BP 134/78 | HR 87

## 2020-05-29 DIAGNOSIS — N3946 Mixed incontinence: Secondary | ICD-10-CM | POA: Diagnosis not present

## 2020-05-29 MED ORDER — MIRABEGRON ER 50 MG PO TB24
50.0000 mg | ORAL_TABLET | Freq: Every day | ORAL | 11 refills | Status: DC
Start: 2020-05-29 — End: 2020-05-29

## 2020-05-29 MED ORDER — MIRABEGRON ER 50 MG PO TB24
50.0000 mg | ORAL_TABLET | Freq: Every day | ORAL | 11 refills | Status: DC
Start: 2020-05-29 — End: 2021-07-09

## 2020-05-29 NOTE — Progress Notes (Signed)
05/29/2020 9:37 AM   Martha Walker 03/20/1932 [id number]  Referring provider: Leonel Ramsay, MD [name],  [name] [address]  Chief Complaint  Patient presents with  . Follow-up    HPI: I was consulted to assess the patient's incontinence and frequency. She voids every 1 hour or more frequently. She gets up 2-4 times at night. She cannot hold it for 2 hours. I believe she has urge incontinence but no stress incontinence. She changes tissue. She has no bedwetting. Some of the details were more difficult to ascertain today  I would like to try the patient on Myrbetriq 50 mg samples and prescription. I will see her back in about 6 weeks and perform a pelvic examination cystoscopy then. I can get a urine culture then if needed. She is in a nursing facility and we also ordered time voiding and for them to get a urine culture if they are able  Patient did not want cystoscopy.  Again she was quite vague but she said the medicine she thought was helping.  She really could not quantitate it.  She said she had more control and less urge incontinence.  She could not tell me if she actually fill the prescription.  History again was difficult  More samples and prescription given.  Reassess 8 weeks.  Percutaneous tibial nerve stimulation and and other overactive bladder medications are options.  I think we will need to have reasonable treatment goal  Today Frequency stable Patient reports urgency incontinence and urgency much better.  Clinically not infected.   PMH: Past Medical History:  Diagnosis Date  . Ankle edema, bilateral   . Atrophic vaginitis   . B12 deficiency   . Bilateral carotid artery stenosis   . Chronic anemia   . CKD stage 3 due to type 2 diabetes mellitus ([facility])   . Dementia ([facility])   . Diabetes mellitus without complication ([facility])   . Diverticulitis   . Hyperlipidemia   . Hypertension   . Osteoporosis   . PVD (peripheral vascular  disease) ([facility])   . Vitamin D deficiency     Surgical History: No past surgical history on file.  Home Medications:  Allergies as of 05/29/2020      Reactions   Amlodipine Cough   Amlodipine Besy-benazepril Hcl    Other reaction(s): Unknown   Atorvastatin Nausea And Vomiting   Onset 02/08/2011.   Augmentin [amoxicillin-pot Clavulanate]    Bactrim [sulfamethoxazole-trimethoprim]    Etodolac    Fosinopril    Ibudone [hydrocodone-ibuprofen]    Januvia [sitagliptin]    Lipitor [atorvastatin Calcium]    Metronidazole    Mobic [meloxicam]    Penicillins    Pravastatin    Simvastatin    Vicodin [hydrocodone-acetaminophen]    Tylenol [acetaminophen] Rash      Medication List       Accurate as of May 29, 2020  9:37 AM. If you have any questions, ask your nurse or doctor.        acetaminophen 325 MG tablet Commonly known as: TYLENOL Take by mouth.   alum & mag hydroxide-simeth 200-200-20 MG/5ML suspension Commonly known as: MAALOX/MYLANTA Take 30 mLs by mouth every 6 (six) hours as needed for indigestion or heartburn.   brimonidine 0.2 % ophthalmic solution Commonly known as: ALPHAGAN Place 1 drop into both eyes 2 (two) times daily.   cetirizine 10 MG tablet Commonly known as: ZYRTEC Take 10 mg by mouth daily.   Cholecalciferol 1.25 MG (50000  UT) capsule Take by mouth.   conjugated estrogens vaginal cream Commonly known as: PREMARIN Place 1 Applicatorful vaginally as directed. Twice weekly   diclofenac Sodium 1 % Gel Commonly known as: VOLTAREN Apply 2 g topically 4 (four) times daily as needed.   docusate sodium 100 MG capsule Commonly known as: COLACE Take 100 mg by mouth daily.   dorzolamide-timolol 22.3-6.8 MG/ML ophthalmic solution Commonly known as: COSOPT 1 drop 2 (two) times daily.   Fifty50 Glucose Meter 2.0 w/Device Kit Use as directed. DX:E11.9   hydrALAZINE 50 MG tablet Commonly known as: APRESOLINE Take 50 mg by mouth 2 (two) times  daily.   hydrALAZINE 25 MG tablet Commonly known as: APRESOLINE Take 25 mg by mouth 2 (two) times daily.   hydrocortisone 2.5 % ointment Apply to lower abdomen and groin for rash once daily up to 2 weeks   hydrocortisone 2.5 % cream Apply topically 2 (two) times daily as needed (Rash).   ketoconazole 2 % shampoo Commonly known as: NIZORAL Massage into scalp and let sit 10 minutes before washing out twice weekly.   ketoconazole 2 % cream Commonly known as: NIZORAL Apply 1 application topically in the morning and at bedtime. Apply to affected areas BID for two weeks, then as needed.   latanoprost 0.005 % ophthalmic solution Commonly known as: XALATAN Place 1 drop into both eyes at bedtime.   loperamide 2 MG capsule Commonly known as: IMODIUM Take 2 mg by mouth as needed for diarrhea or loose stools.   magnesium hydroxide 400 MG/5ML suspension Commonly known as: MILK OF MAGNESIA Take 30 mLs by mouth daily as needed for mild constipation.   metFORMIN 500 MG tablet Commonly known as: GLUCOPHAGE Take 500 mg by mouth daily with breakfast.   mirabegron ER 50 MG Tb24 tablet Commonly known as: MYRBETRIQ Take 1 tablet (50 mg total) by mouth daily.   mometasone 0.1 % lotion Commonly known as: ELOCON Apply daily as needed to scalp for itch or scale. Avoid applying to face, groin, and axilla.   Rhopressa 0.02 % Soln Generic drug: Netarsudil Dimesylate Place 1 drop into the right eye at bedtime.   rosuvastatin 5 MG tablet Commonly known as: CRESTOR Take 5 mg by mouth daily.   triamcinolone 0.1 % Commonly known as: KENALOG Apply to neck twice a day prn for eczema flare, avoid face,groin,axilla   triamcinolone ointment 0.1 % Commonly known as: KENALOG Apply to affected areas twice daily as needed for itchy rash at back. Avoid applying to face, groin, and axilla. Use as directed. Risk of skin atrophy with long-term use reviewed.   triamcinolone 0.1 % Commonly known as:  KENALOG Apply 1 application topically 2 (two) times daily as needed.   valsartan 80 MG tablet Commonly known as: DIOVAN Take 80 mg by mouth daily.   vitamin B-12 1000 MCG tablet Commonly known as: CYANOCOBALAMIN Take 1,000 mcg by mouth daily.   VITAMIN D PO Take 1,250 Units by mouth daily.   Vyzulta 0.024 % Soln Generic drug: Latanoprostene Bunod Apply to eye.       Allergies:  Allergies  Allergen Reactions  . Amlodipine Cough  . Amlodipine Besy-Benazepril Hcl     Other reaction(s): Unknown  . Atorvastatin Nausea And Vomiting    Onset 02/08/2011.  . Augmentin [Amoxicillin-Pot Clavulanate]   . Bactrim [Sulfamethoxazole-Trimethoprim]   . Etodolac   . Fosinopril   . Ibudone [Hydrocodone-Ibuprofen]   . Januvia [Sitagliptin]   . Lipitor [Atorvastatin Calcium]   . Metronidazole   .  Mobic [Meloxicam]   . Penicillins   . Pravastatin   . Simvastatin   . Vicodin [Hydrocodone-Acetaminophen]   . Tylenol [Acetaminophen] Rash    Family History: No family history on file.  Social History:  reports that she has never smoked. She has never used smokeless tobacco. She reports that she does not drink alcohol and does not use drugs.  ROS:                                        Physical Exam: There were no vitals taken for this visit.  Constitutional:  Alert and oriented, No acute distress.   Laboratory Data: Lab Results  Component Value Date   WBC 5.7 05/11/2019   HGB 10.4 (L) 05/11/2019   HCT 32.6 (L) 05/11/2019   MCV 90.6 05/11/2019   PLT 242 05/11/2019    Lab Results  Component Value Date   CREATININE 1.18 (H) 05/11/2019    No results found for: PSA  No results found for: TESTOSTERONE  No results found for: HGBA1C  Urinalysis    Component Value Date/Time   APPEARANCEUR Cloudy (A) 01/10/2020 1510   GLUCOSEU Negative 01/10/2020 1510   BILIRUBINUR Negative 01/10/2020 1510   PROTEINUR Negative 01/10/2020 1510   NITRITE Negative  01/10/2020 1510   LEUKOCYTESUR Negative 01/10/2020 1510    Pertinent Imaging:   Assessment & Plan: Prescription renewed and see in 1 year  There are no diagnoses linked to this encounter.  No follow-ups on file.  Reece Packer, MD  [facility] [address], [place], [place] [address] [phone]

## 2020-05-31 ENCOUNTER — Encounter: Payer: Self-pay | Admitting: Dermatology

## 2020-08-16 ENCOUNTER — Ambulatory Visit: Payer: Medicare Other | Admitting: Dermatology

## 2020-11-30 ENCOUNTER — Ambulatory Visit (INDEPENDENT_AMBULATORY_CARE_PROVIDER_SITE_OTHER): Payer: Medicare Other | Admitting: Dermatology

## 2020-11-30 ENCOUNTER — Other Ambulatory Visit: Payer: Self-pay

## 2020-11-30 DIAGNOSIS — L219 Seborrheic dermatitis, unspecified: Secondary | ICD-10-CM | POA: Diagnosis not present

## 2020-11-30 DIAGNOSIS — D1800 Hemangioma unspecified site: Secondary | ICD-10-CM

## 2020-11-30 DIAGNOSIS — L821 Other seborrheic keratosis: Secondary | ICD-10-CM

## 2020-11-30 DIAGNOSIS — L309 Dermatitis, unspecified: Secondary | ICD-10-CM

## 2020-11-30 DIAGNOSIS — L304 Erythema intertrigo: Secondary | ICD-10-CM

## 2020-11-30 DIAGNOSIS — B353 Tinea pedis: Secondary | ICD-10-CM | POA: Diagnosis not present

## 2020-11-30 MED ORDER — KETOCONAZOLE 2 % EX CREA: 1.0000 "application " | TOPICAL_CREAM | Freq: Every day | CUTANEOUS | 11 refills | Status: AC

## 2020-11-30 MED ORDER — CICLOPIROX OLAMINE 0.77 % EX SUSP: 1.0000 "application " | Freq: Every day | CUTANEOUS | 11 refills | Status: DC

## 2020-11-30 NOTE — Patient Instructions (Addendum)
For rash at face, ears under breast, lower abdomen, upper thighs and feet (including between the toes)  Start ketoconazole 2 % cream daily  For feet   Apply ammonium lactate (non-prescription over the counter cream) to scaly areas of feet daily. If she develops irritation, do it every other day instead.  For scalp  Start Ciclopirox 0.77 % suspension - apply topically to scalp daily  Continue mometasone 0.1 % lotion apply as needed to scalp when she has ITCH or SCALE. Avoid to face, groin, and axilla   Continue ketoconazole 2 % shampoo - massage into scalp and let sit 10 minutes before washing out twice weekly       If you have any questions or concerns for your doctor, please call our main line at [phone] and press option 4 to reach your doctor's medical assistant. If no one answers, please leave a voicemail as directed and we will return your call as soon as possible. Messages left after 4 pm will be answered the following business day.   You may also send [place] a message via [facility]. We typically respond to MyChart messages within 1-2 business days.  For prescription refills, please ask your pharmacy to contact our office. Our fax number is [phone].  If you have an urgent issue when the clinic is closed that cannot wait until the next business day, you can page your doctor at the number below.    Please note that while we do our best to be available for urgent issues outside of office hours, we are not available 24/7.   If you have an urgent issue and are unable to reach [place], you may choose to seek medical care at your doctor's office, retail clinic, urgent care center, or emergency room.  If you have a medical emergency, please immediately call 911 or go to the emergency department.  Pager Numbers  - Dr. Nehemiah Massed: [phone]  - Dr. Laurence Ferrari: [phone]  - Dr. Nicole Kindred: [phone]  In the event of inclement weather, please call our main line at [phone] for an  update on the status of any delays or closures.  Dermatology Medication Tips: Please keep the boxes that topical medications come in in order to help keep track of the instructions about where and how to use these. Pharmacies typically print the medication instructions only on the boxes and not directly on the medication tubes.   If your medication is too expensive, please contact our office at [phone] option 4 or send [place] a message through [facility].   We are unable to tell what your co-pay for medications will be in advance as this is different depending on your insurance coverage. However, we may be able to find a substitute medication at lower cost or fill out paperwork to get insurance to cover a needed medication.   If a prior authorization is required to get your medication covered by your insurance company, please allow [place] 1-2 business days to complete this process.  Drug prices often vary depending on where the prescription is filled and some pharmacies may offer cheaper prices.  The website www.goodrx.com contains coupons for medications through different pharmacies. The prices here do not account for what the cost may be with help from insurance (it may be cheaper with your insurance), but the website can give you the price if you did not use any insurance.  - You can print the associated coupon and take it with your prescription to the pharmacy.  - You may also  stop by our office during regular business hours and pick up a GoodRx coupon card.  - If you need your prescription sent electronically to a different pharmacy, notify our office through [facility] or by phone at [phone] option 4.

## 2020-11-30 NOTE — Progress Notes (Signed)
Follow-Up Visit   Subjective  Martha Walker is a 87 y.o. female who presents for the following: 3 month follow up (Patient is here today with daughter for 3 month follow up of seborrheic dermatitis at scalp and face, eczema at back and intertrigo at right abdomen. Patient states she is still having itch at scalp. She was prescribed ketoconazole shampoo and mometasone cream to use at scalp and face. She was prescribed ketoconazole cream and hydrocortisone to use at right abdomen).  The following portions of the chart were reviewed this encounter and updated as appropriate:  Tobacco  Allergies  Meds  Problems  Med Hx  Surg Hx  Fam Hx      Objective  Well appearing patient in no apparent distress; mood and affect are within normal limits.  A focused examination was performed including scalp, face, ears, abdomen, back, bilateral feet, upper thighs. Relevant physical exam findings are noted in the Assessment and Plan.  Scalp Pink patches with greasy scale.   under breast, lower abdomen, upper thighs Erythematous patches  Right Foot - Anterior Scaling and maceration web spaces and over distal and lateral soles.   Assessment & Plan  Seborrheic dermatitis Scalp  Chronic condition with duration or expected duration over one year. Condition is bothersome to patient. Currently flared.  Seborrheic Dermatitis  -  is a chronic persistent rash characterized by pinkness and scaling most commonly of the mid face but also can occur on the scalp (dandruff), ears; mid chest and mid back. It tends to be exacerbated by stress and cooler weather.  People who have neurologic disease may experience new onset or exacerbation of existing seborrheic dermatitis.  The condition is not curable but treatable and can be controlled.  Start Ciclopirox 0.77 % susp - apply topically to scalp daily  Continue mometasone 0.1 % lotion apply as needed to scalp for itch and scale. Avoid to face, groin, and axilla    Continue ketoconazole 2 % shampoo - massage into scalp and let sit 10 minutes before washing out twice weekly   Start ketoconazole cream apply to rash at face and ears   ciclopirox (LOPROX) 0.77 % SUSP - Scalp Apply 1 application topically daily. To scalp  ketoconazole (NIZORAL) 2 % cream - Scalp Apply 1 application topically daily. Apply to rash at ears, face, under breast, lower abdomen and upper thighs  Related Medications mometasone (ELOCON) 0.1 % lotion Apply daily as needed to scalp for itch or scale. Avoid applying to face, groin, and axilla.  ketoconazole (NIZORAL) 2 % shampoo Massage into scalp and let sit 10 minutes before washing out twice weekly.  Erythema intertrigo under breast, lower abdomen, upper thighs  Start ketoconazole 2 % cream apply to areas under breast, lower abdomen and top of legs twice a day until clear  Call if not clearing up and would add hydrocortisone 2.5% cream twice a day as needed up to 2 weeks  Tinea pedis of both feet Right Foot - Anterior  Start ketoconazole cream apply to entire area of both feet daily and in between toes   Apply ammonium lactate to scaly areas of feet daily as needed   Seborrheic Keratoses - Stuck-on, waxy, tan-brown papules and/or plaques at back  - Benign-appearing - Discussed benign etiology and prognosis. - Observe - Call for any changes  Hemangiomas - Red papules at back - Discussed benign nature - Observe - Call for any changes  Return in about 3 months (around 03/02/2021) for follow  up . I, Ruthell Rummage, CMA, am acting as scribe for Forest Gleason, MD.   Documentation: I have reviewed the above documentation for accuracy and completeness, and I agree with the above.  Forest Gleason, MD

## 2020-12-17 ENCOUNTER — Encounter: Payer: Self-pay | Admitting: Dermatology

## 2021-02-19 NOTE — Discharge Instructions (Signed)

## 2021-02-21 ENCOUNTER — Ambulatory Visit: Payer: Medicare Other | Admitting: Anesthesiology

## 2021-02-21 ENCOUNTER — Ambulatory Visit
Admission: RE | Admit: 2021-02-21 | Discharge: 2021-02-21 | Disposition: A | Discharge status: Home / Self Care | Payer: Medicare Other | Attending: Ophthalmology | Admitting: Ophthalmology

## 2021-02-21 ENCOUNTER — Encounter: Payer: Self-pay | Admitting: Ophthalmology

## 2021-02-21 ENCOUNTER — Encounter
Admission: RE | Disposition: A | Discharge status: Home / Self Care | Payer: Self-pay | Source: Home / Self Care | Attending: Ophthalmology

## 2021-02-21 DIAGNOSIS — Z886 Allergy status to analgesic agent status: Secondary | ICD-10-CM | POA: Diagnosis not present

## 2021-02-21 DIAGNOSIS — Z7984 Long term (current) use of oral hypoglycemic drugs: Secondary | ICD-10-CM | POA: Insufficient documentation

## 2021-02-21 DIAGNOSIS — I129 Hypertensive chronic kidney disease with stage 1 through stage 4 chronic kidney disease, or unspecified chronic kidney disease: Secondary | ICD-10-CM | POA: Diagnosis not present

## 2021-02-21 DIAGNOSIS — Z881 Allergy status to other antibiotic agents status: Secondary | ICD-10-CM | POA: Insufficient documentation

## 2021-02-21 DIAGNOSIS — N183 Chronic kidney disease, stage 3 unspecified: Secondary | ICD-10-CM | POA: Diagnosis not present

## 2021-02-21 DIAGNOSIS — E1122 Type 2 diabetes mellitus with diabetic chronic kidney disease: Secondary | ICD-10-CM | POA: Diagnosis not present

## 2021-02-21 DIAGNOSIS — H401113 Primary open-angle glaucoma, right eye, severe stage: Secondary | ICD-10-CM | POA: Diagnosis not present

## 2021-02-21 DIAGNOSIS — Z880 Allergy status to penicillin: Secondary | ICD-10-CM | POA: Insufficient documentation

## 2021-02-21 DIAGNOSIS — Z888 Allergy status to other drugs, medicaments and biological substances status: Secondary | ICD-10-CM | POA: Insufficient documentation

## 2021-02-21 DIAGNOSIS — H2511 Age-related nuclear cataract, right eye: Secondary | ICD-10-CM | POA: Diagnosis not present

## 2021-02-21 DIAGNOSIS — E1151 Type 2 diabetes mellitus with diabetic peripheral angiopathy without gangrene: Secondary | ICD-10-CM | POA: Diagnosis not present

## 2021-02-21 DIAGNOSIS — Z79899 Other long term (current) drug therapy: Secondary | ICD-10-CM | POA: Diagnosis not present

## 2021-02-21 DIAGNOSIS — Z885 Allergy status to narcotic agent status: Secondary | ICD-10-CM | POA: Diagnosis not present

## 2021-02-21 DIAGNOSIS — E1136 Type 2 diabetes mellitus with diabetic cataract: Secondary | ICD-10-CM | POA: Insufficient documentation

## 2021-02-21 HISTORY — PX: CATARACT EXTRACTION W/PHACO: SHX586

## 2021-02-21 LAB — GLUCOSE, CAPILLARY
Glucose-Capillary: 182 mg/dL — ABNORMAL HIGH (ref 70–99)
Glucose-Capillary: 183 mg/dL — ABNORMAL HIGH (ref 70–99)

## 2021-02-21 SURGERY — PHACOEMULSIFICATION, CATARACT, WITH IOL INSERTION
Anesthesia: Monitor Anesthesia Care | Site: Eye | Laterality: Right

## 2021-02-21 MED ORDER — SIGHTPATH DOSE#1 BSS IO SOLN
INTRAOCULAR | Status: DC | PRN
  Administered 2021-02-21: 1 mL via INTRAMUSCULAR

## 2021-02-21 MED ORDER — TETRACAINE HCL 0.5 % OP SOLN
1.0000 [drp] | OPHTHALMIC | Status: DC | PRN
  Administered 2021-02-21 (×3): 1 [drp] via OPHTHALMIC

## 2021-02-21 MED ORDER — LACTATED RINGERS IV SOLN: INTRAVENOUS | Status: DC

## 2021-02-21 MED ORDER — PHENYLEPHRINE HCL 10 % OP SOLN
1.0000 [drp] | OPHTHALMIC | Status: DC | PRN
  Administered 2021-02-21 (×3): 1 [drp] via OPHTHALMIC

## 2021-02-21 MED ORDER — SIGHTPATH DOSE#1 NA HYALUR & NA CHOND-NA HYALUR IO KIT
PACK | INTRAOCULAR | Status: DC | PRN
  Administered 2021-02-21: 1 via OPHTHALMIC

## 2021-02-21 MED ORDER — SIGHTPATH DOSE#1 BSS IO SOLN
INTRAOCULAR | Status: DC | PRN
  Administered 2021-02-21: 71 mL via OPHTHALMIC

## 2021-02-21 MED ORDER — MIDAZOLAM HCL 2 MG/2ML IJ SOLN
INTRAMUSCULAR | Status: DC | PRN
Start: 2021-02-21 — End: 2021-02-21
  Administered 2021-02-21: 1 mg via INTRAVENOUS

## 2021-02-21 MED ORDER — FENTANYL CITRATE (PF) 100 MCG/2ML IJ SOLN
INTRAMUSCULAR | Status: DC | PRN
  Administered 2021-02-21: 50 ug via INTRAVENOUS

## 2021-02-21 MED ORDER — MOXIFLOXACIN HCL 0.5 % OP SOLN
OPHTHALMIC | Status: DC | PRN
  Administered 2021-02-21: 0.2 mL via OPHTHALMIC

## 2021-02-21 MED ORDER — SIGHTPATH DOSE#1 BSS IO SOLN
INTRAOCULAR | Status: DC | PRN
  Administered 2021-02-21: 15 mL

## 2021-02-21 MED ORDER — CYCLOPENTOLATE HCL 2 % OP SOLN
1.0000 [drp] | OPHTHALMIC | Status: DC | PRN
  Administered 2021-02-21 (×3): 1 [drp] via OPHTHALMIC

## 2021-02-21 SURGICAL SUPPLY — 13 items
BLADE DUAL KAHOOK SINGLE USE (BLADE) ×2 IMPLANT
GLOVE SURG ENC TEXT LTX SZ7.5 (GLOVE) ×2 IMPLANT
GLOVE SURG GAMMEX PI TX LF 7.5 (GLOVE) IMPLANT
ICLIP (OPHTHALMIC RELATED) ×2 IMPLANT
LENS IOL DIOP 20.5 (Intraocular Lens) ×2 IMPLANT
LENS IOL TECNIS MONO 20.5 (Intraocular Lens) ×1 IMPLANT
MARKER SKIN DUAL TIP RULER LAB (MISCELLANEOUS) ×2 IMPLANT
NEEDLE FILTER BLUNT 18X 1/2SAF (NEEDLE) ×2
NEEDLE FILTER BLUNT 18X1 1/2 (NEEDLE) ×2 IMPLANT
SYR 3ML LL SCALE MARK (SYRINGE) ×4 IMPLANT
SYR TB 1ML LUER SLIP (SYRINGE) ×2 IMPLANT
WATER STERILE IRR 250ML POUR (IV SOLUTION) ×2 IMPLANT
WIPE NON LINTING 3.25X3.25 (MISCELLANEOUS) ×2 IMPLANT

## 2021-02-21 NOTE — Transfer of Care (Signed)
Immediate Anesthesia Transfer of Care Note  Patient: Martha Walker  Procedure(s) Performed: CATARACT EXTRACTION PHACO AND INTRAOCULAR LENS PLACEMENT (IOC) RIGHT [place] DUAL BLADE GONIOTOMY (Right: Eye)  Patient Location: PACU  Anesthesia Type: MAC  Level of Consciousness: awake, alert  and patient cooperative  Airway and Oxygen Therapy: Patient Spontanous Breathing and Patient connected to supplemental oxygen  Post-op Assessment: Post-op Vital signs reviewed, Patient's Cardiovascular Status Stable, Respiratory Function Stable, Patent Airway and No signs of Nausea or vomiting  Post-op Vital Signs: Reviewed and stable  Complications: No notable events documented.

## 2021-02-21 NOTE — Anesthesia Procedure Notes (Signed)
Procedure Name: MAC Date/Time: 02/21/2021 1:31 PM Performed by: Cameron Ali, CRNA Pre-anesthesia Checklist: Patient identified, Emergency Drugs available, Suction available, Timeout performed and Patient being monitored Patient Re-evaluated:Patient Re-evaluated prior to induction Oxygen Delivery Method: Nasal cannula Placement Confirmation: positive ETCO2

## 2021-02-21 NOTE — Anesthesia Postprocedure Evaluation (Signed)
Anesthesia Post Note  Patient: Martha Walker  Procedure(s) Performed: CATARACT EXTRACTION PHACO AND INTRAOCULAR LENS PLACEMENT (IOC) RIGHT [place] DUAL BLADE GONIOTOMY (Right: Eye)     Patient location during evaluation: PACU Anesthesia Type: MAC Level of consciousness: awake and alert Pain management: pain level controlled Vital Signs Assessment: post-procedure vital signs reviewed and stable Respiratory status: spontaneous breathing, nonlabored ventilation and respiratory function stable Cardiovascular status: stable and blood pressure returned to baseline Postop Assessment: no apparent nausea or vomiting Anesthetic complications: no   No notable events documented.  April Manson

## 2021-02-21 NOTE — Op Note (Signed)
  PREOPERATIVE DIAGNOSIS:  Nuclear sclerotic cataract  right eye. 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     severe stage Primary Open Angle Glaucoma right eye H40.1113  PROCEDURE:  Phacoemusification with posterior chamber intraocular lens placement of the right eye  [place] Dual Blade goniotomy right eye  Ultrasound time: Procedure(s) with comments: CATARACT EXTRACTION PHACO AND INTRAOCULAR LENS PLACEMENT (IOC) RIGHT [place] DUAL BLADE GONIOTOMY (Right) - 12.45 01:20.1 LENS:  Implant Name Type Inv. Item Serial No. Manufacturer Lot No. LRB No. Used Action  LENS IOL DIOP 20.5 - [id number] Intraocular Lens LENS IOL DIOP 20.5 [id number]   Right 1 Implanted    SURGEON:  Wyonia Hough, MD   ANESTHESIA:  Topical with tetracaine drops augmented with 1% preservative-free intracameral lidocaine.    COMPLICATIONS:  None.   DESCRIPTION OF PROCEDURE:  The patient was identified in the holding room and transported to the operating room and placed in the supine position under the operating microscope.  The right eye was identified as the operative eye and it was prepped and draped in the usual sterile ophthalmic fashion.   A 1 millimeter clear-corneal paracentesis was made at the 12:00 position.  0.5 ml of preservative-free 1% lidocaine was injected into the anterior chamber.  The anterior chamber was filled with Viscoat viscoelastic.  A 2.4 millimeter keratome was used to make a near-clear corneal incision at the 9:00 position. The microscope was adjusted and a gonioprism was used to visulaize the trabecular meshwork.  The [facility] Dual Blade was advanced across the anterior chamber under viscoelastic.  The blade was used to mark the trabecular meshwork at the 1:30 position.  The blade was placed two clock hours clockwise into the meshwork.  Proper postioning was confirmed.  The blade ws passed  counterclockwise through the meshwork to excise approximately two to three clock-hours of trabecular meshwork.   A curvilinear capsulorrhexis was made with a cystotome and capsulorrhexis forceps.  Balanced salt solution was used to hydrodissect and hydrodelineate the nucleus.   Phacoemulsification was then used in stop and chop fashion to remove the lens nucleus and epinucleus.  The remaining cortex was then removed using the irrigation and aspiration handpiece. Provisc was then placed into the capsular bag to distend it for lens placement.  A lens was then injected into the capsular bag.  The remaining viscoelastic was aspirated.   Wounds were hydrated with balanced salt solution.  The anterior chamber was inflated to a physiologic pressure with balanced salt solution.  No wound leaks were noted. Cefuroxime 0.1 ml of a 10mg /ml solution was injected into the anterior chamber for a dose of 1 mg of intracameral antibiotic at the completion of the case.  The patient was taken to the recovery room in stable condition without complications of anesthesia or surgery.

## 2021-02-21 NOTE — H&P (Signed)
[facility]   Primary Care Physician:  Leonel Ramsay, MD Ophthalmologist: Dr. Leandrew Koyanagi  Pre-Procedure History & Physical: HPI:  Martha Walker is a 87 y.o. female here for ophthalmic surgery.   Past Medical History:  Diagnosis Date   Ankle edema, bilateral    Atrophic vaginitis    B12 deficiency    Bilateral carotid artery stenosis    Chronic anemia    CKD stage 3 due to type 2 diabetes mellitus (HCC)    Dementia (HCC)    Diabetes mellitus without complication ([place])    Diverticulitis    Hyperlipidemia    Hypertension    Osteoporosis    PVD (peripheral vascular disease) (HCC)    Vitamin D deficiency     History reviewed. No pertinent surgical history.  Prior to Admission medications   Medication Sig Start Date End Date Taking? Authorizing Provider  acetaminophen (TYLENOL) 325 MG tablet Take by mouth.   Yes [provider]  alum & mag hydroxide-simeth (MAALOX/MYLANTA) 200-200-20 MG/5ML suspension Take 30 mLs by mouth every 6 (six) hours as needed for indigestion or heartburn.   Yes [provider]  brimonidine (ALPHAGAN) 0.2 % ophthalmic solution Place 1 drop into both eyes 2 (two) times daily.   Yes [provider]  cetirizine (ZYRTEC) 10 MG tablet Take 10 mg by mouth daily.   Yes [provider]  Cholecalciferol 1.25 MG (50000 UT) capsule Take by mouth.   Yes [provider]  ciclopirox (LOPROX) 0.77 % SUSP Apply 1 application topically daily. To scalp 11/30/20  Yes Moye, [place], MD  conjugated estrogens (PREMARIN) vaginal cream Place 1 Applicatorful vaginally as directed. Twice weekly   Yes [provider]  diclofenac Sodium (VOLTAREN) 1 % GEL Apply 2 g topically 4 (four) times daily as needed.   Yes [provider]  docusate sodium (COLACE) 100 MG capsule Take 100 mg by mouth daily.   Yes [provider]  dorzolamide-timolol (COSOPT) 22.3-6.8 MG/ML ophthalmic solution 1 drop 2  (two) times daily. 02/04/20  Yes [provider]  GAVILAX 17 GM/SCOOP powder Take by mouth. 11/16/20  Yes [provider]  hydrALAZINE (APRESOLINE) 50 MG tablet Take 50 mg by mouth 2 (two) times daily.   Yes [provider]  hydrocortisone 2.5 % cream Apply topically 2 (two) times daily as needed (Rash). 05/11/20  Yes Moye, [place], MD  ketoconazole (NIZORAL) 2 % shampoo Massage into scalp and let sit 10 minutes before washing out twice weekly. 05/11/20  Yes Moye, [place], MD  latanoprost (XALATAN) 0.005 % ophthalmic solution Place 1 drop into both eyes at bedtime.   Yes [provider]  Latanoprostene Bunod (VYZULTA) 0.024 % SOLN Apply to eye.   Yes [provider]  loperamide (IMODIUM) 2 MG capsule Take 2 mg by mouth as needed for diarrhea or loose stools.   Yes [provider]  magnesium hydroxide (MILK OF MAGNESIA) 400 MG/5ML suspension Take 30 mLs by mouth daily as needed for mild constipation.   Yes [provider]  metFORMIN (GLUCOPHAGE) 500 MG tablet Take 500 mg by mouth daily with breakfast.   Yes [provider]  mirabegron ER (MYRBETRIQ) 50 MG TB24 tablet Take 1 tablet (50 mg total) by mouth daily. 05/29/20  Yes MacDiarmid, Nicki Reaper, MD  mometasone (ELOCON) 0.1 % lotion Apply daily as needed to scalp for itch or scale. Avoid applying to face, groin, and axilla. 02/18/20  Yes Moye, [place], MD  rosuvastatin (CRESTOR) 5 MG  tablet Take 5 mg by mouth daily.   Yes [provider]  SENNA-TABS 8.6 MG tablet Take by mouth daily as needed. 11/21/20  Yes [provider]  triamcinolone ointment (KENALOG) 0.1 % Apply to affected areas twice daily as needed for itchy rash at back. Avoid applying to face, groin, and axilla. Use as directed. Risk of skin atrophy with long-term use reviewed. 02/17/20  Yes Moye, [place], MD  valsartan (DIOVAN) 80 MG tablet Take 80 mg by mouth daily.   Yes [provider]  vitamin  B-12 (CYANOCOBALAMIN) 1000 MCG tablet Take 1,000 mcg by mouth daily.   Yes [provider]  VITAMIN D PO Take 1,250 Units by mouth daily.   Yes [provider]  Blood Glucose Monitoring Suppl (FIFTY50 GLUCOSE METER 2.0) w/Device KIT Use as directed. DX:E11.9 03/14/14   [provider]  Netarsudil Dimesylate (RHOPRESSA) 0.02 % SOLN Place 1 drop into the right eye at bedtime.    [provider]    Allergies as of 11/20/2020 - Review Complete 05/31/2020  Allergen Reaction Noted   Amlodipine Cough 03/02/2014   Amlodipine besy-benazepril hcl  05/09/2019   Atorvastatin Nausea And Vomiting 03/02/2014   Augmentin [amoxicillin-pot clavulanate]  05/09/2019   Bactrim [sulfamethoxazole-trimethoprim]  05/09/2019   Etodolac  05/09/2019   Fosinopril  05/09/2019   Ibudone [hydrocodone-ibuprofen]  05/09/2019   Januvia [sitagliptin]  05/09/2019   Lipitor [atorvastatin calcium]  05/09/2019   Metronidazole  05/09/2019   Mobic [meloxicam]  05/09/2019   Penicillins  05/09/2019   Pravastatin  05/09/2019   Simvastatin  05/09/2019   Vicodin [hydrocodone-acetaminophen]  05/09/2019   Tylenol [acetaminophen] Rash 05/09/2019    History reviewed. No pertinent family history.  Social History   Socioeconomic History   Marital status: Widowed    Spouse name: Not on file   Number of children: Not on file   Years of education: Not on file   Highest education level: Not on file  Occupational History   Not on file  Tobacco Use   Smoking status: Never   Smokeless tobacco: Never  Vaping Use   Vaping Use: Never used  Substance and Sexual Activity   Alcohol use: Never   Drug use: Never   Sexual activity: Not on file  Other Topics Concern   Not on file  Social History Narrative   Lives at assisted living facility- [facility].   Social Determinants of Health   Financial Resource Strain: Not on file  Food Insecurity: Not on file  Transportation Needs: Not on file   Physical Activity: Not on file  Stress: Not on file  Social Connections: Not on file  Intimate Partner Violence: Not on file    Review of Systems: See HPI, otherwise negative ROS  Physical Exam: BP 129/86   Pulse 78   Temp 97.9 F (36.6 C) (Temporal)   Resp 16   Ht _0  (1.727 m)   Wt 74.4 kg   SpO2 100%   BMI 24.94 kg/m  General:   Alert,  pleasant and cooperative in NAD Head:  Normocephalic and atraumatic. Lungs:  Clear to auscultation.    Heart:  Regular rate and rhythm.   Impression/Plan: Martha Walker is here for ophthalmic surgery.  Risks, benefits, limitations, and alternatives regarding ophthalmic surgery have been reviewed with the patient.  Questions have been answered.  All parties agreeable.   Leandrew Koyanagi, MD  02/21/2021, 12:49 PM

## 2021-02-21 NOTE — Anesthesia Preprocedure Evaluation (Signed)
Anesthesia Evaluation  Patient identified by MRN, date of birth, ID band Patient awake    Reviewed: Allergy & Precautions, H&P , NPO status , Patient's Chart, lab work & pertinent test results, reviewed documented beta blocker date and time   Airway Mallampati: II  TM Distance: >3 FB Neck ROM: full    Dental no notable dental hx.    Pulmonary neg pulmonary ROS,    Pulmonary exam normal breath sounds clear to auscultation       Cardiovascular Exercise Tolerance: Good hypertension, negative cardio ROS Normal cardiovascular exam Rhythm:regular Rate:Normal     Neuro/Psych negative neurological ROS  negative psych ROS   GI/Hepatic negative GI ROS, Neg liver ROS,   Endo/Other  negative endocrine ROSdiabetes  Renal/GU CRFRenal disease (CKD III)negative Renal ROS  negative genitourinary   Musculoskeletal   Abdominal   Peds  Hematology negative hematology ROS (+) Blood dyscrasia (B12), anemia ,   Anesthesia Other Findings   Reproductive/Obstetrics negative OB ROS                             Anesthesia Physical Anesthesia Plan  ASA: 2  Anesthesia Plan: MAC   Post-op Pain Management:    Induction:   PONV Risk Score and Plan: 2 and Midazolam, TIVA and Treatment may vary due to age or medical condition  Airway Management Planned:   Additional Equipment:   Intra-op Plan:   Post-operative Plan:   Informed Consent: I have reviewed the patients History and Physical, chart, labs and discussed the procedure including the risks, benefits and alternatives for the proposed anesthesia with the patient or authorized representative who has indicated his/her understanding and acceptance.     Dental Advisory Given  Plan Discussed with: CRNA  Anesthesia Plan Comments:         Anesthesia Quick Evaluation

## 2021-02-22 ENCOUNTER — Encounter: Payer: Self-pay | Admitting: Ophthalmology

## 2021-02-28 ENCOUNTER — Ambulatory Visit (INDEPENDENT_AMBULATORY_CARE_PROVIDER_SITE_OTHER): Payer: Medicare Other | Admitting: Dermatology

## 2021-02-28 ENCOUNTER — Encounter: Payer: Self-pay | Admitting: Dermatology

## 2021-02-28 ENCOUNTER — Other Ambulatory Visit: Payer: Self-pay

## 2021-02-28 DIAGNOSIS — L219 Seborrheic dermatitis, unspecified: Secondary | ICD-10-CM | POA: Diagnosis not present

## 2021-02-28 DIAGNOSIS — L308 Other specified dermatitis: Secondary | ICD-10-CM

## 2021-02-28 DIAGNOSIS — L304 Erythema intertrigo: Secondary | ICD-10-CM | POA: Diagnosis not present

## 2021-02-28 DIAGNOSIS — L570 Actinic keratosis: Secondary | ICD-10-CM | POA: Diagnosis not present

## 2021-02-28 DIAGNOSIS — L309 Dermatitis, unspecified: Secondary | ICD-10-CM

## 2021-02-28 NOTE — Progress Notes (Signed)
Follow-Up Visit   Subjective  Martha Walker is a 87 y.o. female who presents for the following: Follow-up (Patient here today for follow up for seb derm and intertrigo. Patient was prescribed ketoconazole shampoo, mometasone and ciclopirox suspension for the scalp and ketoconazole for the face. Patient prescribed ketoconazole cream and HC 2.5% cream for intertrigo. Patient lives in home and is unsure of medications being used, advises she still has some rash and itching at scalp, face and under breasts. Patient also using ketoconazole cream at feet to treat tinea. ).  Patient accompanied   The following portions of the chart were reviewed this encounter and updated as appropriate:   Tobacco  Allergies  Meds  Problems  Med Hx  Surg Hx  Fam Hx      Review of Systems:  No other skin or systemic complaints except as noted in HPI or Assessment and Plan.  Objective  Well appearing patient in no apparent distress; mood and affect are within normal limits.  A focused examination was performed including face, scalp, chest, feet. Relevant physical exam findings are noted in the Assessment and Plan.  Scalp Scale and some erythema at the scalp  inframammary Clear   Right Foot - Anterior No evidence of tinea pedis    left nasal sidewall, left cheek (2) Erythematous thin papules/macules with gritty scale.    Assessment & Plan  Seborrheic dermatitis Scalp Chronic condition with duration or expected duration over one year. Condition is bothersome to patient. Currently flared. Patient complaining of itch  Continue ketoconazole 2% shampoo apply three times per week, massage into scalp and leave in for 10 minutes before rinsing out  Start mometasone daily for 2 weeks and then once weekly until follow up.   Discontinue ciclopirox suspension due to lack of improvement  Related Medications mometasone (ELOCON) 0.1 % lotion Apply daily as needed to scalp for itch or scale. Avoid  applying to face, groin, and axilla.  ketoconazole (NIZORAL) 2 % shampoo Massage into scalp and let sit 10 minutes before washing out twice weekly.  Erythema intertrigo inframammary  Clear today.  Intertrigo is a chronic recurrent rash that occurs in skin fold areas that may be associated with friction; heat; moisture; yeast; fungus; and bacteria.  It is exacerbated by increased movement / activity; sweating; and higher atmospheric temperature.  Start Zeasorb AF powder daily for prevention  Dermatitis neglecta Right Foot - Anterior  Recommend ammonium lactate to help beak down scale Recommend scrub with soap and wash cloth to help remove dead skin/scale  Facial dermatitis Head - Anterior (Face)  Start HC 2.5% twice daily to face for 1 week then discontinue.   Topical steroids (such as triamcinolone, fluocinolone, fluocinonide, mometasone, clobetasol, halobetasol, betamethasone, hydrocortisone) can cause thinning and lightening of the skin if they are used for too long in the same area. Your physician has selected the right strength medicine for your problem and area affected on the body. Please use your medication only as directed by your physician to prevent side effects.    AK (actinic keratosis) (2) left nasal sidewall, left cheek  Actinic keratoses are precancerous spots that appear secondary to cumulative UV radiation exposure/sun exposure over time. They are chronic with expected duration over 1 year. A portion of actinic keratoses will progress to squamous cell carcinoma of the skin. It is not possible to reliably predict which spots will progress to skin cancer and so treatment is recommended to prevent development of skin cancer.  Recommend  daily broad spectrum sunscreen SPF 30+ to sun-exposed areas, reapply every 2 hours as needed.  Recommend staying in the shade or wearing long sleeves, sun glasses (UVA+UVB protection) and wide brim hats (4-inch brim around the entire  circumference of the hat). Call for new or changing lesions.  Prior to procedure, discussed risks of blister formation, small wound, skin dyspigmentation, or rare scar following cryotherapy. Recommend Vaseline ointment to treated areas while healing.  Spoke with patient's daughter, Martha Walker and she gave verbal consent to treat AK's with LN2.   Destruction of lesion - left nasal sidewall, left cheek  Destruction method: cryotherapy   Informed consent: discussed and consent obtained   Lesion destroyed using liquid nitrogen: Yes   Cryotherapy cycles:  2 Outcome: patient tolerated procedure well with no complications   Post-procedure details: wound care instructions given    Return for 2-3 month follow up.   Documentation: I have reviewed the above documentation for accuracy and completeness, and I agree with the above.  Forest Gleason, MD

## 2021-02-28 NOTE — Patient Instructions (Addendum)
Cryotherapy Aftercare  Wash gently with soap and water everyday.   Apply Vaseline and Band-Aid daily until healed.   For seborrheic dermatitis at scalp -  Continue ketoconazole 2% shampoo apply three times per week, massage into scalp and leave in for 10 minutes before rinsing out Start mometasone daily for 2 weeks and then once weekly until follow up.  Discontinue ciclopirox suspension.  For intertrigo at inframmary -  Recommend Zeasorb powder daily for prevention  For feet -  Recommend ammonium lactate to help beak down scale

## 2021-05-21 ENCOUNTER — Ambulatory Visit: Payer: Medicare Other | Admitting: Urology

## 2021-06-10 ENCOUNTER — Emergency Department
Admission: EM | Admit: 2021-06-10 | Discharge: 2021-06-10 | Disposition: A | Discharge status: Home / Self Care | Payer: Medicare Other | Attending: Student in an Organized Health Care Education/Training Program | Admitting: Student in an Organized Health Care Education/Training Program

## 2021-06-10 ENCOUNTER — Other Ambulatory Visit: Payer: Self-pay

## 2021-06-10 ENCOUNTER — Emergency Department: Payer: Medicare Other

## 2021-06-10 ENCOUNTER — Encounter: Payer: Self-pay | Admitting: Emergency Medicine

## 2021-06-10 DIAGNOSIS — W01198A Fall on same level from slipping, tripping and stumbling with subsequent striking against other object, initial encounter: Secondary | ICD-10-CM | POA: Insufficient documentation

## 2021-06-10 DIAGNOSIS — S300XXA Contusion of lower back and pelvis, initial encounter: Secondary | ICD-10-CM | POA: Diagnosis not present

## 2021-06-10 DIAGNOSIS — Z79899 Other long term (current) drug therapy: Secondary | ICD-10-CM | POA: Insufficient documentation

## 2021-06-10 DIAGNOSIS — W19XXXA Unspecified fall, initial encounter: Secondary | ICD-10-CM

## 2021-06-10 DIAGNOSIS — I517 Cardiomegaly: Secondary | ICD-10-CM | POA: Diagnosis not present

## 2021-06-10 DIAGNOSIS — S8001XA Contusion of right knee, initial encounter: Secondary | ICD-10-CM | POA: Diagnosis not present

## 2021-06-10 DIAGNOSIS — M419 Scoliosis, unspecified: Secondary | ICD-10-CM | POA: Diagnosis not present

## 2021-06-10 DIAGNOSIS — S0990XA Unspecified injury of head, initial encounter: Secondary | ICD-10-CM | POA: Insufficient documentation

## 2021-06-10 DIAGNOSIS — S80911A Unspecified superficial injury of right knee, initial encounter: Secondary | ICD-10-CM | POA: Diagnosis present

## 2021-06-10 LAB — CBC WITH DIFFERENTIAL/PLATELET
Abs Immature Granulocytes: 0.02 10*3/uL (ref 0.00–0.07)
Basophils Absolute: 0 10*3/uL (ref 0.0–0.1)
Basophils Relative: 1 %
Eosinophils Absolute: 0.1 10*3/uL (ref 0.0–0.5)
Eosinophils Relative: 3 %
HCT: 34.8 % — ABNORMAL LOW (ref 36.0–46.0)
Hemoglobin: 11 g/dL — ABNORMAL LOW (ref 12.0–15.0)
Immature Granulocytes: 0 %
Lymphocytes Relative: 26 %
Lymphs Abs: 1.4 10*3/uL (ref 0.7–4.0)
MCH: 29 pg (ref 26.0–34.0)
MCHC: 31.6 g/dL (ref 30.0–36.0)
MCV: 91.8 fL (ref 80.0–100.0)
Monocytes Absolute: 0.4 10*3/uL (ref 0.1–1.0)
Monocytes Relative: 7 %
Neutro Abs: 3.3 10*3/uL (ref 1.7–7.7)
Neutrophils Relative %: 63 %
Platelets: 266 10*3/uL (ref 150–400)
RBC: 3.79 MIL/uL — ABNORMAL LOW (ref 3.87–5.11)
RDW: 13.3 % (ref 11.5–15.5)
WBC: 5.2 10*3/uL (ref 4.0–10.5)
nRBC: 0 % (ref 0.0–0.2)

## 2021-06-10 LAB — URINALYSIS, COMPLETE (UACMP) WITH MICROSCOPIC
Bacteria, UA: NONE SEEN
Bilirubin Urine: NEGATIVE
Glucose, UA: >=500 mg/dL — AB
Hgb urine dipstick: NEGATIVE
Ketones, ur: NEGATIVE mg/dL
Leukocytes,Ua: NEGATIVE
Nitrite: NEGATIVE
Protein, ur: NEGATIVE mg/dL
Specific Gravity, Urine: 1.014 (ref 1.005–1.030)
pH: 7 (ref 5.0–8.0)

## 2021-06-10 LAB — COMPREHENSIVE METABOLIC PANEL
ALT: 13 U/L (ref 0–44)
AST: 28 U/L (ref 15–41)
Albumin: 4 g/dL (ref 3.5–5.0)
Alkaline Phosphatase: 51 U/L (ref 38–126)
Anion gap: 8 (ref 5–15)
BUN: 28 mg/dL — ABNORMAL HIGH (ref 8–23)
CO2: 25 mmol/L (ref 22–32)
Calcium: 9.7 mg/dL (ref 8.9–10.3)
Chloride: 105 mmol/L (ref 98–111)
Creatinine, Ser: 1.3 mg/dL — ABNORMAL HIGH (ref 0.44–1.00)
GFR, Estimated: 40 mL/min — ABNORMAL LOW (ref >60)
Glucose, Bld: 229 mg/dL — ABNORMAL HIGH (ref 70–99)
Potassium: 3.9 mmol/L (ref 3.5–5.1)
Sodium: 138 mmol/L (ref 135–145)
Total Bilirubin: 0.8 mg/dL (ref 0.3–1.2)
Total Protein: 7.5 g/dL (ref 6.5–8.1)

## 2021-06-10 LAB — CBG MONITORING, ED: Glucose-Capillary: 209 mg/dL — ABNORMAL HIGH (ref 70–99)

## 2021-06-10 MED ORDER — OXYCODONE-ACETAMINOPHEN 5-325 MG PO TABS: 1.0000 | ORAL_TABLET | Freq: Four times a day (QID) | ORAL | 0 refills | Status: DC | PRN

## 2021-06-10 NOTE — ED Triage Notes (Signed)
Pt in via EMS from [facility]. Per staff, pt lost her balance and hit her head on TV trays. Pt with hematoma to back of head. No LOC, No blood thinners.

## 2021-06-10 NOTE — ED Provider Triage Note (Signed)
Emergency Medicine Provider Triage Evaluation Note  Martha Walker , a 88 y.o. female  was evaluated in triage.  Pt complains of headache, neck pain and low back pain after patient fell while trying to retrieve something from the refrigerator.  Patient has been unable to bear weight since injury occurred.  No chest pain, chest tightness or abdominal pain.  Review of Systems  Positive: Patient has headache, neck pain and bilateral hip pain. Negative: No chest pain, chest tightness or abdominal pain.  Physical Exam  Ht 5\' 3"  (1.6 m)    Wt 74.8 kg    BMI 29.23 kg/m  Gen:   Awake, no distress   Resp:  Normal effort  MSK:   Patient unable to perform full range of motion of the bilateral hips. Other:    Medical Decision Making  Medically screening exam initiated at 5:58 PM.  Appropriate orders placed.  [name] was informed that the remainder of the evaluation will be completed by another provider, this initial triage assessment does not replace that evaluation, and the importance of remaining in the ED until their evaluation is complete.     Vallarie Mare [place], PA-C 06/10/21 1800

## 2021-06-10 NOTE — ED Triage Notes (Signed)
Pt via EMS from [place]. Per states that she lost her balance. Pt c/o hip pain head pain, EMS reports a hematoma to the back of the head. Pt is A&OX4 and NAD.

## 2021-06-10 NOTE — ED Provider Notes (Signed)
[facility] Provider Note  Patient Contact: 7:52 PM (approximate)   History   Fall   HPI  Martha Walker is a 88 y.o. female who presents to the emergency department with her daughter after a fall.  This was described as a mechanical fall as patient was reaching into a small refrigerator at bedside to retrieve a drink.  Patient lost her balance and fell.  She did hit her head, landed on her right side.  Patient was complaining of a "goose egg" to her head, low back and right knee pain.  No other symptoms preceding her fall.  Patient did not have any loss of consciousness.  There is no vision changes.  Patient is at her baseline according to the daughter.  No radicular symptoms in the upper or lower extremity, bowel or bladder dysfunction, saddle anesthesia or paresthesias.     Physical Exam   Triage Vital Signs: ED Triage Vitals  Enc Vitals Group     BP 06/10/21 1759 (!) 162/67     Pulse Rate 06/10/21 1759 65     Resp 06/10/21 1759 16     Temp 06/10/21 1759 (!) 97.5 F (36.4 C)     Temp Source 06/10/21 1759 Oral     SpO2 06/10/21 1759 98 %     Weight 06/10/21 1756 165 lb (74.8 kg)     Height 06/10/21 1756 5\' 3"  (1.6 m)     Head Circumference --      Peak Flow --      Pain Score 06/10/21 1756 0     Pain Loc --      Pain Edu? --      Excl. in [employer]? --     Most recent vital signs: Vitals:   06/10/21 1759  BP: (!) 162/67  Pulse: 65  Resp: 16  Temp: (!) 97.5 F (36.4 C)  SpO2: 98%     General: Alert and in no acute distress. Head: Hematoma noted to the right occipital skull.  No open wounds.  No battle signs, raccoon eyes, serosanguineous fluid drainage from the ears or nares.  Neck: No stridor. No cervical spine tenderness to palpation.  Cardiovascular:  Good peripheral perfusion Respiratory: Normal respiratory effort without tachypnea or retractions. Lungs CTAB. Good air entry to the bases with no decreased or absent breath  sounds. Musculoskeletal: No shortening or rotation of the right lower extremity.  Patient was nontender palpation over the lateral hip.  Slightly tender to palpation of the right knee but is moving fully at this time.  Special tests were negative.  Dorsalis pedis pulses sensation intact distally. Neurologic:  No gross focal neurologic deficits are appreciated.  Skin:   No rash noted Other:   ED Results / Procedures / Treatments   Labs (all labs ordered are listed, but only abnormal results are displayed) Labs Reviewed  CBC WITH DIFFERENTIAL/PLATELET - Abnormal; Notable for the following components:      Result Value   RBC 3.79 (*)    Hemoglobin 11.0 (*)    HCT 34.8 (*)    All other components within normal limits  COMPREHENSIVE METABOLIC PANEL - Abnormal; Notable for the following components:   Glucose, Bld 229 (*)    BUN 28 (*)    Creatinine, Ser 1.30 (*)    GFR, Estimated 40 (*)    All other components within normal limits  URINALYSIS, COMPLETE (UACMP) WITH MICROSCOPIC - Abnormal; Notable for the following components:   Color, Urine  STRAW (*)    APPearance CLEAR (*)    Glucose, UA >=500 (*)    All other components within normal limits  CBG MONITORING, ED - Abnormal; Notable for the following components:   Glucose-Capillary 209 (*)    All other components within normal limits     EKG     RADIOLOGY  I personally viewed and evaluated these images as part of my medical decision making, as well as reviewing the written report by the radiologist.  ED Provider Interpretation: No acute traumatic findings found on CT head, cervical spine, lumbar spine, chest x-ray, hip or knee imaging.  DG Chest 1 View  Result Date: 06/10/2021 CLINICAL DATA:  Lost balance leading to fall. EXAM: CHEST  1 VIEW COMPARISON:  05/09/2019 FINDINGS: Unchanged cardiomegaly. Unchanged mediastinal contours with aortic atherosclerosis. No pneumothorax, pleural effusion, or focal airspace disease. Bones  are under mineralized. No acute osseous abnormalities are seen. IMPRESSION: 1. No acute abnormality. 2. Stable cardiomegaly. Electronically Signed   By: Keith Rake M.D.   On: 06/10/2021 19:16   CT Head Wo Contrast  Result Date: 06/10/2021 CLINICAL DATA:  Head trauma, intracranial venous injury suspected EXAM: CT HEAD WITHOUT CONTRAST TECHNIQUE: Contiguous axial images were obtained from the base of the skull through the vertex without intravenous contrast. COMPARISON:  Head CT 05/09/2019 FINDINGS: Brain: Age related atrophy. No intracranial hemorrhage, mass effect, or midline shift. No hydrocephalus. The basilar cisterns are patent. Remote lacunar infarct in the left caudate, unchanged. No evidence of territorial infarct or acute ischemia. No extra-axial or intracranial fluid collection. Vascular: Atherosclerosis of skullbase vasculature without hyperdense vessel or abnormal calcification. Skull: No fracture or focal lesion. Sinuses/Orbits: No acute findings. Right cataract resection. Cerumen in bilateral external ear canal, left greater than right. Other: No confluent scalp hematoma. IMPRESSION: 1. No acute intracranial abnormality. No skull fracture. 2. Age related atrophy. Remote lacunar infarct in the left caudate. Electronically Signed   By: Keith Rake M.D.   On: 06/10/2021 18:45   CT Cervical Spine Wo Contrast  Result Date: 06/10/2021 CLINICAL DATA:  Neck trauma (Age >= 65y) EXAM: CT CERVICAL SPINE WITHOUT CONTRAST TECHNIQUE: Multidetector CT imaging of the cervical spine was performed without intravenous contrast. Multiplanar CT image reconstructions were also generated. COMPARISON:  None. FINDINGS: Alignment: Slight exaggerated cervical lordosis. No traumatic subluxation. Facets are normally aligned. Skull base and vertebrae: No acute fracture. Vertebral body heights are maintained. The dens and skull base are intact. Soft tissues and spinal canal: No prevertebral fluid or swelling. No  visible canal hematoma. Disc levels: Minor spondylosis with preservation of disc spaces. Disc bulge at C5-C6. Upper chest: 18 mm nodule posterior to the right thyroid gland may represent an exophytic thyroid nodule or parathyroid nodule. In the setting of significant comorbidities or limited life expectancy, no follow-up recommended (ref: J Am Coll Radiol. 2015 Feb;12(2): 143-50). Other: Carotid calcifications. IMPRESSION: 1. No acute fracture or subluxation of the cervical spine. 2. An 18 mm nodule posterior to the right thyroid gland may represent an exophytic thyroid nodule or parathyroid nodule. In the setting of significant comorbidities or limited life expectancy, no follow-up recommended (ref: J Am Coll Radiol. 2015 Feb;12(2): 143-50). Electronically Signed   By: Keith Rake M.D.   On: 06/10/2021 18:49   CT Lumbar Spine Wo Contrast  Result Date: 06/10/2021 CLINICAL DATA:  Back trauma EXAM: CT LUMBAR SPINE WITHOUT CONTRAST TECHNIQUE: Multidetector CT imaging of the lumbar spine was performed without intravenous contrast administration. Multiplanar CT image  reconstructions were also generated. COMPARISON:  None. FINDINGS: Segmentation: 5 lumbar type vertebrae. Alignment: S shaped scoliosis of the lumbar spine. Vertebrae: No acute fracture or focal pathologic process. Paraspinal and other soft tissues: Aortic atherosclerosis. Diverticular disease of the sigmoid colon Disc levels: At T12-L1, maintained disc space. No canal stenosis. The foramen are patent. At L1-L2, maintained disc space. No canal stenosis. The foramen are patent bilaterally. At L2-L3, maintained disc space. Mild diffuse disc bulge without canal stenosis. Facet degenerative changes bilaterally. The foramen are patent bilaterally. At L3-L4, moderate disc space narrowing with vacuum disc. Mild diffuse disc bulge. Ligamentum flavum thickening and hypertrophic facet degenerative changes with mild canal stenosis. Mild right foraminal  narrowing. At L4-L5, mild vacuum disc. Mild diffuse disc bulge. Hypertrophic facet degenerative changes. No high-grade canal stenosis. Mild left foraminal narrowing. At L5-S1, moderate disc space narrowing and vacuum disc. No canal stenosis. Hypertrophic facet degenerative change. Mild right and moderate severe left foraminal stenosis. IMPRESSION: 1. No acute osseous abnormality. 2. Scoliosis and multilevel degenerative changes as above. Electronically Signed   By: Donavan Foil M.D.   On: 06/10/2021 18:50   DG Knee Complete 4 Views Right  Result Date: 06/10/2021 CLINICAL DATA:  Status post fall.  Knee pain. EXAM: RIGHT KNEE - COMPLETE 4+ VIEW COMPARISON:  None. FINDINGS: No acute abnormality. No fracture or dislocation. No joint effusion identified. 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 This is most advanced within the medial compartment. Extensive vascular calcifications noted. IMPRESSION: 1. No acute findings. 2. 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 3. Extensive vascular calcifications. Electronically Signed   By: Kerby Moors M.D.   On: 06/10/2021 19:59   DG Hip Unilat W or Wo Pelvis 2-3 Views Left  Result Date: 06/10/2021 CLINICAL DATA:  Lost balance leading to fall.  Bilateral hip pain. EXAM: DG HIP (WITH OR WITHOUT PELVIS) 2-3V LEFT; DG HIP (WITH OR WITHOUT PELVIS) 2-3V RIGHT COMPARISON:  None. FINDINGS: The bones are under mineralized. No acute fracture of the pelvis or hips. Both femoral heads are seated in the respective acetabula. Mild bilateral hip osteoarthritis. Intact pubic rami. Pubic symphysis and sacroiliac joints are congruent. Advanced vascular calcifications. IMPRESSION: No acute fracture of the pelvis or hips. Electronically Signed   By: Keith Rake M.D.   On: 06/10/2021 19:15   DG Hip Unilat W or Wo Pelvis 2-3 Views Right  Result Date: 06/10/2021 CLINICAL DATA:  Lost balance leading to fall.  Bilateral hip pain. EXAM: DG HIP (WITH OR  WITHOUT PELVIS) 2-3V LEFT; DG HIP (WITH OR WITHOUT PELVIS) 2-3V RIGHT COMPARISON:  None. FINDINGS: The bones are under mineralized. No acute fracture of the pelvis or hips. Both femoral heads are seated in the respective acetabula. Mild bilateral hip osteoarthritis. Intact pubic rami. Pubic symphysis and sacroiliac joints are congruent. Advanced vascular calcifications. IMPRESSION: No acute fracture of the pelvis or hips. Electronically Signed   By: Keith Rake M.D.   On: 06/10/2021 19:15    PROCEDURES:  Critical Care performed: No  Procedures   MEDICATIONS ORDERED IN ED: Medications - No data to display   IMPRESSION / MDM / [place] / ED COURSE  I reviewed the triage vital signs and the nursing notes.                              Differential diagnosis includes, but is not limited to, CVA, intracranial hemorrhage, skull fracture, cervical spine fracture, lumbar fracture,  hip fracture, knee fracture.     Patient's diagnosis is consistent with fall, multiple contusions.  Patient presented to the emergency department after having a mechanical fall.  She had bent over to get a drink out of a mini fridge that was sitting on the floor, tried to straighten up to catch her balance and fell backwards hitting her head.  No loss of consciousness.  Neurologically intact on exam.  She did have a hematoma to the skull, was tender over the hip and knee.  Patient had imaging of the head, neck, chest, lumbar spine, hip and knee.  Imaging is reassuring with no acute traumatic findings specifically no intracranial hemorrhage, fractures to the spine, hip or knee.  At this time exam was reassuring.  Given the mechanism of injury, the complaints there was concern the patient could require admission however with imaging being reassuring, patient is still neurologically intact feel that she is safe for discharge.  Labs of CBC, CMP and urinalysis are reassuring at this time.  Slightly elevated blood  glucose there is no symptoms and no elevation of her gap to be concerned at this time.  She takes oral medications with no insulin I do not feel that insulin is needed at this time.  Return precautions discussed with the patient and her daughter.  Follow-up with primary care as needed..  Patient is given ED precautions to return to the ED for any worsening or new symptoms.    Clinical Course as of 06/10/21 2151  Nancy Fetter Jun 10, 2021  1910 CT Head Wo Contrast [AE]  2028 GFR, Estimated(!): 40 [AE]    Clinical Course User Index [AE] Prince Solian, Student-PA     FINAL CLINICAL IMPRESSION(S) / ED DIAGNOSES   Final diagnoses:  Fall  Fall, initial encounter  Minor head injury, initial encounter  Contusion of right knee, initial encounter  Contusion of coccyx, initial encounter     Rx / DC Orders   ED Discharge Orders          Ordered    oxyCODONE-acetaminophen (PERCOCET/ROXICET) 5-325 MG tablet  Every 6 hours PRN        06/10/21 2147             Note:  This document was prepared using Dragon voice recognition software and may include unintentional dictation errors.   Brynda Peon 06/10/21 2151    Merlyn Lot, MD 06/10/21 2213

## 2021-06-10 NOTE — ED Notes (Signed)
Urine sent to lab 

## 2021-07-09 ENCOUNTER — Ambulatory Visit (INDEPENDENT_AMBULATORY_CARE_PROVIDER_SITE_OTHER): Payer: Medicare Other | Admitting: Urology

## 2021-07-09 ENCOUNTER — Other Ambulatory Visit: Payer: Self-pay

## 2021-07-09 ENCOUNTER — Encounter: Payer: Self-pay | Admitting: Urology

## 2021-07-09 VITALS — BP 145/66 | HR 63 | Ht 63.0 in | Wt 165.0 lb

## 2021-07-09 DIAGNOSIS — N3946 Mixed incontinence: Secondary | ICD-10-CM

## 2021-07-09 MED ORDER — MIRABEGRON ER 50 MG PO TB24: 50.0000 mg | ORAL_TABLET | Freq: Every day | ORAL | 11 refills | Status: AC

## 2021-07-09 NOTE — Progress Notes (Signed)
07/09/2021 10:35 AM   Martha Walker 1931/10/05 [id number]  Referring provider: Leonel Ramsay, MD [place],  [place] [id number]  Chief Complaint  Patient presents with   Follow-up    HPI: I was consulted to assess the patient's incontinence and frequency.  She voids every 1 hour or more frequently.  She gets up 2-4 times at night.  She cannot hold it for 2 hours.  I believe she has urge incontinence but no stress incontinence.  She changes tissue.  She has no bedwetting.  Some of the details were more difficult to ascertain today    I would like to try the patient on Myrbetriq 50 mg samples and prescription.  I will see her back in about 6 weeks and perform a pelvic examination cystoscopy then.  I can get a urine culture then if needed.  She is in a nursing facility and we also ordered time voiding and for them to get a urine culture if they are able   Patient did not want cystoscopy.  Again she was quite vague but she said the medicine she thought was helping.  She really could not quantitate it.  She said she had more control and less urge incontinence.  She could not tell me if she actually fill the prescription.  History again was difficult   More samples and prescription given.  Reassess 8 weeks.  Percutaneous tibial nerve stimulation and and other overactive bladder medications are options.  I think we will need to have reasonable treatment goal  Today Urgency incontinence much better on the Myrbetriq.  She has been on it for more than a year.  No infections.  Frequency stable.         PMH: Past Medical History:  Diagnosis Date   Ankle edema, bilateral    Atrophic vaginitis    B12 deficiency    Bilateral carotid artery stenosis    Chronic anemia    CKD stage 3 due to type 2 diabetes mellitus (HCC)    Dementia (HCC)    Diabetes mellitus without complication ([place])    Diverticulitis    Hyperlipidemia    Hypertension    Osteoporosis    PVD  (peripheral vascular disease) ([place])    Vitamin D deficiency     Surgical History: Past Surgical History:  Procedure Laterality Date   CATARACT EXTRACTION W/PHACO Right 02/21/2021   Procedure: CATARACT EXTRACTION PHACO AND INTRAOCULAR LENS PLACEMENT ([place]) RIGHT KAHOOK DUAL BLADE GONIOTOMY;  Surgeon: Leandrew Koyanagi, MD;  Location: [facility];  Service: Ophthalmology;  Laterality: Right;  12.45 01:20.1    Home Medications:  Allergies as of 07/09/2021       Reactions   Amlodipine Cough   Amlodipine Besy-benazepril Hcl    Other reaction(s): Unknown   Atorvastatin Nausea And Vomiting   Onset 02/08/2011.   Augmentin [amoxicillin-pot Clavulanate]    Bactrim [sulfamethoxazole-trimethoprim]    Etodolac    Fosinopril    Ibudone [hydrocodone-ibuprofen]    Januvia [sitagliptin]    Lipitor [atorvastatin Calcium]    Metronidazole    Mobic [meloxicam]    Penicillins    Pravastatin    Simvastatin    Vicodin [hydrocodone-acetaminophen]    Tylenol [acetaminophen] Rash        Medication List        Accurate as of July 09, 2021 10:35 AM. If you have any questions, ask your nurse or doctor.          acetaminophen 325  MG tablet Commonly known as: TYLENOL Take by mouth.   alum & mag hydroxide-simeth 200-200-20 MG/5ML suspension Commonly known as: MAALOX/MYLANTA Take 30 mLs by mouth every 6 (six) hours as needed for indigestion or heartburn.   brimonidine 0.2 % ophthalmic solution Commonly known as: ALPHAGAN Place 1 drop into both eyes 2 (two) times daily.   cetirizine 10 MG tablet Commonly known as: ZYRTEC Take 10 mg by mouth daily.   Cholecalciferol 1.25 MG (50000 UT) capsule Take by mouth.   conjugated estrogens 0.625 MG/GM vaginal cream Commonly known as: PREMARIN Place 1 Applicatorful vaginally as directed. Twice weekly   diclofenac Sodium 1 % Gel Commonly known as: VOLTAREN Apply 2 g topically 4 (four) times daily as needed.   docusate sodium  100 MG capsule Commonly known as: COLACE Take 100 mg by mouth daily.   dorzolamide-timolol 22.3-6.8 MG/ML ophthalmic solution Commonly known as: COSOPT 1 drop 2 (two) times daily.   Fifty50 Glucose Meter 2.0 w/Device Kit Use as directed. DX:E11.9   GaviLAX 17 GM/SCOOP powder Generic drug: polyethylene glycol powder Take by mouth.   hydrALAZINE 50 MG tablet Commonly known as: APRESOLINE Take 50 mg by mouth 2 (two) times daily.   hydrocortisone 2.5 % cream Apply topically 2 (two) times daily as needed (Rash).   ketoconazole 2 % shampoo Commonly known as: NIZORAL Massage into scalp and let sit 10 minutes before washing out twice weekly.   latanoprost 0.005 % ophthalmic solution Commonly known as: XALATAN Place 1 drop into both eyes at bedtime.   loperamide 2 MG capsule Commonly known as: IMODIUM Take 2 mg by mouth as needed for diarrhea or loose stools.   magnesium hydroxide 400 MG/5ML suspension Commonly known as: MILK OF MAGNESIA Take 30 mLs by mouth daily as needed for mild constipation.   metFORMIN 500 MG tablet Commonly known as: GLUCOPHAGE Take 500 mg by mouth daily with breakfast.   mirabegron ER 50 MG Tb24 tablet Commonly known as: MYRBETRIQ Take 1 tablet (50 mg total) by mouth daily.   mometasone 0.1 % lotion Commonly known as: ELOCON Apply daily as needed to scalp for itch or scale. Avoid applying to face, groin, and axilla.   oxyCODONE-acetaminophen 5-325 MG tablet Commonly known as: PERCOCET/ROXICET Take 1 tablet by mouth every 6 (six) hours as needed for severe pain.   Rhopressa 0.02 % Soln Generic drug: Netarsudil Dimesylate Place 1 drop into the right eye at bedtime.   rosuvastatin 5 MG tablet Commonly known as: CRESTOR Take 5 mg by mouth daily.   Senna-Tabs 8.6 MG tablet Generic drug: senna Take by mouth daily as needed.   triamcinolone ointment 0.1 % Commonly known as: KENALOG Apply to affected areas twice daily as needed for itchy  rash at back. Avoid applying to face, groin, and axilla. Use as directed. Risk of skin atrophy with long-term use reviewed.   valsartan 80 MG tablet Commonly known as: DIOVAN Take 80 mg by mouth daily.   vitamin B-12 1000 MCG tablet Commonly known as: CYANOCOBALAMIN Take 1,000 mcg by mouth daily.   VITAMIN D PO Take 1,250 Units by mouth daily.   Vyzulta 0.024 % Soln Generic drug: Latanoprostene Bunod Apply to eye.        Allergies:  Allergies  Allergen Reactions   Amlodipine Cough   Amlodipine Besy-Benazepril Hcl     Other reaction(s): Unknown   Atorvastatin Nausea And Vomiting    Onset 02/08/2011.   Augmentin [Amoxicillin-Pot Clavulanate]    Bactrim [Sulfamethoxazole-Trimethoprim]  Etodolac    Fosinopril    Ibudone [Hydrocodone-Ibuprofen]    Januvia [Sitagliptin]    Lipitor [Atorvastatin Calcium]    Metronidazole    Mobic [Meloxicam]    Penicillins    Pravastatin    Simvastatin    Vicodin [Hydrocodone-Acetaminophen]    Tylenol [Acetaminophen] Rash    Family History: No family history on file.  Social History:  reports that she has never smoked. She has never used smokeless tobacco. She reports that she does not drink alcohol and does not use drugs.  ROS:                                        Physical Exam: There were no vitals taken for this visit.  Constitutional:  Alert and oriented, No acute distress. HEENT: [place] AT, moist mucus membranes.  Trachea midline, no masses.   Laboratory Data: Lab Results  Component Value Date   WBC 5.2 06/10/2021   HGB 11.0 (L) 06/10/2021   HCT 34.8 (L) 06/10/2021   MCV 91.8 06/10/2021   PLT 266 06/10/2021    Lab Results  Component Value Date   CREATININE 1.30 (H) 06/10/2021    No results found for: PSA  No results found for: TESTOSTERONE  No results found for: HGBA1C  Urinalysis    Component Value Date/Time   COLORURINE STRAW (A) 06/10/2021 2107   APPEARANCEUR CLEAR (A)  06/10/2021 2107   APPEARANCEUR Cloudy (A) 01/10/2020 1510   LABSPEC 1.014 06/10/2021 2107   PHURINE 7.0 06/10/2021 2107   GLUCOSEU >=500 (A) 06/10/2021 2107   HGBUR NEGATIVE 06/10/2021 2107   BILIRUBINUR NEGATIVE 06/10/2021 2107   BILIRUBINUR Negative 01/10/2020 [place] 06/10/2021 2107   PROTEINUR NEGATIVE 06/10/2021 2107   NITRITE NEGATIVE 06/10/2021 2107   LEUKOCYTESUR NEGATIVE 06/10/2021 2107    Pertinent Imaging:   Assessment & Plan:  Myrbetriq 19x3 sent to pharmacy and I will see her in 1 year  There are no diagnoses linked to this encounter.  No follow-ups on file.  Reece Packer, MD  [place] [address], [place], [place] [address] [phone]

## 2022-02-03 ENCOUNTER — Other Ambulatory Visit: Payer: Self-pay

## 2022-02-03 ENCOUNTER — Inpatient Hospital Stay
Admission: EM | Admit: 2022-02-03 | Discharge: 2022-02-15 | DRG: 981 | Disposition: A | Discharge status: Skilled Nursing Facility | Payer: Medicare Other | Source: Skilled Nursing Facility | Attending: Obstetrics and Gynecology | Admitting: Obstetrics and Gynecology

## 2022-02-03 ENCOUNTER — Emergency Department: Payer: Medicare Other

## 2022-02-03 DIAGNOSIS — Z79899 Other long term (current) drug therapy: Secondary | ICD-10-CM

## 2022-02-03 DIAGNOSIS — Z888 Allergy status to other drugs, medicaments and biological substances status: Secondary | ICD-10-CM

## 2022-02-03 DIAGNOSIS — Z882 Allergy status to sulfonamides status: Secondary | ICD-10-CM

## 2022-02-03 DIAGNOSIS — R4182 Altered mental status, unspecified: Secondary | ICD-10-CM

## 2022-02-03 DIAGNOSIS — N3000 Acute cystitis without hematuria: Secondary | ICD-10-CM | POA: Diagnosis not present

## 2022-02-03 DIAGNOSIS — D631 Anemia in chronic kidney disease: Secondary | ICD-10-CM | POA: Diagnosis present

## 2022-02-03 DIAGNOSIS — U071 COVID-19: Secondary | ICD-10-CM

## 2022-02-03 DIAGNOSIS — Z880 Allergy status to penicillin: Secondary | ICD-10-CM

## 2022-02-03 DIAGNOSIS — Z79818 Long term (current) use of other agents affecting estrogen receptors and estrogen levels: Secondary | ICD-10-CM

## 2022-02-03 DIAGNOSIS — I129 Hypertensive chronic kidney disease with stage 1 through stage 4 chronic kidney disease, or unspecified chronic kidney disease: Secondary | ICD-10-CM | POA: Diagnosis present

## 2022-02-03 DIAGNOSIS — M810 Age-related osteoporosis without current pathological fracture: Secondary | ICD-10-CM | POA: Diagnosis present

## 2022-02-03 DIAGNOSIS — E785 Hyperlipidemia, unspecified: Secondary | ICD-10-CM | POA: Diagnosis present

## 2022-02-03 DIAGNOSIS — E1151 Type 2 diabetes mellitus with diabetic peripheral angiopathy without gangrene: Secondary | ICD-10-CM | POA: Diagnosis present

## 2022-02-03 DIAGNOSIS — L8990 Pressure ulcer of unspecified site, unspecified stage: Secondary | ICD-10-CM | POA: Insufficient documentation

## 2022-02-03 DIAGNOSIS — A0839 Other viral enteritis: Secondary | ICD-10-CM | POA: Diagnosis present

## 2022-02-03 DIAGNOSIS — Z886 Allergy status to analgesic agent status: Secondary | ICD-10-CM

## 2022-02-03 DIAGNOSIS — F0392 Unspecified dementia, unspecified severity, with psychotic disturbance: Secondary | ICD-10-CM | POA: Diagnosis present

## 2022-02-03 DIAGNOSIS — E860 Dehydration: Secondary | ICD-10-CM | POA: Diagnosis present

## 2022-02-03 DIAGNOSIS — E440 Moderate protein-calorie malnutrition: Secondary | ICD-10-CM | POA: Diagnosis present

## 2022-02-03 DIAGNOSIS — Z881 Allergy status to other antibiotic agents status: Secondary | ICD-10-CM

## 2022-02-03 DIAGNOSIS — N1831 Chronic kidney disease, stage 3a: Secondary | ICD-10-CM | POA: Diagnosis present

## 2022-02-03 DIAGNOSIS — G9341 Metabolic encephalopathy: Secondary | ICD-10-CM | POA: Diagnosis present

## 2022-02-03 DIAGNOSIS — Z7984 Long term (current) use of oral hypoglycemic drugs: Secondary | ICD-10-CM

## 2022-02-03 DIAGNOSIS — R0902 Hypoxemia: Secondary | ICD-10-CM | POA: Diagnosis present

## 2022-02-03 DIAGNOSIS — N179 Acute kidney failure, unspecified: Secondary | ICD-10-CM | POA: Diagnosis present

## 2022-02-03 DIAGNOSIS — L89152 Pressure ulcer of sacral region, stage 2: Secondary | ICD-10-CM | POA: Diagnosis present

## 2022-02-03 DIAGNOSIS — E1122 Type 2 diabetes mellitus with diabetic chronic kidney disease: Secondary | ICD-10-CM | POA: Diagnosis present

## 2022-02-03 DIAGNOSIS — I495 Sick sinus syndrome: Secondary | ICD-10-CM | POA: Diagnosis present

## 2022-02-03 DIAGNOSIS — R001 Bradycardia, unspecified: Secondary | ICD-10-CM | POA: Diagnosis present

## 2022-02-03 DIAGNOSIS — I701 Atherosclerosis of renal artery: Secondary | ICD-10-CM | POA: Diagnosis present

## 2022-02-03 DIAGNOSIS — I6523 Occlusion and stenosis of bilateral carotid arteries: Secondary | ICD-10-CM | POA: Diagnosis present

## 2022-02-03 DIAGNOSIS — Z885 Allergy status to narcotic agent status: Secondary | ICD-10-CM

## 2022-02-03 DIAGNOSIS — I959 Hypotension, unspecified: Secondary | ICD-10-CM | POA: Diagnosis not present

## 2022-02-03 DIAGNOSIS — Z006 Encounter for examination for normal comparison and control in clinical research program: Secondary | ICD-10-CM

## 2022-02-03 DIAGNOSIS — R627 Adult failure to thrive: Secondary | ICD-10-CM | POA: Diagnosis present

## 2022-02-03 DIAGNOSIS — Z6824 Body mass index (BMI) 24.0-24.9, adult: Secondary | ICD-10-CM

## 2022-02-03 LAB — URINALYSIS, ROUTINE W REFLEX MICROSCOPIC
Bilirubin Urine: NEGATIVE
Glucose, UA: >=500 mg/dL — AB
Hgb urine dipstick: NEGATIVE
Ketones, ur: 5 mg/dL — AB
Nitrite: NEGATIVE
Protein, ur: 100 mg/dL — AB
Specific Gravity, Urine: 1.017 (ref 1.005–1.030)
WBC, UA: >50 WBC/hpf — ABNORMAL HIGH (ref 0–5)
pH: 5 (ref 5.0–8.0)

## 2022-02-03 LAB — SARS CORONAVIRUS 2 BY RT PCR: SARS Coronavirus 2 by RT PCR: POSITIVE — AB

## 2022-02-03 LAB — COMPREHENSIVE METABOLIC PANEL
ALT: 11 U/L (ref 0–44)
AST: 26 U/L (ref 15–41)
Albumin: 4.2 g/dL (ref 3.5–5.0)
Alkaline Phosphatase: 52 U/L (ref 38–126)
Anion gap: 17 — ABNORMAL HIGH (ref 5–15)
BUN: 76 mg/dL — ABNORMAL HIGH (ref 8–23)
CO2: 21 mmol/L — ABNORMAL LOW (ref 22–32)
Calcium: 10.1 mg/dL (ref 8.9–10.3)
Chloride: 105 mmol/L (ref 98–111)
Creatinine, Ser: 2.15 mg/dL — ABNORMAL HIGH (ref 0.44–1.00)
GFR, Estimated: 22 mL/min — ABNORMAL LOW (ref >60)
Glucose, Bld: 153 mg/dL — ABNORMAL HIGH (ref 70–99)
Potassium: 4.7 mmol/L (ref 3.5–5.1)
Sodium: 143 mmol/L (ref 135–145)
Total Bilirubin: 1.1 mg/dL (ref 0.3–1.2)
Total Protein: 8.2 g/dL — ABNORMAL HIGH (ref 6.5–8.1)

## 2022-02-03 LAB — CBC
HCT: 36.5 % (ref 36.0–46.0)
Hemoglobin: 11.5 g/dL — ABNORMAL LOW (ref 12.0–15.0)
MCH: 28.5 pg (ref 26.0–34.0)
MCHC: 31.5 g/dL (ref 30.0–36.0)
MCV: 90.6 fL (ref 80.0–100.0)
Platelets: 301 10*3/uL (ref 150–400)
RBC: 4.03 MIL/uL (ref 3.87–5.11)
RDW: 12.9 % (ref 11.5–15.5)
WBC: 7.6 10*3/uL (ref 4.0–10.5)
nRBC: 0 % (ref 0.0–0.2)

## 2022-02-03 LAB — TROPONIN I (HIGH SENSITIVITY): Troponin I (High Sensitivity): 26 ng/L — ABNORMAL HIGH (ref <18)

## 2022-02-03 MED ORDER — SODIUM CHLORIDE 0.9 % IV SOLN
2.0000 g | Freq: Once | INTRAVENOUS | Status: AC
  Administered 2022-02-03: 2 g via INTRAVENOUS
  Filled 2022-02-03: qty 20

## 2022-02-03 MED ORDER — SODIUM CHLORIDE 0.9 % IV BOLUS
500.0000 mL | Freq: Once | INTRAVENOUS | Status: AC
  Administered 2022-02-03: 500 mL via INTRAVENOUS

## 2022-02-03 NOTE — ED Provider Notes (Signed)
[facility] Provider Note    Event Date/Time   First MD Initiated Contact with Patient 02/03/22 2123     (approximate)   History   Altered Mental Status (Pt had Covid last week and has since not had an appetite, continues to have N/V/D. Confusion noted past few days. Pt normally A&Ox4. EMS reports patient normally able to walk around without issue but has not been getting out of chair for past few days. EMS also reports patient complaint of back pain, no report of falls. ) and Dysuria (Pt reports burning with urination. Daughter reports persistent cough since having Covid as well. )   HPI  Martha Walker is a 88 y.o. female who  comes in with nausea vomiting diarrhea.  They have noticed worsening confusion over the past few days.  She is normally alert and oriented x4.  But patient is also not been get out her room.  Normally she ambulates without any assistance.  Family was concerned also about some hallucinations and more confusion than normal.  Contrary to the report from EMS patient was never diagnosed with COVID per patient's daughter.  Patient's daughter does not know of any known falls.  She does report some back pain and belly pain. Physical Exam   Triage Vital Signs: ED Triage Vitals  Enc Vitals Group     BP 02/03/22 2103 (!) 118/56     Pulse Rate 02/03/22 2103 66     Resp 02/03/22 2103 16     Temp 02/03/22 2103 98.4 F (36.9 C)     Temp Source 02/03/22 2103 Oral     SpO2 02/03/22 2103 97 %     Weight 02/03/22 2105 170 lb (77.1 kg)     Height --      Head Circumference --      Peak Flow --      Pain Score 02/03/22 2103 2     Pain Loc --      Pain Edu? --      Excl. in [place]? --     Most recent vital signs: Vitals:   02/03/22 2103  BP: (!) 118/56  Pulse: 66  Resp: 16  Temp: 98.4 F (36.9 C)  SpO2: 97%     General: Awake, no distress.  CV:  Good peripheral perfusion.  Resp:  Normal effort.  Abd:  No distention.  Tender in the lower  abdomen Other:  Alert and oriented x2.  Able to move both legs.  Equal strength in her arms.  No cranial nerve deficits   ED Results / Procedures / Treatments   Labs (all labs ordered are listed, but only abnormal results are displayed) Labs Reviewed  SARS CORONAVIRUS 2 BY RT PCR  COMPREHENSIVE METABOLIC PANEL  CBC  URINALYSIS, ROUTINE W REFLEX MICROSCOPIC  CBG MONITORING, ED     EKG  My interpretation of EKG:  Sinus rate of 70 without any ST elevation, T wave inversion in V3 with right bundle branch block and left posterior fascicular block.  Reviewed prior EKG and looks similar   RADIOLOGY I have reviewed the chest x-ray personally interpreted no evidence of any pneumonia or edema  PROCEDURES:  Critical Care performed: No  .1-3 Lead EKG Interpretation  Performed by: Vanessa [place], MD Authorized by: Vanessa Caro, MD     Interpretation: normal     ECG rate:  60   ECG rate assessment: normal     Rhythm: sinus rhythm  Ectopy: none     Conduction: normal      MEDICATIONS ORDERED IN ED: Medications  cefTRIAXone (ROCEPHIN) 2 g in sodium chloride 0.9 % 100 mL IVPB (2 g Intravenous New Bag/Given 02/03/22 2229)  sodium chloride 0.9 % bolus 500 mL (500 mLs Intravenous New Bag/Given 02/03/22 2230)     IMPRESSION / MDM / ASSESSMENT AND PLAN / ED COURSE  I reviewed the triage vital signs and the nursing notes.   Patient's presentation is most consistent with acute presentation with potential threat to life or bodily function.   Patient comes in with altered mental status, confusion, decreased appetite.  Labs ordered evaluate for any Electra abnormalities, AKI, CT head evaluate for any cranial hemorrhage CT abdomen evaluate for any abdominal pathology.  Per daughter she is not aware of patient ever been diagnosed with COVID.  UA does look consistent with UTI.  Will send for culture.  CBC shows stable hemoglobin.  He denies any known anaphylaxis to penicillin so okay  with proceeding with ceftriaxone.   Patient is noted to have COVID but chest x-ray without evidence of any of pneumonia.  Labs do show elements of AKI.  CBC is reassuring.  Will admit patient for AKI, COVID, UTI  The patient is on the cardiac monitor to evaluate for evidence of arrhythmia and/or significant heart rate changes.      FINAL CLINICAL IMPRESSION(S) / ED DIAGNOSES   Final diagnoses:  Acute cystitis without hematuria  Altered mental status, unspecified altered mental status type  AKI (acute kidney injury) ([place])  COVID-19     Rx / DC Orders   ED Discharge Orders     None        Note:  This document was prepared using Dragon voice recognition software and may include unintentional dictation errors.   Vanessa [place], MD 02/03/22 2250

## 2022-02-03 NOTE — ED Triage Notes (Signed)
Pt had Covid last week and has since not had an appetite, continues to have N/V/D. Confusion noted past few days. Pt disoriented to event and time upon triage; normally A&Ox4. EMS reports patient normally able to walk around without issue but has not been getting out of chair for past few days. EMS also reports patient complaint of back pain, no report of falls.  Pt reports burning with urination. Daughter reports persistent cough since having Covid as well.

## 2022-02-03 NOTE — ED Notes (Signed)
EMS [id number]

## 2022-02-04 ENCOUNTER — Other Ambulatory Visit: Payer: Self-pay

## 2022-02-04 DIAGNOSIS — G9341 Metabolic encephalopathy: Secondary | ICD-10-CM | POA: Diagnosis present

## 2022-02-04 DIAGNOSIS — Z881 Allergy status to other antibiotic agents status: Secondary | ICD-10-CM | POA: Diagnosis not present

## 2022-02-04 DIAGNOSIS — I495 Sick sinus syndrome: Secondary | ICD-10-CM | POA: Diagnosis present

## 2022-02-04 DIAGNOSIS — N1831 Chronic kidney disease, stage 3a: Secondary | ICD-10-CM | POA: Diagnosis present

## 2022-02-04 DIAGNOSIS — E1122 Type 2 diabetes mellitus with diabetic chronic kidney disease: Secondary | ICD-10-CM | POA: Diagnosis present

## 2022-02-04 DIAGNOSIS — I6523 Occlusion and stenosis of bilateral carotid arteries: Secondary | ICD-10-CM | POA: Diagnosis present

## 2022-02-04 DIAGNOSIS — Z6824 Body mass index (BMI) 24.0-24.9, adult: Secondary | ICD-10-CM | POA: Diagnosis not present

## 2022-02-04 DIAGNOSIS — U071 COVID-19: Secondary | ICD-10-CM | POA: Diagnosis present

## 2022-02-04 DIAGNOSIS — Z515 Encounter for palliative care: Secondary | ICD-10-CM | POA: Diagnosis not present

## 2022-02-04 DIAGNOSIS — I129 Hypertensive chronic kidney disease with stage 1 through stage 4 chronic kidney disease, or unspecified chronic kidney disease: Secondary | ICD-10-CM | POA: Diagnosis present

## 2022-02-04 DIAGNOSIS — F0392 Unspecified dementia, unspecified severity, with psychotic disturbance: Secondary | ICD-10-CM | POA: Diagnosis present

## 2022-02-04 DIAGNOSIS — Z006 Encounter for examination for normal comparison and control in clinical research program: Secondary | ICD-10-CM | POA: Diagnosis not present

## 2022-02-04 DIAGNOSIS — R001 Bradycardia, unspecified: Secondary | ICD-10-CM | POA: Diagnosis not present

## 2022-02-04 DIAGNOSIS — Z888 Allergy status to other drugs, medicaments and biological substances status: Secondary | ICD-10-CM | POA: Diagnosis not present

## 2022-02-04 DIAGNOSIS — I701 Atherosclerosis of renal artery: Secondary | ICD-10-CM | POA: Diagnosis present

## 2022-02-04 DIAGNOSIS — A0839 Other viral enteritis: Secondary | ICD-10-CM | POA: Diagnosis present

## 2022-02-04 DIAGNOSIS — N179 Acute kidney failure, unspecified: Secondary | ICD-10-CM | POA: Diagnosis present

## 2022-02-04 DIAGNOSIS — L89152 Pressure ulcer of sacral region, stage 2: Secondary | ICD-10-CM | POA: Diagnosis present

## 2022-02-04 DIAGNOSIS — E1151 Type 2 diabetes mellitus with diabetic peripheral angiopathy without gangrene: Secondary | ICD-10-CM | POA: Diagnosis present

## 2022-02-04 DIAGNOSIS — N3000 Acute cystitis without hematuria: Secondary | ICD-10-CM | POA: Diagnosis present

## 2022-02-04 DIAGNOSIS — Z882 Allergy status to sulfonamides status: Secondary | ICD-10-CM | POA: Diagnosis not present

## 2022-02-04 DIAGNOSIS — R627 Adult failure to thrive: Secondary | ICD-10-CM | POA: Diagnosis present

## 2022-02-04 DIAGNOSIS — E860 Dehydration: Secondary | ICD-10-CM | POA: Diagnosis present

## 2022-02-04 DIAGNOSIS — E440 Moderate protein-calorie malnutrition: Secondary | ICD-10-CM | POA: Diagnosis present

## 2022-02-04 DIAGNOSIS — D631 Anemia in chronic kidney disease: Secondary | ICD-10-CM | POA: Diagnosis present

## 2022-02-04 DIAGNOSIS — E785 Hyperlipidemia, unspecified: Secondary | ICD-10-CM | POA: Diagnosis present

## 2022-02-04 DIAGNOSIS — Z7984 Long term (current) use of oral hypoglycemic drugs: Secondary | ICD-10-CM | POA: Diagnosis not present

## 2022-02-04 LAB — BASIC METABOLIC PANEL
Anion gap: 9 (ref 5–15)
BUN: 72 mg/dL — ABNORMAL HIGH (ref 8–23)
CO2: 20 mmol/L — ABNORMAL LOW (ref 22–32)
Calcium: 9.6 mg/dL (ref 8.9–10.3)
Chloride: 116 mmol/L — ABNORMAL HIGH (ref 98–111)
Creatinine, Ser: 1.78 mg/dL — ABNORMAL HIGH (ref 0.44–1.00)
GFR, Estimated: 27 mL/min — ABNORMAL LOW (ref >60)
Glucose, Bld: 106 mg/dL — ABNORMAL HIGH (ref 70–99)
Potassium: 4.2 mmol/L (ref 3.5–5.1)
Sodium: 145 mmol/L (ref 135–145)

## 2022-02-04 LAB — CBC
HCT: 35.1 % — ABNORMAL LOW (ref 36.0–46.0)
Hemoglobin: 10.8 g/dL — ABNORMAL LOW (ref 12.0–15.0)
MCH: 28.2 pg (ref 26.0–34.0)
MCHC: 30.8 g/dL (ref 30.0–36.0)
MCV: 91.6 fL (ref 80.0–100.0)
Platelets: 284 10*3/uL (ref 150–400)
RBC: 3.83 MIL/uL — ABNORMAL LOW (ref 3.87–5.11)
RDW: 13 % (ref 11.5–15.5)
WBC: 6.4 10*3/uL (ref 4.0–10.5)
nRBC: 0 % (ref 0.0–0.2)

## 2022-02-04 LAB — PROCALCITONIN: Procalcitonin: <0.1 ng/mL

## 2022-02-04 LAB — C-REACTIVE PROTEIN: CRP: 1.3 mg/dL — ABNORMAL HIGH (ref <1.0)

## 2022-02-04 LAB — TROPONIN I (HIGH SENSITIVITY)
Troponin I (High Sensitivity): 23 ng/L — ABNORMAL HIGH (ref <18)
Troponin I (High Sensitivity): 24 ng/L — ABNORMAL HIGH (ref <18)
Troponin I (High Sensitivity): 20 ng/L — ABNORMAL HIGH (ref <18)

## 2022-02-04 LAB — GLUCOSE, CAPILLARY
Glucose-Capillary: 106 mg/dL — ABNORMAL HIGH (ref 70–99)
Glucose-Capillary: 149 mg/dL — ABNORMAL HIGH (ref 70–99)
Glucose-Capillary: 160 mg/dL — ABNORMAL HIGH (ref 70–99)
Glucose-Capillary: 59 mg/dL — ABNORMAL LOW (ref 70–99)
Glucose-Capillary: 126 mg/dL — ABNORMAL HIGH (ref 70–99)

## 2022-02-04 LAB — FERRITIN: Ferritin: 376 ng/mL — ABNORMAL HIGH (ref 11–307)

## 2022-02-04 LAB — CREATININE, SERUM
Creatinine, Ser: 2.03 mg/dL — ABNORMAL HIGH (ref 0.44–1.00)
GFR, Estimated: 23 mL/min — ABNORMAL LOW (ref >60)

## 2022-02-04 LAB — MAGNESIUM: Magnesium: 2.5 mg/dL — ABNORMAL HIGH (ref 1.7–2.4)

## 2022-02-04 LAB — FIBRINOGEN: Fibrinogen: 437 mg/dL (ref 210–475)

## 2022-02-04 LAB — D-DIMER, QUANTITATIVE: D-Dimer, Quant: 1.01 ug/mL-FEU — ABNORMAL HIGH (ref 0.00–0.50)

## 2022-02-04 MED ORDER — SODIUM CHLORIDE 0.9 % IV SOLN
INTRAVENOUS | Status: DC
Start: 2022-02-04 — End: 2022-02-05

## 2022-02-04 MED ORDER — NIRMATRELVIR/RITONAVIR (PAXLOVID) TABLET (RENAL DOSING): 2.0000 | ORAL_TABLET | Freq: Two times a day (BID) | ORAL | Status: DC

## 2022-02-04 MED ORDER — MOLNUPIRAVIR EUA 200MG CAPSULE
4.0000 | ORAL_CAPSULE | Freq: Two times a day (BID) | ORAL | Status: AC
  Administered 2022-02-04 – 2022-02-08 (×9): 800 mg via ORAL
  Filled 2022-02-04: qty 4

## 2022-02-04 MED ORDER — SODIUM CHLORIDE 0.9 % IV SOLN: INTRAVENOUS | Status: DC

## 2022-02-04 MED ORDER — SODIUM CHLORIDE 0.9 % IV SOLN
2.0000 g | INTRAVENOUS | Status: DC
  Administered 2022-02-04: 2 g via INTRAVENOUS
  Filled 2022-02-04: qty 20

## 2022-02-04 MED ORDER — ZINC SULFATE 220 (50 ZN) MG PO CAPS
220.0000 mg | ORAL_CAPSULE | Freq: Every day | ORAL | Status: DC
  Administered 2022-02-04 – 2022-02-15 (×11): 220 mg via ORAL
  Filled 2022-02-04 (×11): qty 1

## 2022-02-04 MED ORDER — SODIUM CHLORIDE 0.9 % IV SOLN: 100.0000 mg | Freq: Every day | INTRAVENOUS | Status: DC

## 2022-02-04 MED ORDER — OXYCODONE HCL 5 MG PO TABS
5.0000 mg | ORAL_TABLET | Freq: Four times a day (QID) | ORAL | Status: DC | PRN
  Administered 2022-02-05 – 2022-02-12 (×4): 5 mg via ORAL
  Filled 2022-02-04 (×5): qty 1

## 2022-02-04 MED ORDER — VITAMIN C 500 MG PO TABS
500.0000 mg | ORAL_TABLET | Freq: Every day | ORAL | Status: DC
  Administered 2022-02-04 – 2022-02-11 (×8): 500 mg via ORAL
  Filled 2022-02-04 (×8): qty 1

## 2022-02-04 MED ORDER — HEPARIN SODIUM (PORCINE) 5000 UNIT/ML IJ SOLN
5000.0000 [IU] | Freq: Three times a day (TID) | INTRAMUSCULAR | Status: DC
  Administered 2022-02-04 – 2022-02-08 (×12): 5000 [IU] via SUBCUTANEOUS
  Filled 2022-02-04 (×12): qty 1

## 2022-02-04 MED ORDER — ONDANSETRON HCL 4 MG/2ML IJ SOLN: 4.0000 mg | Freq: Four times a day (QID) | INTRAMUSCULAR | Status: DC | PRN

## 2022-02-04 MED ORDER — INSULIN ASPART 100 UNIT/ML IJ SOLN
0.0000 [IU] | Freq: Three times a day (TID) | INTRAMUSCULAR | Status: DC
  Administered 2022-02-04: 1 [IU] via SUBCUTANEOUS
  Administered 2022-02-04: 2 [IU] via SUBCUTANEOUS
  Administered 2022-02-05 (×2): 1 [IU] via SUBCUTANEOUS
  Administered 2022-02-05: 2 [IU] via SUBCUTANEOUS
  Administered 2022-02-06: 1 [IU] via SUBCUTANEOUS
  Administered 2022-02-06: 2 [IU] via SUBCUTANEOUS
  Administered 2022-02-07 (×2): 1 [IU] via SUBCUTANEOUS
  Administered 2022-02-07: 3 [IU] via SUBCUTANEOUS
  Administered 2022-02-08: 2 [IU] via SUBCUTANEOUS
  Administered 2022-02-08: 1 [IU] via SUBCUTANEOUS
  Administered 2022-02-09: 2 [IU] via SUBCUTANEOUS
  Administered 2022-02-09 – 2022-02-10 (×4): 1 [IU] via SUBCUTANEOUS
  Administered 2022-02-11: 2 [IU] via SUBCUTANEOUS
  Administered 2022-02-11: 3 [IU] via SUBCUTANEOUS
  Administered 2022-02-11 – 2022-02-12 (×3): 1 [IU] via SUBCUTANEOUS
  Administered 2022-02-13: 2 [IU] via SUBCUTANEOUS
  Administered 2022-02-13 – 2022-02-14 (×2): 3 [IU] via SUBCUTANEOUS
  Administered 2022-02-14: 1 [IU] via SUBCUTANEOUS
  Administered 2022-02-14 – 2022-02-15 (×2): 2 [IU] via SUBCUTANEOUS
  Filled 2022-02-04 (×26): qty 1

## 2022-02-04 MED ORDER — ADULT MULTIVITAMIN W/MINERALS CH
1.0000 | ORAL_TABLET | Freq: Every day | ORAL | Status: DC
  Administered 2022-02-04 – 2022-02-15 (×11): 1 via ORAL
  Filled 2022-02-04 (×11): qty 1

## 2022-02-04 MED ORDER — ACETAMINOPHEN 325 MG PO TABS
650.0000 mg | ORAL_TABLET | Freq: Four times a day (QID) | ORAL | Status: DC | PRN
Start: 2022-02-04 — End: 2022-02-15
  Administered 2022-02-04 – 2022-02-14 (×4): 650 mg via ORAL
  Filled 2022-02-04 (×4): qty 2

## 2022-02-04 MED ORDER — ONDANSETRON HCL 4 MG PO TABS: 4.0000 mg | ORAL_TABLET | Freq: Four times a day (QID) | ORAL | Status: DC | PRN

## 2022-02-04 MED ORDER — SODIUM CHLORIDE 0.9 % IV SOLN
200.0000 mg | Freq: Once | INTRAVENOUS | Status: AC
  Administered 2022-02-04: 200 mg via INTRAVENOUS
  Filled 2022-02-04: qty 40

## 2022-02-04 NOTE — Progress Notes (Signed)
   02/04/22 0845  Provider Notification  Provider Name/Title Shelly Coss, MD  Date Provider Notified 02/04/22  Time Provider Notified 0845  Method of Notification  (secure chat)  Notification Reason Critical result (CCMD said pt in bradycardia sustaining HR of high 30s and low 40s for 1 hour. MD made aware. CCMD said pt wide QRS for 20 seconds. MD made aware, EKG ordered. CCMD called a second time, said 2.5 second pause. EKG complete, MD made aware of pause.)  Test performed and critical result  (EKG)  Date Critical Result Received 02/04/22  Time Critical Result Received 0845  Provider response See new orders  Date of Provider Response 02/04/22  Time of Provider Response [phone]

## 2022-02-04 NOTE — Consult Note (Signed)
CARDIOLOGY CONSULT NOTE               Patient ID: Martha Walker MRN: [id number] DOB/AGE: 09-13-1931 88 y.o.  Admit date: 02/03/2022 Referring Physician Dr Shelly Coss hospitalist Primary Physician Dr Adrian Prows primary Primary Cardiologist  Reason for Consultation bradycardia COVID 19  HPI: Patient is a 88 year old with COVID-19 multiple medical problems including chronic renal sufficiency diabetes hyperlipidemia hypertension peripheral vascular disease bilateral carotic stenosis and bradycardia presented with persistent worsening weakness fatigue decreased appetite.  Patient was brought in for evaluation complaint no vague chest pain but also significant confusion.  Review of systems complete and found to be negative unless listed above     Past Medical History:  Diagnosis Date   Ankle edema, bilateral    Atrophic vaginitis    B12 deficiency    Bilateral carotid artery stenosis    Chronic anemia    CKD stage 3 due to type 2 diabetes mellitus (HCC)    Dementia (HCC)    Diabetes mellitus without complication ([place])    Diverticulitis    Hyperlipidemia    Hypertension    Osteoporosis    PVD (peripheral vascular disease) ([place])    Vitamin D deficiency     Past Surgical History:  Procedure Laterality Date   CATARACT EXTRACTION W/PHACO Right 02/21/2021   Procedure: CATARACT EXTRACTION PHACO AND INTRAOCULAR LENS PLACEMENT ([place]) RIGHT KAHOOK DUAL BLADE GONIOTOMY;  Surgeon: Leandrew Koyanagi, MD;  Location: [facility];  Service: Ophthalmology;  Laterality: Right;  12.45 01:20.1    Medications Prior to Admission  Medication Sig Dispense Refill Last Dose   acetaminophen (TYLENOL) 325 MG tablet Take by mouth.   prn at prn   ammonium lactate (AMLACTIN) 12 % cream Apply 1 Application topically as directed.   unknown at unknown   brimonidine (ALPHAGAN) 0.2 % ophthalmic solution Place 1 drop into both eyes 2 (two) times daily.   unknown at unknown   cetirizine  (ZYRTEC) 10 MG tablet Take 10 mg by mouth daily.   unknown at unknown   Cholecalciferol 1.25 MG (50000 UT) capsule Take 50,000 Units by mouth every 7 (seven) days.   unknown at unknown   diclofenac Sodium (VOLTAREN) 1 % GEL Apply 2 g topically 4 (four) times daily as needed.   prn at prn   docusate sodium (COLACE) 100 MG capsule Take 100 mg by mouth daily.   unknown at unknown   dorzolamide-timolol (COSOPT) 22.3-6.8 MG/ML ophthalmic solution 1 drop 2 (two) times daily.   unknown at unknown   FARXIGA 10 MG TABS tablet Take 10 mg by mouth daily.   unknown at unknown   GAVILAX 17 GM/SCOOP powder Take by mouth.   unknown at unknown   hydrALAZINE (APRESOLINE) 25 MG tablet Take 25 mg by mouth 2 (two) times daily.   unknown at unknown   ketoconazole (NIZORAL) 2 % cream Apply 1 Application topically as directed. Apply as directed once or twice daily.   unknown at unknown   ketoconazole (NIZORAL) 2 % shampoo Massage into scalp and let sit 10 minutes before washing out twice weekly. 120 mL 2 unknown at unknown   latanoprost (XALATAN) 0.005 % ophthalmic solution Place 1 drop into both eyes at bedtime.   unknown at unknown   Latanoprostene Bunod (VYZULTA) 0.024 % SOLN Apply to eye.   unknown at unknown   loperamide (IMODIUM) 2 MG capsule Take 2 mg by mouth as needed for diarrhea or loose stools.   prn at prn  magnesium hydroxide (MILK OF MAGNESIA) 400 MG/5ML suspension Take 30 mLs by mouth daily as needed for mild constipation.   prn at prn   memantine (NAMENDA) 5 MG tablet Take 5 mg by mouth 2 (two) times daily.   unknown at unknown   metFORMIN (GLUCOPHAGE) 500 MG tablet Take 500 mg by mouth 2 (two) times daily with a meal.   unknown at unknown   mirabegron ER (MYRBETRIQ) 50 MG TB24 tablet Take 1 tablet (50 mg total) by mouth daily. 30 tablet 11 unknown at unknown   rosuvastatin (CRESTOR) 10 MG tablet Take 10 mg by mouth daily.   unknown at unknown   SENNA-TABS 8.6 MG tablet Take by mouth daily as needed.    prn at prn   triamcinolone ointment (KENALOG) 0.1 % Apply to affected areas twice daily as needed for itchy rash at back. Avoid applying to face, groin, and axilla. Use as directed. Risk of skin atrophy with long-term use reviewed. 80 g 2 prn at prn   valsartan (DIOVAN) 80 MG tablet Take 80 mg by mouth daily.   unknown at unknown   vitamin B-12 (CYANOCOBALAMIN) 1000 MCG tablet Take 1,000 mcg by mouth daily.   unknown at unknown   alum & mag hydroxide-simeth (MAALOX/MYLANTA) 200-200-20 MG/5ML suspension Take 30 mLs by mouth every 6 (six) hours as needed for indigestion or heartburn.   prn at prn   Blood Glucose Monitoring Suppl (FIFTY50 GLUCOSE METER 2.0) w/Device KIT Use as directed. DX:E11.9      conjugated estrogens (PREMARIN) vaginal cream Place 1 Applicatorful vaginally as directed. Twice weekly (Patient not taking: Reported on 02/04/2022)   Not Taking   hydrocortisone 2.5 % cream Apply topically 2 (two) times daily as needed (Rash). (Patient not taking: Reported on 02/04/2022) 30 g 2 Not Taking   mometasone (ELOCON) 0.1 % lotion Apply daily as needed to scalp for itch or scale. Avoid applying to face, groin, and axilla. (Patient not taking: Reported on 02/04/2022) 60 mL 4 Not Taking   Netarsudil Dimesylate (RHOPRESSA) 0.02 % SOLN Place 1 drop into the right eye at bedtime. (Patient not taking: Reported on 02/04/2022)   Not Taking   oxyCODONE-acetaminophen (PERCOCET/ROXICET) 5-325 MG tablet Take 1 tablet by mouth every 6 (six) hours as needed for severe pain. (Patient not taking: Reported on 02/04/2022) 8 tablet 0 Not Taking   Social History   Socioeconomic History   Marital status: Widowed    Spouse name: Not on file   Number of children: Not on file   Years of education: Not on file   Highest education level: Not on file  Occupational History   Not on file  Tobacco Use   Smoking status: Never   Smokeless tobacco: Never  Vaping Use   Vaping Use: Never used  Substance and Sexual Activity    Alcohol use: Never   Drug use: Never   Sexual activity: Not on file  Other Topics Concern   Not on file  Social History Narrative   Lives at assisted living facility- [facility].   Social Determinants of Health   Financial Resource Strain: Not on file  Food Insecurity: Not on file  Transportation Needs: Not on file  Physical Activity: Not on file  Stress: Not on file  Social Connections: Not on file  Intimate Partner Violence: Not on file    No family history on file.    Review of systems complete and found to be negative unless listed above  PHYSICAL EXAM  General: Well developed, well nourished, in no acute distress HEENT:  Normocephalic and atramatic Neck:  No JVD.  Lungs: Clear bilaterally to auscultation and percussion. Heart: Bradycardia . Normal S1 and S2 without gallops or murmurs.  Abdomen: Bowel sounds are positive, abdomen soft and non-tender  Msk:  Back normal, normal gait. Normal strength and tone for age. Extremities: No clubbing, cyanosis or edema.   Neuro: Alert and oriented X 3. Psych:  Good affect, responds appropriately  Labs:   Lab Results  Component Value Date   WBC 6.4 02/04/2022   HGB 10.8 (L) 02/04/2022   HCT 35.1 (L) 02/04/2022   MCV 91.6 02/04/2022   PLT 284 02/04/2022    Recent Labs  Lab 02/03/22 2118 02/04/22 0237 02/04/22 1022  NA 143  --  145  K 4.7  --  4.2  CL 105  --  116*  CO2 21*  --  20*  BUN 76*  --  72*  CREATININE 2.15*   < > 1.78*  CALCIUM 10.1  --  9.6  PROT 8.2*  --   --   BILITOT 1.1  --   --   ALKPHOS 52  --   --   ALT 11  --   --   AST 26  --   --   GLUCOSE 153*  --  106*   < > = values in this interval not displayed.   No results found for: "CKTOTAL", "CKMB", "CKMBINDEX", "TROPONINI" No results found for: "CHOL" No results found for: "HDL" No results found for: "LDLCALC" No results found for: "TRIG" No results found for: "CHOLHDL" No results found for: "LDLDIRECT"    Radiology: CT ABDOMEN  PELVIS WO CONTRAST  Result Date: 02/03/2022 CLINICAL DATA:  Abdominal pain, acute, nonlocalized EXAM: CT ABDOMEN AND PELVIS WITHOUT CONTRAST TECHNIQUE: Multidetector CT imaging of the abdomen and pelvis was performed following the standard protocol without IV contrast. RADIATION DOSE REDUCTION: This exam was performed according to the departmental dose-optimization program which includes automated exposure control, adjustment of the mA and/or kV according to patient size and/or use of iterative reconstruction technique. COMPARISON:  None Available. FINDINGS: Lower chest: Moderate right coronary artery calcification. Global cardiac size within normal limits. No acute abnormality. Hepatobiliary: Cholelithiasis without pericholecystic inflammatory change. Liver unremarkable on this noncontrast examination. No intra or extrahepatic biliary ductal dilation. Pancreas: Unremarkable Spleen: Unremarkable Adrenals/Urinary Tract: Adrenal glands are unremarkable. Kidneys are normal, without renal calculi, focal lesion, or hydronephrosis. Bladder is unremarkable. Stomach/Bowel: Severe distal colonic diverticulosis involving the distal transverse, descending, and sigmoid colon. No superimposed acute inflammatory change. Stomach, small bowel, and large bowel are unremarkable. Appendix normal. No free intraperitoneal gas or fluid. Vascular/Lymphatic: Extensive aortoiliac atherosclerotic calcification. No aortic aneurysm. Particularly prominent atherosclerotic calcification involving the mesenteric and renal artery origins, however, the degree of stenosis is not well assessed on this noncontrast examination. No pathologic adenopathy within the abdomen and pelvis. Reproductive: Status post hysterectomy. No adnexal masses. Other: No abdominal wall hernia.  Rectum unremarkable. Musculoskeletal: Remote inferior endplate fracture K34 with no significant loss of height. No acute bone abnormality. Degenerative changes are seen within the  lumbar spine. No lytic or blastic bone lesion. IMPRESSION: 1. No acute intra-abdominal pathology identified. No definite radiographic explanation for the patient's reported symptoms. 2. Moderate right coronary artery calcification. 3. Cholelithiasis. 4. Severe distal colonic diverticulosis without superimposed acute inflammatory change. 5. Peripheral vascular disease. Suspected hemodynamically significant mesenteric and renal artery stenoses. If there is clinical evidence  of chronic mesenteric ischemia or hemodynamically significant renal artery stenosis, CT arteriography may be more helpful for further evaluation. Aortic Atherosclerosis (ICD10-I70.0). Electronically Signed   By: Fidela Salisbury M.D.   On: 02/03/2022 23:26   CT HEAD WO CONTRAST (5MM)  Result Date: 02/03/2022 CLINICAL DATA:  Head trauma, minor (Age >= 65y). Altered mental status. EXAM: CT HEAD WITHOUT CONTRAST TECHNIQUE: Contiguous axial images were obtained from the base of the skull through the vertex without intravenous contrast. RADIATION DOSE REDUCTION: This exam was performed according to the departmental dose-optimization program which includes automated exposure control, adjustment of the mA and/or kV according to patient size and/or use of iterative reconstruction technique. COMPARISON:  None Available. FINDINGS: Brain: Normal anatomic configuration. Parenchymal volume loss is commensurate with the patient's age. Mild periventricular white matter changes are present likely reflecting the sequela of small vessel ischemia. Remote lacunar infarct within the right thalamus and left globus pallidus. No abnormal intra or extra-axial mass lesion or fluid collection. No abnormal mass effect or midline shift. No evidence of acute intracranial hemorrhage or infarct. Ventricular size is normal. Cerebellum unremarkable. Vascular: No asymmetric hyperdense vasculature at the skull base. Extensive atherosclerotic calcification within the carotid siphons.  Skull: Intact Sinuses/Orbits: Paranasal sinuses are clear. Orbits are unremarkable. Other: Left mastoidectomy has been performed. Additionally, the auditory ossicles are absent suggesting prior resection. Debris or packing material is noted within the resection bed. Right mastoid air cells and middle ear cavity is clear. IMPRESSION: 1. No acute intracranial abnormality. 2. Mild senescent change and remote lacunar infarcts. 3. Postsurgical changes involving the left mastoid air cells and middle ear cavity. Debris versus residual packing material within the a resection bed. Electronically Signed   By: Fidela Salisbury M.D.   On: 02/03/2022 23:17   DG Chest Portable 1 View  Result Date: 02/03/2022 CLINICAL DATA:  Sob.  Recent covid. Cough, sob, nausea, vomiting. EXAM: PORTABLE CHEST 1 VIEW COMPARISON:  Chest x-ray 06/10/2021 FINDINGS: The heart and mediastinal contours are unchanged. Aortic calcification. No focal consolidation. No pulmonary edema. No pleural effusion. No pneumothorax. No acute osseous abnormality. Right shoulder rotator cuff anchor suture. IMPRESSION: 1. No active disease. 2.  Aortic Atherosclerosis (ICD10-I70.0). Electronically Signed   By: Iven Finn M.D.   On: 02/03/2022 22:45    EKG: Sinus bradycardia rate of around 45 nonspecific ST-T wave changes  ASSESSMENT AND PLAN:  Sinus bradycardia COVID infection Hypoxemia Urinary tract infection Metabolic encephalopathy Acute on chronic renal insufficiency Hypertension Hyperlipidemia  Peripheral vascular disease Diabetes . Plan Continue medical therapy for COVID With supportive respiratory care supplemental oxygen Paxlovid Agree with antibiotic therapy for urinary tract infection Metabolic encephalopathy partly related to COVID-19 Significant sinus bradycardia no clear location for permanent pace maker recommend conservative medical therapy Continue diabetes management with metformin Agree with statin therapy for  hyperlipidemia At this point recommend conservative cardiac input Hypertension management and control   Signed: Yolonda Kida MD 02/04/2022, 9:53 PM

## 2022-02-04 NOTE — Progress Notes (Addendum)
Brief same day note:  Patient is a 88 year old female with history of carotid stenosis, CKD stage IIIa, diabetes type 2, hyperlipidemia, hypertension, peripheral vascular disease who presented from ALF with complaint of progressive weakness, diabetes Type, nausea, vomiting, diarrhea, confusion.  On presentation she was hemodynamically stable, on room air.  Lab work showed creatinine of 2.15, baseline creatinine around 1.3.  COVID screen test positive.  UA showed significant leukocytes, UTI suspected, started on ceftriaxone, urine culture sent.  Hospital course remarkable for severe bradycardia.  Cardiology also consulted. Patient seen and examined at the bedside this morning.  She is hemodynamically stable, blood pressure stable.  Continues to have diarrhea.  Noticed to have heart rate fluctuating between 30-60.  Symptomatic.  Patient mildly confused  Assessment and plan:  COVID infection: Presented with nausea, vomiting, diarrhea, weakness.  Likely COVID gastroenteritis.  Continue supportive care, fluid.  Started on Paxlovid.  UTI: UA was history of UTI with some few leukocytes.  Urine culture sent.  Start on ceftriaxone.  Bradycardia: She is mostly symptomatic.  Patient heart rate runs low to 30.  Heart rate fluctuating between 30-60.  There was also report of significant pauses of about 2 seconds.  Reviewed her previous records, she was seen by Dr. 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  I have reached out to cardiology( Dr Clayborn Bigness) here,cardiology will see him here.  Monitor on telemetry.  Avoid AV nodal blocker agents.  We will check electrolytes  Confusion: Likely from metabolic encephalopathy from UTI, COVID.  Continue supportive care, frequent reorientation  AKI in CKD stage IIIa: Baseline creatinine 1.3.  Presented with creatinine in the range of 2.  Continue gentle IV fluids.  Likely from dehydration  Bilateral carotid stenosis: On Plavix, statin  Diabetes type  2: On metformin at home.  Continue sliding scale insulin  Hyperlipidemia: On statin at home  Hypertension: Currently blood pressure stable.  ARB on hold due to AKI  Peripheral vascular disease: CT abdomen/pelvis showed hemodynamically significant mesenteric and renal artery stenoses.  Further work-up as an outpatient, cannot do contrast studies like CT angiogram due  her kidney function.

## 2022-02-04 NOTE — H&P (Addendum)
History and Physical    Martha Walker:[id number] DOB: 01-25-1932 DOA: 02/03/2022  PCP: Leonel Ramsay, MD  Patient coming from: ALF  I have personally briefly reviewed patient's old medical records in [place]  Chief Complaint: progressive weakness/failure to thrive/dysuria x 1 week  HPI: Martha Walker is a 88 y.o. female with medical history significant of  B/l carotid a stenosis, CKDIII, DMII, HLD, HTN,PVD who presents from ALF with complaints of progressive weakness, decrease appetite, n/v/d, confusion x 1 week ago. Per daughter who give history, patient has no complaint of chest pain / sob/ but has had n/v/d and fever.  ED Course:  In ED  Vitals:afeb, bp 118/56, hr 66, rr 16 sat 97% on ra   EKG:  Irregular rhythm RBBB CE 26 ,23  Labs Wbc:7.6, hgb 11.5, plt 301   Na: 143, K 4.7, glu 153, cr 2.15 ( 1.30) Bicarb 21 , ag 17 CE 26  Covid: +  UA: + >50 wbc,  Tx CTX, ns 500cc  CTH: 1. No acute intracranial abnormality. 2. Mild senescent change and remote lacunar infarcts. 3. Postsurgical changes involving the left mastoid air cells and middle ear cavity. Debris versus residual packing material within the a resection bed.  CTAB/Pelvis: IMPRESSION: 1. No acute intra-abdominal pathology identified. No definite radiographic explanation for the patient's reported symptoms. 2. Moderate right coronary artery calcification. 3. Cholelithiasis. 4. Severe distal colonic diverticulosis without superimposed acute inflammatory change. 5. Peripheral vascular disease. Suspected hemodynamically significant mesenteric and renal artery stenoses. If there is clinical evidence of chronic mesenteric ischemia or hemodynamically significant renal artery stenosis, CT arteriography may be more helpful for further evaluation.   Review of Systems: As per HPI otherwise 10 point review of systems negative.   Past Medical History:  Diagnosis Date   Ankle edema, bilateral     Atrophic vaginitis    B12 deficiency    Bilateral carotid artery stenosis    Chronic anemia    CKD stage 3 due to type 2 diabetes mellitus (HCC)    Dementia (HCC)    Diabetes mellitus without complication ([place])    Diverticulitis    Hyperlipidemia    Hypertension    Osteoporosis    PVD (peripheral vascular disease) ([place])    Vitamin D deficiency     Past Surgical History:  Procedure Laterality Date   CATARACT EXTRACTION W/PHACO Right 02/21/2021   Procedure: CATARACT EXTRACTION PHACO AND INTRAOCULAR LENS PLACEMENT (Micro) RIGHT KAHOOK DUAL BLADE GONIOTOMY;  Surgeon: Leandrew Koyanagi, MD;  Location: [facility];  Service: Ophthalmology;  Laterality: Right;  12.45 01:20.1     reports that she has never smoked. She has never used smokeless tobacco. She reports that she does not drink alcohol and does not use drugs.  Allergies  Allergen Reactions   Amlodipine Cough   Amlodipine Besy-Benazepril Hcl     Other reaction(s): Unknown   Atorvastatin Nausea And Vomiting    Onset 02/08/2011.   Augmentin [Amoxicillin-Pot Clavulanate]    Bactrim [Sulfamethoxazole-Trimethoprim]    Etodolac    Fosinopril    Ibudone [Hydrocodone-Ibuprofen]    Januvia [Sitagliptin]    Lipitor [Atorvastatin Calcium]    Metronidazole    Mobic [Meloxicam]    Penicillins    Pravastatin    Simvastatin    Vicodin [Hydrocodone-Acetaminophen]    Tylenol [Acetaminophen] Rash    No family history on file.  Prior to Admission medications   Medication Sig Start Date End Date Taking? Authorizing Provider  acetaminophen (  TYLENOL) 325 MG tablet Take by mouth.    [provider]  alum & mag hydroxide-simeth (MAALOX/MYLANTA) 200-200-20 MG/5ML suspension Take 30 mLs by mouth every 6 (six) hours as needed for indigestion or heartburn.    [provider]  Blood Glucose Monitoring Suppl (FIFTY50 GLUCOSE METER 2.0) w/Device KIT Use as directed. DX:E11.9 03/14/14   [provider]   brimonidine (ALPHAGAN) 0.2 % ophthalmic solution Place 1 drop into both eyes 2 (two) times daily.    [provider]  cetirizine (ZYRTEC) 10 MG tablet Take 10 mg by mouth daily.    [provider]  Cholecalciferol 1.25 MG (50000 UT) capsule Take by mouth.    [provider]  conjugated estrogens (PREMARIN) vaginal cream Place 1 Applicatorful vaginally as directed. Twice weekly    [provider]  diclofenac Sodium (VOLTAREN) 1 % GEL Apply 2 g topically 4 (four) times daily as needed.    [provider]  docusate sodium (COLACE) 100 MG capsule Take 100 mg by mouth daily.    [provider]  dorzolamide-timolol (COSOPT) 22.3-6.8 MG/ML ophthalmic solution 1 drop 2 (two) times daily. 02/04/20   [provider]  GAVILAX 17 GM/SCOOP powder Take by mouth. 11/16/20   [provider]  hydrALAZINE (APRESOLINE) 50 MG tablet Take 50 mg by mouth 2 (two) times daily.    [provider]  hydrocortisone 2.5 % cream Apply topically 2 (two) times daily as needed (Rash). 05/11/20   Moye, [place], MD  ketoconazole (NIZORAL) 2 % shampoo Massage into scalp and let sit 10 minutes before washing out twice weekly. 05/11/20   Moye, [place], MD  latanoprost (XALATAN) 0.005 % ophthalmic solution Place 1 drop into both eyes at bedtime.    [provider]  Latanoprostene Bunod (VYZULTA) 0.024 % SOLN Apply to eye.    [provider]  loperamide (IMODIUM) 2 MG capsule Take 2 mg by mouth as needed for diarrhea or loose stools.    [provider]  magnesium hydroxide (MILK OF MAGNESIA) 400 MG/5ML suspension Take 30 mLs by mouth daily as needed for mild constipation.    [provider]  metFORMIN (GLUCOPHAGE) 500 MG tablet Take 500 mg by mouth daily with breakfast.    [provider]  mirabegron ER (MYRBETRIQ) 50 MG TB24 tablet Take 1 tablet (50 mg total) by mouth daily. 07/09/21   MacDiarmid, Nicki Reaper, MD   mometasone (ELOCON) 0.1 % lotion Apply daily as needed to scalp for itch or scale. Avoid applying to face, groin, and axilla. 02/18/20   Moye, [place], MD  Netarsudil Dimesylate (RHOPRESSA) 0.02 % SOLN Place 1 drop into the right eye at bedtime.    [provider]  oxyCODONE-acetaminophen (PERCOCET/ROXICET) 5-325 MG tablet Take 1 tablet by mouth every 6 (six) hours as needed for severe pain. 06/10/21   Cuthriell, Charline Bills, PA-C  rosuvastatin (CRESTOR) 5 MG tablet Take 5 mg by mouth daily.    [provider]  SENNA-TABS 8.6 MG tablet Take by mouth daily as needed. 11/21/20   [provider]  triamcinolone ointment (KENALOG) 0.1 % Apply to affected areas twice daily as needed for itchy rash at back. Avoid applying to face, groin, and axilla. Use as directed. Risk of skin atrophy with long-term use reviewed. 02/17/20   Moye, [place], MD  valsartan (DIOVAN) 80 MG tablet Take 80 mg by mouth daily.    [provider]  vitamin B-12 (CYANOCOBALAMIN) 1000 MCG tablet Take 1,000 mcg by mouth  daily.    [provider]  VITAMIN D PO Take 1,250 Units by mouth daily.    [provider]    Physical Exam: Vitals:   02/03/22 2103 02/03/22 2105 02/03/22 2200  BP: (!) 118/56  126/60  Pulse: 66  67  Resp: 16  15  Temp: 98.4 F (36.9 C)    TempSrc: Oral    SpO2: 97%  96%  Weight:  77.1 kg      Vitals:   02/03/22 2103 02/03/22 2105 02/03/22 2200  BP: (!) 118/56  126/60  Pulse: 66  67  Resp: 16  15  Temp: 98.4 F (36.9 C)    TempSrc: Oral    SpO2: 97%  96%  Weight:  77.1 kg   Constitutional: NAD, calm, comfortable Eyes: PERRL, lids and conjunctivae normal ENMT: Mucous membranes are moist. Posterior pharynx clear of any exudate or lesions.Normal dentition.  Neck: normal, supple, no masses, no thyromegaly Respiratory: clear to auscultation bilaterally, no wheezing, no crackles. Normal respiratory effort. No accessory muscle use.  Cardiovascular:  Regular rate and rhythm, no murmurs / rubs / gallops. No extremity edema. 2+ pedal pulses. No carotid bruits.  Abdomen: no tenderness, no masses palpated. No hepatosplenomegaly. Bowel sounds positive.  Musculoskeletal: no clubbing / cyanosis. No joint deformity upper and lower extremities. Good ROM, no contractures. Normal muscle tone.  Skin: no rashes, lesions, ulcers. No induration Neurologic: CN 2-12 grossly intact. Sensation intact, l. Strength 3+/5 in all lower ext.  4+/5 in upper extremity  Psychiatric: alert to self and place, calm  Labs on Admission: I have personally reviewed following labs and imaging studies  CBC: Recent Labs  Lab 02/03/22 2118  WBC 7.6  HGB 11.5*  HCT 36.5  MCV 90.6  PLT 168   Basic Metabolic Panel: Recent Labs  Lab 02/03/22 2118  NA 143  K 4.7  CL 105  CO2 21*  GLUCOSE 153*  BUN 76*  CREATININE 2.15*  CALCIUM 10.1   GFR: CrCl cannot be calculated (Unknown ideal weight.). Liver Function Tests: Recent Labs  Lab 02/03/22 2118  AST 26  ALT 11  ALKPHOS 52  BILITOT 1.1  PROT 8.2*  ALBUMIN 4.2   No results for input(s): "LIPASE", "AMYLASE" in the last 168 hours. No results for input(s): "AMMONIA" in the last 168 hours. Coagulation Profile: No results for input(s): "INR", "PROTIME" in the last 168 hours. Cardiac Enzymes: No results for input(s): "CKTOTAL", "CKMB", "CKMBINDEX", "TROPONINI" in the last 168 hours. BNP (last 3 results) No results for input(s): "PROBNP" in the last 8760 hours. HbA1C: No results for input(s): "HGBA1C" in the last 72 hours. CBG: No results for input(s): "GLUCAP" in the last 168 hours. Lipid Profile: No results for input(s): "CHOL", "HDL", "LDLCALC", "TRIG", "CHOLHDL", "LDLDIRECT" in the last 72 hours. Thyroid Function Tests: No results for input(s): "TSH", "T4TOTAL", "FREET4", "T3FREE", "THYROIDAB" in the last 72 hours. Anemia Panel: No results for input(s): "VITAMINB12", "FOLATE", "FERRITIN", "TIBC",  "IRON", "RETICCTPCT" in the last 72 hours. Urine analysis:    Component Value Date/Time   COLORURINE YELLOW (A) 02/03/2022 2124   APPEARANCEUR CLOUDY (A) 02/03/2022 2124   APPEARANCEUR Cloudy (A) 01/10/2020 1510   LABSPEC 1.017 02/03/2022 2124   PHURINE 5.0 02/03/2022 2124   GLUCOSEU >=500 (A) 02/03/2022 2124   HGBUR NEGATIVE 02/03/2022 2124   BILIRUBINUR NEGATIVE 02/03/2022 2124   BILIRUBINUR Negative 01/10/2020 1510   KETONESUR 5 (A) 02/03/2022 2124   PROTEINUR 100 (A) 02/03/2022 2124   NITRITE NEGATIVE 02/03/2022  2124   LEUKOCYTESUR MODERATE (A) 02/03/2022 2124    Radiological Exams on Admission: CT ABDOMEN PELVIS WO CONTRAST  Result Date: 02/03/2022 CLINICAL DATA:  Abdominal pain, acute, nonlocalized EXAM: CT ABDOMEN AND PELVIS WITHOUT CONTRAST TECHNIQUE: Multidetector CT imaging of the abdomen and pelvis was performed following the standard protocol without IV contrast. RADIATION DOSE REDUCTION: This exam was performed according to the departmental dose-optimization program which includes automated exposure control, adjustment of the mA and/or kV according to patient size and/or use of iterative reconstruction technique. COMPARISON:  None Available. FINDINGS: Lower chest: Moderate right coronary artery calcification. Global cardiac size within normal limits. No acute abnormality. Hepatobiliary: Cholelithiasis without pericholecystic inflammatory change. Liver unremarkable on this noncontrast examination. No intra or extrahepatic biliary ductal dilation. Pancreas: Unremarkable Spleen: Unremarkable Adrenals/Urinary Tract: Adrenal glands are unremarkable. Kidneys are normal, without renal calculi, focal lesion, or hydronephrosis. Bladder is unremarkable. Stomach/Bowel: Severe distal colonic diverticulosis involving the distal transverse, descending, and sigmoid colon. No superimposed acute inflammatory change. Stomach, small bowel, and large bowel are unremarkable. Appendix normal. No free  intraperitoneal gas or fluid. Vascular/Lymphatic: Extensive aortoiliac atherosclerotic calcification. No aortic aneurysm. Particularly prominent atherosclerotic calcification involving the mesenteric and renal artery origins, however, the degree of stenosis is not well assessed on this noncontrast examination. No pathologic adenopathy within the abdomen and pelvis. Reproductive: Status post hysterectomy. No adnexal masses. Other: No abdominal wall hernia.  Rectum unremarkable. Musculoskeletal: Remote inferior endplate fracture T55 with no significant loss of height. No acute bone abnormality. Degenerative changes are seen within the lumbar spine. No lytic or blastic bone lesion. IMPRESSION: 1. No acute intra-abdominal pathology identified. No definite radiographic explanation for the patient's reported symptoms. 2. Moderate right coronary artery calcification. 3. Cholelithiasis. 4. Severe distal colonic diverticulosis without superimposed acute inflammatory change. 5. Peripheral vascular disease. Suspected hemodynamically significant mesenteric and renal artery stenoses. If there is clinical evidence of chronic mesenteric ischemia or hemodynamically significant renal artery stenosis, CT arteriography may be more helpful for further evaluation. Aortic Atherosclerosis (ICD10-I70.0). Electronically Signed   By: Fidela Salisbury M.D.   On: 02/03/2022 23:26   CT HEAD WO CONTRAST (5MM)  Result Date: 02/03/2022 CLINICAL DATA:  Head trauma, minor (Age >= 65y). Altered mental status. EXAM: CT HEAD WITHOUT CONTRAST TECHNIQUE: Contiguous axial images were obtained from the base of the skull through the vertex without intravenous contrast. RADIATION DOSE REDUCTION: This exam was performed according to the departmental dose-optimization program which includes automated exposure control, adjustment of the mA and/or kV according to patient size and/or use of iterative reconstruction technique. COMPARISON:  None Available.  FINDINGS: Brain: Normal anatomic configuration. Parenchymal volume loss is commensurate with the patient's age. Mild periventricular white matter changes are present likely reflecting the sequela of small vessel ischemia. Remote lacunar infarct within the right thalamus and left globus pallidus. No abnormal intra or extra-axial mass lesion or fluid collection. No abnormal mass effect or midline shift. No evidence of acute intracranial hemorrhage or infarct. Ventricular size is normal. Cerebellum unremarkable. Vascular: No asymmetric hyperdense vasculature at the skull base. Extensive atherosclerotic calcification within the carotid siphons. Skull: Intact Sinuses/Orbits: Paranasal sinuses are clear. Orbits are unremarkable. Other: Left mastoidectomy has been performed. Additionally, the auditory ossicles are absent suggesting prior resection. Debris or packing material is noted within the resection bed. Right mastoid air cells and middle ear cavity is clear. IMPRESSION: 1. No acute intracranial abnormality. 2. Mild senescent change and remote lacunar infarcts. 3. Postsurgical changes involving the left mastoid air cells  and middle ear cavity. Debris versus residual packing material within the a resection bed. Electronically Signed   By: Fidela Salisbury M.D.   On: 02/03/2022 23:17   DG Chest Portable 1 View  Result Date: 02/03/2022 CLINICAL DATA:  Sob.  Recent covid. Cough, sob, nausea, vomiting. EXAM: PORTABLE CHEST 1 VIEW COMPARISON:  Chest x-ray 06/10/2021 FINDINGS: The heart and mediastinal contours are unchanged. Aortic calcification. No focal consolidation. No pulmonary edema. No pleural effusion. No pneumothorax. No acute osseous abnormality. Right shoulder rotator cuff anchor suture. IMPRESSION: 1. No active disease. 2.  Aortic Atherosclerosis (ICD10-I70.0). Electronically Signed   By: Iven Finn M.D.   On: 02/03/2022 22:45    EKG: Independently reviewed.see above  Assessment/Plan COVID -19  infection w/0 hypoxemia  COVID gastroenteritis -place on plaxovid -gentle ivfs  -monitor on continuous pulse ox -obtain disease severity labls   UTI -ctx -f/u on urine culture    Metabolic encephalopathy -due to infection  -tx underlying cause    AKI on CKDIII -hold nephrotoxic medications -ivfs    B/l carotid a stenosis -no active issues  -continue on plavix, statin    DMII -monitor on poc fs and iss  -hold metformin -check a1c   HLD - resume as able   HTN -hold arb due to aki   PVD -CT with ? Of   hemodynamically significant mesenteric and renal artery stenoses. -will need to f/u with CTA further evaluation once renal failure has improved  -continue plavix    DVT prophylaxis: heparin Code Status: Full Family Communication: none at bedside Disposition Plan: patient  expected to be admitted greater than 2 midnights  Consults called: n/a Admission status: progressive care   Clance Boll MD [facility]  If 7PM-7AM, please contact night-coverage [email or web address] Password [email or web address]  02/04/2022, 12:30 AM

## 2022-02-04 NOTE — TOC Initial Note (Signed)
Transition of Care [facility]) - Initial/Assessment Note    Patient Details  Name: Martha Walker MRN: [id number] Date of Birth: Jan 12, 1932  Transition of Care [facility]) CM/SW Contact:    Ninfa Meeker, RN Phone Number: 02/04/2022, 11:31 AM  Clinical Narrative:                 Transition of Care Screening Note.  Transition of Care Department [facility]) has reviewed patient and no TOC needs have been identified at this time. We will continue to monitor patient advancement through Interdisciplinary progressions. If new patient transition needs arise, please place a consult.       Patient Goals and CMS Choice        Expected Discharge Plan and Services                                                Prior Living Arrangements/Services                       Activities of Daily Living Home Assistive Devices/Equipment: Gilford Rile (specify type) ADL Screening (condition at time of admission) Patient's cognitive ability adequate to safely complete daily activities?: Yes Is the patient deaf or have difficulty hearing?: Yes Does the patient have difficulty seeing, even when wearing glasses/contacts?: No Does the patient have difficulty concentrating, remembering, or making decisions?: No Patient able to express need for assistance with ADLs?: Yes Does the patient have difficulty dressing or bathing?: No Independently performs ADLs?: Yes (appropriate for developmental age) Does the patient have difficulty walking or climbing stairs?: Yes Weakness of Legs: Both Weakness of Arms/Hands: None  Permission Sought/Granted                  Emotional Assessment              Admission diagnosis:  Acute cystitis without hematuria [N30.00] AKI (acute kidney injury) ([place]) [N17.9] Altered mental status, unspecified altered mental status type [R41.82] COVID-19 [U07.1] Patient Active Problem List   Diagnosis Date Noted   COVID-19 02/04/2022   Bradycardia 05/10/2019   Chronic  anemia    Chest pain 05/09/2019   PCP:  Leonel Ramsay, MD Pharmacy:   [facility], [place] [address] Phone: [phone] Fax: [phone]  [facility] [id number] [place], [place] [facility] AT [address] [place] [address] Phone: [phone] Fax: [phone]     Social Determinants of Health (SDOH) Interventions    Readmission Risk Interventions     No data to display

## 2022-02-05 DIAGNOSIS — U071 COVID-19: Secondary | ICD-10-CM | POA: Diagnosis not present

## 2022-02-05 LAB — COMPREHENSIVE METABOLIC PANEL
ALT: 9 U/L (ref 0–44)
AST: 25 U/L (ref 15–41)
Albumin: 3.5 g/dL (ref 3.5–5.0)
Alkaline Phosphatase: 51 U/L (ref 38–126)
Anion gap: 7 (ref 5–15)
BUN: 45 mg/dL — ABNORMAL HIGH (ref 8–23)
CO2: 21 mmol/L — ABNORMAL LOW (ref 22–32)
Calcium: 9.6 mg/dL (ref 8.9–10.3)
Chloride: 115 mmol/L — ABNORMAL HIGH (ref 98–111)
Creatinine, Ser: 1.15 mg/dL — ABNORMAL HIGH (ref 0.44–1.00)
GFR, Estimated: 46 mL/min — ABNORMAL LOW (ref >60)
Glucose, Bld: 132 mg/dL — ABNORMAL HIGH (ref 70–99)
Potassium: 3.8 mmol/L (ref 3.5–5.1)
Sodium: 143 mmol/L (ref 135–145)
Total Bilirubin: 0.6 mg/dL (ref 0.3–1.2)
Total Protein: 6.8 g/dL (ref 6.5–8.1)

## 2022-02-05 LAB — GLUCOSE, CAPILLARY
Glucose-Capillary: 143 mg/dL — ABNORMAL HIGH (ref 70–99)
Glucose-Capillary: 133 mg/dL — ABNORMAL HIGH (ref 70–99)
Glucose-Capillary: 168 mg/dL — ABNORMAL HIGH (ref 70–99)
Glucose-Capillary: 145 mg/dL — ABNORMAL HIGH (ref 70–99)

## 2022-02-05 LAB — URINE CULTURE: Culture: >=100000 — AB

## 2022-02-05 LAB — CBC WITH DIFFERENTIAL/PLATELET
Abs Immature Granulocytes: 0.02 10*3/uL (ref 0.00–0.07)
Basophils Absolute: 0 10*3/uL (ref 0.0–0.1)
Basophils Relative: 0 %
Eosinophils Absolute: 0.1 10*3/uL (ref 0.0–0.5)
Eosinophils Relative: 2 %
HCT: 31.3 % — ABNORMAL LOW (ref 36.0–46.0)
Hemoglobin: 10 g/dL — ABNORMAL LOW (ref 12.0–15.0)
Immature Granulocytes: 0 %
Lymphocytes Relative: 24 %
Lymphs Abs: 1.3 10*3/uL (ref 0.7–4.0)
MCH: 28 pg (ref 26.0–34.0)
MCHC: 31.9 g/dL (ref 30.0–36.0)
MCV: 87.7 fL (ref 80.0–100.0)
Monocytes Absolute: 0.5 10*3/uL (ref 0.1–1.0)
Monocytes Relative: 9 %
Neutro Abs: 3.4 10*3/uL (ref 1.7–7.7)
Neutrophils Relative %: 65 %
Platelets: 255 10*3/uL (ref 150–400)
RBC: 3.57 MIL/uL — ABNORMAL LOW (ref 3.87–5.11)
RDW: 13.1 % (ref 11.5–15.5)
WBC: 5.3 10*3/uL (ref 4.0–10.5)
nRBC: 0 % (ref 0.0–0.2)

## 2022-02-05 LAB — C-REACTIVE PROTEIN: CRP: 0.6 mg/dL (ref <1.0)

## 2022-02-05 LAB — FERRITIN: Ferritin: 377 ng/mL — ABNORMAL HIGH (ref 11–307)

## 2022-02-05 LAB — D-DIMER, QUANTITATIVE: D-Dimer, Quant: 0.79 ug/mL-FEU — ABNORMAL HIGH (ref 0.00–0.50)

## 2022-02-05 LAB — MAGNESIUM: Magnesium: 2 mg/dL (ref 1.7–2.4)

## 2022-02-05 MED ORDER — SODIUM CHLORIDE 0.9 % IV SOLN
1.0000 g | INTRAVENOUS | Status: AC
  Administered 2022-02-05: 1 g via INTRAVENOUS
  Filled 2022-02-05: qty 1

## 2022-02-05 MED ORDER — MEMANTINE HCL 5 MG PO TABS
5.0000 mg | ORAL_TABLET | Freq: Two times a day (BID) | ORAL | Status: DC
  Administered 2022-02-05 – 2022-02-15 (×20): 5 mg via ORAL
  Filled 2022-02-05 (×22): qty 1

## 2022-02-05 MED ORDER — HYDRALAZINE HCL 50 MG PO TABS
25.0000 mg | ORAL_TABLET | Freq: Two times a day (BID) | ORAL | Status: DC
  Administered 2022-02-05 – 2022-02-07 (×5): 25 mg via ORAL
  Filled 2022-02-05 (×5): qty 1

## 2022-02-05 NOTE — Progress Notes (Signed)
[place] NOTE       Patient ID: Martha STOLARZ MRN: [id number] DOB/AGE: 86-Feb-1933 86 y.o.  Admit date: 02/03/2022 Referring Physician Dr. Tawanna Solo Primary Physician Dr. Ola Spurr  Primary Cardiologist Dr. Nehemiah Massed  Reason for Consultation bradycardia   HPI: Martha Walker is an 88yoF with a PMH of dementia, CKD 3, type 2 diabetes, hyperlipidemia, hypertension, carotid artery stenosis who presented to [facility] ED from her assisted living facility on 02/03/2022 with progressive weakness, N/V/D, and confusion above her baseline.  She was positive for COVID-19 on admission with a revealed significant leukocytes for which acute cystitis was suspected.  During her hospitalization she has been bradycardic with heart rate between 30s - 60s for which cardiology is consulted for further assistance.  Interval History: -no acute events, patient denies complaints -daughter at bedside, says she has been slowly improving  -daughter has not heard the patient complain about any dizziness or presyncope. Worked with PT and HR appropriately increased to the 100s -in sinus bradycardia with HR mostly in the mid to high 40s to 50s on tele   Review of systems limited by patient's baseline dementia  Past Medical History:  Diagnosis Date   Ankle edema, bilateral    Atrophic vaginitis    B12 deficiency    Bilateral carotid artery stenosis    Chronic anemia    CKD stage 3 due to type 2 diabetes mellitus (HCC)    Dementia (HCC)    Diabetes mellitus without complication ([place])    Diverticulitis    Hyperlipidemia    Hypertension    Osteoporosis    PVD (peripheral vascular disease) ([place])    Vitamin D deficiency     Past Surgical History:  Procedure Laterality Date   CATARACT EXTRACTION W/PHACO Right 02/21/2021   Procedure: CATARACT EXTRACTION PHACO AND INTRAOCULAR LENS PLACEMENT ([place]) RIGHT KAHOOK DUAL BLADE GONIOTOMY;  Surgeon: Leandrew Koyanagi, MD;  Location: [facility];   Service: Ophthalmology;  Laterality: Right;  12.45 01:20.1    Medications Prior to Admission  Medication Sig Dispense Refill Last Dose   acetaminophen (TYLENOL) 325 MG tablet Take by mouth.   prn at prn   ammonium lactate (AMLACTIN) 12 % cream Apply 1 Application topically as directed.   unknown at unknown   brimonidine (ALPHAGAN) 0.2 % ophthalmic solution Place 1 drop into both eyes 2 (two) times daily.   unknown at unknown   cetirizine (ZYRTEC) 10 MG tablet Take 10 mg by mouth daily.   unknown at unknown   Cholecalciferol 1.25 MG (50000 UT) capsule Take 50,000 Units by mouth every 7 (seven) days.   unknown at unknown   diclofenac Sodium (VOLTAREN) 1 % GEL Apply 2 g topically 4 (four) times daily as needed.   prn at prn   docusate sodium (COLACE) 100 MG capsule Take 100 mg by mouth daily.   unknown at unknown   dorzolamide-timolol (COSOPT) 22.3-6.8 MG/ML ophthalmic solution 1 drop 2 (two) times daily.   unknown at unknown   FARXIGA 10 MG TABS tablet Take 10 mg by mouth daily.   unknown at unknown   GAVILAX 17 GM/SCOOP powder Take by mouth.   unknown at unknown   hydrALAZINE (APRESOLINE) 25 MG tablet Take 25 mg by mouth 2 (two) times daily.   unknown at unknown   ketoconazole (NIZORAL) 2 % cream Apply 1 Application topically as directed. Apply as directed once or twice daily.   unknown at unknown   ketoconazole (NIZORAL) 2 % shampoo Massage into  scalp and let sit 10 minutes before washing out twice weekly. 120 mL 2 unknown at unknown   latanoprost (XALATAN) 0.005 % ophthalmic solution Place 1 drop into both eyes at bedtime.   unknown at unknown   Latanoprostene Bunod (VYZULTA) 0.024 % SOLN Apply to eye.   unknown at unknown   loperamide (IMODIUM) 2 MG capsule Take 2 mg by mouth as needed for diarrhea or loose stools.   prn at prn   magnesium hydroxide (MILK OF MAGNESIA) 400 MG/5ML suspension Take 30 mLs by mouth daily as needed for mild constipation.   prn at prn   memantine (NAMENDA) 5 MG  tablet Take 5 mg by mouth 2 (two) times daily.   unknown at unknown   metFORMIN (GLUCOPHAGE) 500 MG tablet Take 500 mg by mouth 2 (two) times daily with a meal.   unknown at unknown   mirabegron ER (MYRBETRIQ) 50 MG TB24 tablet Take 1 tablet (50 mg total) by mouth daily. 30 tablet 11 unknown at unknown   rosuvastatin (CRESTOR) 10 MG tablet Take 10 mg by mouth daily.   unknown at unknown   SENNA-TABS 8.6 MG tablet Take by mouth daily as needed.   prn at prn   triamcinolone ointment (KENALOG) 0.1 % Apply to affected areas twice daily as needed for itchy rash at back. Avoid applying to face, groin, and axilla. Use as directed. Risk of skin atrophy with long-term use reviewed. 80 g 2 prn at prn   valsartan (DIOVAN) 80 MG tablet Take 80 mg by mouth daily.   unknown at unknown   vitamin B-12 (CYANOCOBALAMIN) 1000 MCG tablet Take 1,000 mcg by mouth daily.   unknown at unknown   alum & mag hydroxide-simeth (MAALOX/MYLANTA) 200-200-20 MG/5ML suspension Take 30 mLs by mouth every 6 (six) hours as needed for indigestion or heartburn.   prn at prn   Blood Glucose Monitoring Suppl (FIFTY50 GLUCOSE METER 2.0) w/Device KIT Use as directed. DX:E11.9      conjugated estrogens (PREMARIN) vaginal cream Place 1 Applicatorful vaginally as directed. Twice weekly (Patient not taking: Reported on 02/04/2022)   Not Taking   hydrocortisone 2.5 % cream Apply topically 2 (two) times daily as needed (Rash). (Patient not taking: Reported on 02/04/2022) 30 g 2 Not Taking   mometasone (ELOCON) 0.1 % lotion Apply daily as needed to scalp for itch or scale. Avoid applying to face, groin, and axilla. (Patient not taking: Reported on 02/04/2022) 60 mL 4 Not Taking   Netarsudil Dimesylate (RHOPRESSA) 0.02 % SOLN Place 1 drop into the right eye at bedtime. (Patient not taking: Reported on 02/04/2022)   Not Taking   oxyCODONE-acetaminophen (PERCOCET/ROXICET) 5-325 MG tablet Take 1 tablet by mouth every 6 (six) hours as needed for severe pain.  (Patient not taking: Reported on 02/04/2022) 8 tablet 0 Not Taking   Social History   Socioeconomic History   Marital status: Widowed    Spouse name: Not on file   Number of children: Not on file   Years of education: Not on file   Highest education level: Not on file  Occupational History   Not on file  Tobacco Use   Smoking status: Never   Smokeless tobacco: Never  Vaping Use   Vaping Use: Never used  Substance and Sexual Activity   Alcohol use: Never   Drug use: Never   Sexual activity: Not on file  Other Topics Concern   Not on file  Social History Narrative   Lives at assisted  living facility- [facility].   Social Determinants of Health   Financial Resource Strain: Not on file  Food Insecurity: Not on file  Transportation Needs: Not on file  Physical Activity: Not on file  Stress: Not on file  Social Connections: Not on file  Intimate Partner Violence: Not on file    No family history on file.    PHYSICAL EXAM General: Elderly caucasian female, in no acute distress. Sitting upright in hospital bed with daughter at bedside HEENT:  Normocephalic and atraumatic. Neck:  No JVD.  Lungs: Normal respiratory effort on room air. Decreased breath sounds bilaterally without appreciable crackles or wheezes Heart: bradycardic but regular Normal S1 and S2 without gallops or murmurs.  Abdomen: Non-distended appearing.  Msk: Normal strength and tone for age. Extremities: Warm and well perfused. No clubbing, cyanosis. No peripheral edema.  Neuro: Alert and oriented to self, "hospital" and appropriately identifies her daughter at bedside.  Psych:  Answers questions appropriately.   Labs: Basic Metabolic Panel: Recent Labs    02/03/22 2118 02/04/22 0237 02/04/22 1022  NA 143  --  145  K 4.7  --  4.2  CL 105  --  116*  CO2 21*  --  20*  GLUCOSE 153*  --  106*  BUN 76*  --  72*  CREATININE 2.15* 2.03* 1.78*  CALCIUM 10.1  --  9.6  MG  --   --  2.5*   Liver Function  Tests: Recent Labs    02/03/22 2118  AST 26  ALT 11  ALKPHOS 52  BILITOT 1.1  PROT 8.2*  ALBUMIN 4.2   No results for input(s): "LIPASE", "AMYLASE" in the last 72 hours. CBC: Recent Labs    02/04/22 0237 02/05/22 0825  WBC 6.4 5.3  NEUTROABS  --  3.4  HGB 10.8* 10.0*  HCT 35.1* 31.3*  MCV 91.6 87.7  PLT 284 255   Cardiac Enzymes: Recent Labs    02/04/22 0039 02/04/22 0237 02/04/22 0631  TROPONINIHS 23* 24* 20*   BNP: Invalid input(s): "POCBNP" D-Dimer: Recent Labs    02/04/22 0237 02/05/22 0825  DDIMER 1.01* 0.79*   Hemoglobin A1C: No results for input(s): "HGBA1C" in the last 72 hours. Fasting Lipid Panel: No results for input(s): "CHOL", "HDL", "LDLCALC", "TRIG", "CHOLHDL", "LDLDIRECT" in the last 72 hours. Thyroid Function Tests: No results for input(s): "TSH", "T4TOTAL", "T3FREE", "THYROIDAB" in the last 72 hours.  Invalid input(s): "FREET3" Anemia Panel: Recent Labs    02/04/22 0237  FERRITIN 376*    CT ABDOMEN PELVIS WO CONTRAST  Result Date: 02/03/2022 CLINICAL DATA:  Abdominal pain, acute, nonlocalized EXAM: CT ABDOMEN AND PELVIS WITHOUT CONTRAST TECHNIQUE: Multidetector CT imaging of the abdomen and pelvis was performed following the standard protocol without IV contrast. RADIATION DOSE REDUCTION: This exam was performed according to the departmental dose-optimization program which includes automated exposure control, adjustment of the mA and/or kV according to patient size and/or use of iterative reconstruction technique. COMPARISON:  None Available. FINDINGS: Lower chest: Moderate right coronary artery calcification. Global cardiac size within normal limits. No acute abnormality. Hepatobiliary: Cholelithiasis without pericholecystic inflammatory change. Liver unremarkable on this noncontrast examination. No intra or extrahepatic biliary ductal dilation. Pancreas: Unremarkable Spleen: Unremarkable Adrenals/Urinary Tract: Adrenal glands are  unremarkable. Kidneys are normal, without renal calculi, focal lesion, or hydronephrosis. Bladder is unremarkable. Stomach/Bowel: Severe distal colonic diverticulosis involving the distal transverse, descending, and sigmoid colon. No superimposed acute inflammatory change. Stomach, small bowel, and large bowel are unremarkable. Appendix normal.  No free intraperitoneal gas or fluid. Vascular/Lymphatic: Extensive aortoiliac atherosclerotic calcification. No aortic aneurysm. Particularly prominent atherosclerotic calcification involving the mesenteric and renal artery origins, however, the degree of stenosis is not well assessed on this noncontrast examination. No pathologic adenopathy within the abdomen and pelvis. Reproductive: Status post hysterectomy. No adnexal masses. Other: No abdominal wall hernia.  Rectum unremarkable. Musculoskeletal: Remote inferior endplate fracture Q76 with no significant loss of height. No acute bone abnormality. Degenerative changes are seen within the lumbar spine. No lytic or blastic bone lesion. IMPRESSION: 1. No acute intra-abdominal pathology identified. No definite radiographic explanation for the patient's reported symptoms. 2. Moderate right coronary artery calcification. 3. Cholelithiasis. 4. Severe distal colonic diverticulosis without superimposed acute inflammatory change. 5. Peripheral vascular disease. Suspected hemodynamically significant mesenteric and renal artery stenoses. If there is clinical evidence of chronic mesenteric ischemia or hemodynamically significant renal artery stenosis, CT arteriography may be more helpful for further evaluation. Aortic Atherosclerosis (ICD10-I70.0). Electronically Signed   By: Fidela Salisbury M.D.   On: 02/03/2022 23:26   CT HEAD WO CONTRAST (5MM)  Result Date: 02/03/2022 CLINICAL DATA:  Head trauma, minor (Age >= 65y). Altered mental status. EXAM: CT HEAD WITHOUT CONTRAST TECHNIQUE: Contiguous axial images were obtained from the base  of the skull through the vertex without intravenous contrast. RADIATION DOSE REDUCTION: This exam was performed according to the departmental dose-optimization program which includes automated exposure control, adjustment of the mA and/or kV according to patient size and/or use of iterative reconstruction technique. COMPARISON:  None Available. FINDINGS: Brain: Normal anatomic configuration. Parenchymal volume loss is commensurate with the patient's age. Mild periventricular white matter changes are present likely reflecting the sequela of small vessel ischemia. Remote lacunar infarct within the right thalamus and left globus pallidus. No abnormal intra or extra-axial mass lesion or fluid collection. No abnormal mass effect or midline shift. No evidence of acute intracranial hemorrhage or infarct. Ventricular size is normal. Cerebellum unremarkable. Vascular: No asymmetric hyperdense vasculature at the skull base. Extensive atherosclerotic calcification within the carotid siphons. Skull: Intact Sinuses/Orbits: Paranasal sinuses are clear. Orbits are unremarkable. Other: Left mastoidectomy has been performed. Additionally, the auditory ossicles are absent suggesting prior resection. Debris or packing material is noted within the resection bed. Right mastoid air cells and middle ear cavity is clear. IMPRESSION: 1. No acute intracranial abnormality. 2. Mild senescent change and remote lacunar infarcts. 3. Postsurgical changes involving the left mastoid air cells and middle ear cavity. Debris versus residual packing material within the a resection bed. Electronically Signed   By: Fidela Salisbury M.D.   On: 02/03/2022 23:17   DG Chest Portable 1 View  Result Date: 02/03/2022 CLINICAL DATA:  Sob.  Recent covid. Cough, sob, nausea, vomiting. EXAM: PORTABLE CHEST 1 VIEW COMPARISON:  Chest x-ray 06/10/2021 FINDINGS: The heart and mediastinal contours are unchanged. Aortic calcification. No focal consolidation. No pulmonary  edema. No pleural effusion. No pneumothorax. No acute osseous abnormality. Right shoulder rotator cuff anchor suture. IMPRESSION: 1. No active disease. 2.  Aortic Atherosclerosis (ICD10-I70.0). Electronically Signed   By: Iven Finn M.D.   On: 02/03/2022 22:45     Radiology: CT ABDOMEN PELVIS WO CONTRAST  Result Date: 02/03/2022 CLINICAL DATA:  Abdominal pain, acute, nonlocalized EXAM: CT ABDOMEN AND PELVIS WITHOUT CONTRAST TECHNIQUE: Multidetector CT imaging of the abdomen and pelvis was performed following the standard protocol without IV contrast. RADIATION DOSE REDUCTION: This exam was performed according to the departmental dose-optimization program which includes automated exposure control, adjustment of  the mA and/or kV according to patient size and/or use of iterative reconstruction technique. COMPARISON:  None Available. FINDINGS: Lower chest: Moderate right coronary artery calcification. Global cardiac size within normal limits. No acute abnormality. Hepatobiliary: Cholelithiasis without pericholecystic inflammatory change. Liver unremarkable on this noncontrast examination. No intra or extrahepatic biliary ductal dilation. Pancreas: Unremarkable Spleen: Unremarkable Adrenals/Urinary Tract: Adrenal glands are unremarkable. Kidneys are normal, without renal calculi, focal lesion, or hydronephrosis. Bladder is unremarkable. Stomach/Bowel: Severe distal colonic diverticulosis involving the distal transverse, descending, and sigmoid colon. No superimposed acute inflammatory change. Stomach, small bowel, and large bowel are unremarkable. Appendix normal. No free intraperitoneal gas or fluid. Vascular/Lymphatic: Extensive aortoiliac atherosclerotic calcification. No aortic aneurysm. Particularly prominent atherosclerotic calcification involving the mesenteric and renal artery origins, however, the degree of stenosis is not well assessed on this noncontrast examination. No pathologic adenopathy within the  abdomen and pelvis. Reproductive: Status post hysterectomy. No adnexal masses. Other: No abdominal wall hernia.  Rectum unremarkable. Musculoskeletal: Remote inferior endplate fracture D32 with no significant loss of height. No acute bone abnormality. Degenerative changes are seen within the lumbar spine. No lytic or blastic bone lesion. IMPRESSION: 1. No acute intra-abdominal pathology identified. No definite radiographic explanation for the patient's reported symptoms. 2. Moderate right coronary artery calcification. 3. Cholelithiasis. 4. Severe distal colonic diverticulosis without superimposed acute inflammatory change. 5. Peripheral vascular disease. Suspected hemodynamically significant mesenteric and renal artery stenoses. If there is clinical evidence of chronic mesenteric ischemia or hemodynamically significant renal artery stenosis, CT arteriography may be more helpful for further evaluation. Aortic Atherosclerosis (ICD10-I70.0). Electronically Signed   By: Fidela Salisbury M.D.   On: 02/03/2022 23:26   CT HEAD WO CONTRAST (5MM)  Result Date: 02/03/2022 CLINICAL DATA:  Head trauma, minor (Age >= 65y). Altered mental status. EXAM: CT HEAD WITHOUT CONTRAST TECHNIQUE: Contiguous axial images were obtained from the base of the skull through the vertex without intravenous contrast. RADIATION DOSE REDUCTION: This exam was performed according to the departmental dose-optimization program which includes automated exposure control, adjustment of the mA and/or kV according to patient size and/or use of iterative reconstruction technique. COMPARISON:  None Available. FINDINGS: Brain: Normal anatomic configuration. Parenchymal volume loss is commensurate with the patient's age. Mild periventricular white matter changes are present likely reflecting the sequela of small vessel ischemia. Remote lacunar infarct within the right thalamus and left globus pallidus. No abnormal intra or extra-axial mass lesion or fluid  collection. No abnormal mass effect or midline shift. No evidence of acute intracranial hemorrhage or infarct. Ventricular size is normal. Cerebellum unremarkable. Vascular: No asymmetric hyperdense vasculature at the skull base. Extensive atherosclerotic calcification within the carotid siphons. Skull: Intact Sinuses/Orbits: Paranasal sinuses are clear. Orbits are unremarkable. Other: Left mastoidectomy has been performed. Additionally, the auditory ossicles are absent suggesting prior resection. Debris or packing material is noted within the resection bed. Right mastoid air cells and middle ear cavity is clear. IMPRESSION: 1. No acute intracranial abnormality. 2. Mild senescent change and remote lacunar infarcts. 3. Postsurgical changes involving the left mastoid air cells and middle ear cavity. Debris versus residual packing material within the a resection bed. Electronically Signed   By: Fidela Salisbury M.D.   On: 02/03/2022 23:17   DG Chest Portable 1 View  Result Date: 02/03/2022 CLINICAL DATA:  Sob.  Recent covid. Cough, sob, nausea, vomiting. EXAM: PORTABLE CHEST 1 VIEW COMPARISON:  Chest x-ray 06/10/2021 FINDINGS: The heart and mediastinal contours are unchanged. Aortic calcification. No focal consolidation. No pulmonary edema.  No pleural effusion. No pneumothorax. No acute osseous abnormality. Right shoulder rotator cuff anchor suture. IMPRESSION: 1. No active disease. 2.  Aortic Atherosclerosis (ICD10-I70.0). Electronically Signed   By: Iven Finn M.D.   On: 02/03/2022 22:45    ECHO 05/10/2019 IMPRESSIONS    1. Left ventricular ejection fraction, by visual estimation, is 60 to  65%. The left ventricle has normal function. There is no left ventricular  hypertrophy.   2. Global right ventricle has normal systolic function.The right  ventricular size is normal. No increase in right ventricular wall  thickness.   3. Left atrial size was normal.   4. Right atrial size was normal.   5. The  mitral valve is normal in structure. Mild to moderate mitral valve  regurgitation. No evidence of mitral stenosis.   6. The tricuspid valve is normal in structure. Tricuspid valve  regurgitation is mild.   7. The aortic valve is normal in structure. Aortic valve regurgitation is  not visualized. No evidence of aortic valve sclerosis or stenosis.   8. The pulmonic valve was normal in structure. Pulmonic valve  regurgitation is not visualized.   9. Mildly elevated pulmonary artery systolic pressure.  10. The inferior vena cava is normal in size with greater than 50%  respiratory variability, suggesting right atrial pressure of 3 mmHg.   FINDINGS   Left Ventricle: Left ventricular ejection fraction, by visual estimation,  is 60 to 65%. The left ventricle has normal function. There is no left  ventricular hypertrophy. Normal left atrial pressure.   Right Ventricle: The right ventricular size is normal. No increase in  right ventricular wall thickness. Global RV systolic function is has  normal systolic function. The tricuspid regurgitant velocity is 2.67 m/s,  and with an assumed right atrial pressure   of 10 mmHg, the estimated right ventricular systolic pressure is mildly  elevated at 38.5 mmHg.   Left Atrium: Left atrial size was normal in size.   Right Atrium: Right atrial size was normal in size   Pericardium: There is no evidence of pericardial effusion.   Mitral Valve: The mitral valve is normal in structure. No evidence of  mitral valve stenosis by observation. MV peak gradient, 4.0 mmHg. Mild to  moderate mitral valve regurgitation.   Tricuspid Valve: The tricuspid valve is normal in structure. Tricuspid  valve regurgitation is mild.   Aortic Valve: The aortic valve is normal in structure. Aortic valve  regurgitation is not visualized. The aortic valve is structurally normal,  with no evidence of sclerosis or stenosis. Aortic valve mean gradient  measures 4.0 mmHg. Aortic  valve peak  gradient measures 8.5 mmHg. Aortic valve area, by VTI measures 1.59 cm.   Pulmonic Valve: The pulmonic valve was normal in structure. Pulmonic valve  regurgitation is not visualized.   Aorta: The aortic root, ascending aorta and aortic arch are all  structurally normal, with no evidence of dilitation or obstruction.   Venous: The inferior vena cava is normal in size with greater than 50%  respiratory variability, suggesting right atrial pressure of 3 mmHg.   IAS/Shunts: No atrial level shunt detected by color flow Doppler. There is  no evidence of a patent foramen ovale. No ventricular septal defect is  seen or detected. There is no evidence of an atrial septal defect.      LEFT VENTRICLE  PLAX 2D  LVIDd:         4.95 cm  Diastology  LVIDs:  3.13 cm  LV e' lateral:   9.03 cm/s  LV PW:         0.99 cm  LV E/e' lateral: 9.5  LV IVS:        0.66 cm  LV e' medial:    4.03 cm/s  LVOT diam:     1.80 cm  LV E/e' medial:  21.3  LV SV:         77 ml  LV SV Index:   37.33  LVOT Area:     2.54 cm      LEFT ATRIUM             Index  LA diam:        3.70 cm 1.85 cm/m  LA Vol (A2C):   44.6 ml 22.30 ml/m  LA Vol (A4C):   70.1 ml 35.06 ml/m  LA Biplane Vol: 56.6 ml 28.30 ml/m   AORTIC VALVE                   PULMONIC VALVE  AV Area (Vmax):    1.80 cm    PV Vmax:       1.05 m/s  AV Area (Vmean):   1.71 cm    PV Vmean:      73.500 cm/s  AV Area (VTI):     1.59 cm    PV VTI:        0.263 m  AV Vmax:           146.00 cm/s PV Peak grad:  4.4 mmHg  AV Vmean:          91.200 cm/s PV Mean grad:  2.0 mmHg  AV VTI:            0.391 m  AV Peak Grad:      8.5 mmHg  AV Mean Grad:      4.0 mmHg  LVOT Vmax:         103.00 cm/s  LVOT Vmean:        61.400 cm/s  LVOT VTI:          0.244 m  LVOT/AV VTI ratio: 0.62     AORTA  Ao Root diam: 3.00 cm   MITRAL VALVE                        TRICUSPID VALVE  MV Area (PHT): 2.60 cm             TR Peak grad:   28.5 mmHg  MV Peak  grad:  4.0 mmHg             TR Vmax:        267.00 cm/s  MV Mean grad:  1.0 mmHg  MV Vmax:       1.00 m/s             SHUNTS  MV Vmean:      55.1 cm/s            Systemic VTI:  0.24 m  MV VTI:        0.37 m               Systemic Diam: 1.80 cm  MV PHT:        84.68 msec  MV Decel Time: 292 msec  MV E velocity: 85.90 cm/s 103 cm/s  MV A velocity: 69.40 cm/s 70.3 cm/s  MV E/A ratio:  1.24       1.5  Isaias Cowman MD  Electronically signed by Isaias Cowman MD  Signature Date/Time: 05/11/2019/1:17:54 PM   TELEMETRY reviewed by me (LT) 02/05/2022 : Sinus bradycardia with rate in the mid 40s to mid 50s with paroxysms up to sinus rhythm in the 60s with activity  EKG reviewed by me: Sinus bradycardia with PACs RBBB rate 50  Data reviewed by me (LT) 02/05/2022: Hospitalist progress note, admission H&P, ED provider note, CBC, BMP, CT abdomen pelvis, telemetry, troponins  ASSESSMENT AND PLAN:   Martha Walker is an 34yoF with a PMH of dementia, CKD 3, type 2 diabetes, hyperlipidemia, hypertension, carotid artery stenosis who presented to [facility] ED from her assisted living facility on 02/03/2022 with progressive weakness, N/V/D, and confusion above her baseline.  She was positive for COVID-19 on admission with a revealed significant leukocytes for which acute cystitis was suspected.  During her hospitalization she has been bradycardic with heart rate between 30s - 60s for which cardiology is consulted for further assistance.  #Acute cystitis #COVID-19 #Asymptomatic sinus bradycardia The patient presented from her assisted living facility with generalized weakness, nausea, vomiting, diarrhea, and confusion above her baseline dementia.  UA consistent with acute cystitis, incidentally COVID-19 positive on admission.  Telemetry during admission has shown sinus bradycardia with heart rates in the mid 40s mostly, appropriately increasing to the 50s and 60s, even low 100s per physical therapy with  activity.  There has not been evidence of high degree AV block or long sinus pauses on telemetry since admission. -Agree with current therapy for treatment of UTI and COVID-19 -Continuous monitoring on telemetry while inpatient -Avoid beta-blockers, other AV nodal blocking agents -Monitor and replete electrolytes as needed to maintain K >4, mag >2 -No indication for permanent pacemaker at this time.  Cardiology will sign off.  Please reengage if needed.  This patient's plan of care was discussed and created with Dr. Nehemiah Massed and he is in agreement.  Signed: Tristan Schroeder , PA-C 02/05/2022, 10:33 AM [facility]

## 2022-02-05 NOTE — Evaluation (Signed)
Occupational Therapy Evaluation Patient Details Name: Martha Walker MRN: [id number] DOB: 1931-12-12 Today's Date: 02/05/2022   History of Present Illness 88 year old female with history of carotid stenosis, CKD stage IIIa, diabetes type 2, hyperlipidemia, hypertension, peripheral vascular disease who presented from ALF with complaint of progressive weakness, nausea, vomiting, diarrhea, confusion.   Clinical Impression   Pt was seen for OT evaluation this date. Prior to hospital admission, pt was living at an ILF and indep with basic ADL and mobility, receiving assist from staff for medication mgt. Pt presents to acute OT demonstrating impaired ADL performance and functional mobility 2/2 decreased safety, strength, balance, and activity tolerance (See OT problem list). Pt currently requires MIN A for ADL transfers, set up and CGA for standing pericare, and CGA-MIN A for ADL mobility with VC for RW mgt. Pt would benefit from skilled OT services to address noted impairments and functional limitations (see below for any additional details) in order to maximize safety and independence while minimizing falls risk and caregiver burden. Upon hospital discharge, recommend STR to maximize pt safety and return to PLOF.    Recommendations for follow up therapy are one component of a multi-disciplinary discharge planning process, led by the attending physician.  Recommendations may be updated based on patient status, additional functional criteria and insurance authorization.   Follow Up Recommendations  Skilled nursing-short term rehab (<3 hours/day)    Assistance Recommended at Discharge Frequent or constant Supervision/Assistance  Patient can return home with the following A little help with walking and/or transfers;A lot of help with bathing/dressing/bathroom;Assistance with cooking/housework;Assist for transportation;Help with stairs or ramp for entrance    Functional Status Assessment  Patient has had a  recent decline in their functional status and demonstrates the ability to make significant improvements in function in a reasonable and predictable amount of time.  Equipment Recommendations  BSC/3in1;Other (comment) (2WW)    Recommendations for Other Services       Precautions / Restrictions Precautions Precautions: Fall Restrictions Weight Bearing Restrictions: No      Mobility Bed Mobility Overal bed mobility: Needs Assistance Bed Mobility: Sit to Supine       Sit to supine: Mod assist   General bed mobility comments: MOD A for BLE mgt    Transfers Overall transfer level: Needs assistance Equipment used: Rolling walker (2 wheels) Transfers: Sit to/from Stand Sit to Stand: Min assist, From elevated surface                  Balance Overall balance assessment: Needs assistance Sitting-balance support: Bilateral upper extremity supported, Single extremity supported, Feet supported Sitting balance-Leahy Scale: Good     Standing balance support: Single extremity supported, During functional activity Standing balance-Leahy Scale: Fair Standing balance comment: able to complete pericare in standing with CGA and UE support on RW                           ADL either performed or assessed with clinical judgement   ADL                                         General ADL Comments: Pt currently requires MIN A for toilet transfers to [facility] with RW, set up and CGA for standing pericare, and MIN A for LB ADL tasks.     Vision  Perception     Praxis      Pertinent Vitals/Pain Pain Assessment Pain Assessment: No/denies pain     Hand Dominance     Extremity/Trunk Assessment Upper Extremity Assessment Upper Extremity Assessment: Generalized weakness   Lower Extremity Assessment Lower Extremity Assessment: Generalized weakness       Communication Communication Communication: No difficulties   Cognition  Arousal/Alertness: Awake/alert Behavior During Therapy: WFL for tasks assessed/performed Overall Cognitive Status: Impaired/Different from baseline                                 General Comments: some baseline dementia, daughter reports more confused than normal the last week+     General Comments       Exercises Other Exercises Other Exercises: Pt instructed in RW mgt to maximize safety with mobility   Shoulder Instructions      Home Living Family/patient expects to be discharged to:: Skilled nursing facility                                 Additional Comments: Pt at independent living facility, daughter feels that she may need to transition to higher level of care      Prior Functioning/Environment Prior Level of Function : Needs assist             Mobility Comments: has been at ILF where she can go to the bathroom, dining area, etc w/o AD - help with meds          OT Problem List: Decreased strength;Decreased activity tolerance;Decreased safety awareness;Impaired balance (sitting and/or standing);Decreased knowledge of use of DME or AE      OT Treatment/Interventions: Self-care/ADL training;Therapeutic exercise;Therapeutic activities;DME and/or AE instruction;Patient/family education;Balance training;Cognitive remediation/compensation    OT Goals(Current goals can be found in the care plan section) Acute Rehab OT Goals Patient Stated Goal: get better OT Goal Formulation: With patient Time For Goal Achievement: 02/19/22 Potential to Achieve Goals: Good ADL Goals Pt Will Perform Lower Body Dressing: sit to/from stand;with min guard assist Pt Will Transfer to Toilet: with supervision;ambulating;bedside commode (LRAD) Pt Will Perform Toileting - Clothing Manipulation and hygiene: with modified independence Additional ADL Goal #1: Pt will complete bathing task primarily in sitting with MIN A and supv throughout.  OT Frequency: Min  2X/week    Co-evaluation              AM-PAC OT "6 Clicks" Daily Activity     Outcome Measure Help from another person eating meals?: None Help from another person taking care of personal grooming?: A Little Help from another person toileting, which includes using toliet, bedpan, or urinal?: A Little Help from another person bathing (including washing, rinsing, drying)?: A Little Help from another person to put on and taking off regular upper body clothing?: A Little Help from another person to put on and taking off regular lower body clothing?: A Little 6 Click Score: 19   End of Session Equipment Utilized During Treatment: Rolling walker (2 wheels) Nurse Communication: Mobility status  Activity Tolerance: Patient tolerated treatment well Patient left: in bed;with call bell/phone within reach;with bed alarm set  OT Visit Diagnosis: Other abnormalities of gait and mobility (R26.89);Muscle weakness (generalized) (M62.81)                Time: 0258-5277 OT Time Calculation (min): 17 min Charges:  OT General Charges $  OT Visit: 1 Visit OT Evaluation $OT Eval Moderate Complexity: 1 Mod OT Treatments $Self Care/Home Management : 8-22 mins  Ardeth Perfect., MPH, MS, OTR/L ascom [phone] 02/05/22, 2:38 PM

## 2022-02-05 NOTE — Evaluation (Signed)
Physical Therapy Evaluation Patient Details Name: Martha Walker MRN: [id number] DOB: [birth year] Today's Date: 02/05/2022  History of Present Illness  88 year old female with history of carotid stenosis, CKD stage IIIa, diabetes type 2, hyperlipidemia, hypertension, peripheral vascular disease who presented from ALF with complaint of progressive weakness, nausea, vomiting, diarrhea, confusion.  Clinical Impression  Pt showed good effort with PT exam and in-room mobility but she is clearly weaker and more limited than her recent baseline.  Pt apparently in ILF with no AD and able to be up and walking around ad lib (some supervision 2/2 cognition).  Today she clearly needed UE use on walker and struggled with all mobility tasks (apparently she sleeps in lift chair, does not need to transition to/from supine, and raises it significantly to get up).  Pt has had HR mostly in the 40-50s range since arrival, with activity BPMs rose relatively quickly to the 80-90 range and then continued to climb to mid 110s after ~60 ft of ambulation in the room. Pt fatigued with the effort, not at baseline, recommending short term rehab and use of walker at this time.      Recommendations for follow up therapy are one component of a multi-disciplinary discharge planning process, led by the attending physician.  Recommendations may be updated based on patient status, additional functional criteria and insurance authorization.  Follow Up Recommendations Skilled nursing-short term rehab (<3 hours/day) Can patient physically be transported by private vehicle: No    Assistance Recommended at Discharge Frequent or constant Supervision/Assistance  Patient can return home with the following  A little help with bathing/dressing/bathroom;Assistance with cooking/housework;Direct supervision/assist for medications management;Assist for transportation    Equipment Recommendations Rolling walker (2 wheels)  Recommendations for  Other Services       Functional Status Assessment Patient has had a recent decline in their functional status and demonstrates the ability to make significant improvements in function in a reasonable and predictable amount of time.     Precautions / Restrictions Precautions Precautions: Fall Restrictions Weight Bearing Restrictions: No      Mobility  Bed Mobility Overal bed mobility: Needs Assistance Bed Mobility: Supine to Sit, Sit to Supine     Supine to sit: Mod assist Sit to supine: Mod assist   General bed mobility comments: Pt apparently sleeps in lift chair, never gets in/out of bed    Transfers Overall transfer level: Needs assistance Equipment used: Rolling walker (2 wheels) Transfers: Sit to/from Stand Sit to Stand: Min assist           General transfer comment: Pt could not initiate getting to standing w/o some assist, even raised bed ~3".    Ambulation/Gait Ambulation/Gait assistance: Min assist Gait Distance (Feet): 60 Feet Assistive device: Rolling walker (2 wheels)         General Gait Details: Pt was able to ambulate in room with out needing direct assist.  However at baseline pt does not need/use AD and today she was clearly reliant on it with narrow BOS and very slow/guarded gait.  HR at rest in the 40-50s range, BPMs rose quickly with activity to 90s and then slowly up to even 115 by the end of the ambulation effort.  Stairs            Wheelchair Mobility    Modified Rankin (Stroke Patients Only)       Balance Overall balance assessment: Needs assistance Sitting-balance support: Bilateral upper extremity supported Sitting balance-Leahy Scale: Good     Standing  balance support: Bilateral upper extremity supported Standing balance-Leahy Scale: Fair                               Pertinent Vitals/Pain Pain Assessment Pain Assessment:  (general c/o pain with any palpation that did not seem to functionally limit her)     Home Living Family/patient expects to be discharged to:: Skilled nursing facility                   Additional Comments: Pt at independent living facility, daughter feels that she may need to transition to higher level of care    Prior Function Prior Level of Function : Needs assist             Mobility Comments: as been at ILF where she can go to the bathroom, dining area, etc w/o AD - help with meds       Hand Dominance        Extremity/Trunk Assessment   Upper Extremity Assessment Upper Extremity Assessment: Generalized weakness    Lower Extremity Assessment Lower Extremity Assessment: Generalized weakness       Communication   Communication: No difficulties  Cognition Arousal/Alertness: Awake/alert Behavior During Therapy: Anxious Overall Cognitive Status: Impaired/Different from baseline                                 General Comments: some baseline dementia, daughter reports more confused than normal the last week+        General Comments      Exercises     Assessment/Plan    PT Assessment Patient needs continued PT services  PT Problem List Decreased strength;Decreased range of motion;Decreased activity tolerance;Decreased balance;Decreased mobility;Decreased knowledge of use of DME;Decreased safety awareness       PT Treatment Interventions DME instruction;Gait training;Stair training;Therapeutic activities;Therapeutic exercise;Functional mobility training;Balance training;Cognitive remediation;Patient/family education    PT Goals (Current goals can be found in the Care Plan section)  Acute Rehab PT Goals Patient Stated Goal: walk w/o walker again PT Goal Formulation: With patient/family Time For Goal Achievement: 02/18/22 Potential to Achieve Goals: Fair    Frequency Min 2X/week     Co-evaluation               AM-PAC PT "6 Clicks" Mobility  Outcome Measure Help needed turning from your back to your side  while in a flat bed without using bedrails?: A Little Help needed moving from lying on your back to sitting on the side of a flat bed without using bedrails?: A Lot Help needed moving to and from a bed to a chair (including a wheelchair)?: A Little Help needed standing up from a chair using your arms (e.g., wheelchair or bedside chair)?: A Little Help needed to walk in hospital room?: A Little Help needed climbing 3-5 steps with a railing? : A Lot 6 Click Score: 16    End of Session Equipment Utilized During Treatment: Gait belt Activity Tolerance: Patient limited by fatigue;Patient tolerated treatment well Patient left: with bed alarm set;with call bell/phone within reach;with family/visitor present Nurse Communication: Mobility status (needs recliner in room) PT Visit Diagnosis: Unsteadiness on feet (R26.81);Muscle weakness (generalized) (M62.81);Difficulty in walking, not elsewhere classified (R26.2)    Time: [id number] PT Time Calculation (min) (ACUTE ONLY): 32 min   Charges:   PT Evaluation $PT Eval Low Complexity: 1 Low PT Treatments $Gait  Training: 8-22 mins        Kreg Shropshire, DPT 02/05/2022, 12:32 PM

## 2022-02-05 NOTE — Plan of Care (Signed)
  Problem: Respiratory: Goal: Will maintain a patent airway Outcome: Progressing   Problem: Respiratory: Goal: Complications related to the disease process, condition or treatment will be avoided or minimized Outcome: Progressing   Problem: Education: Goal: Knowledge of General Education information will improve Description: Including pain rating scale, medication(s)/side effects and non-pharmacologic comfort measures Outcome: Progressing

## 2022-02-05 NOTE — Progress Notes (Addendum)
PROGRESS NOTE  Martha Walker  PVV:[id number] DOB: May 26, 1932 DOA: 02/03/2022 PCP: Leonel Ramsay, MD   Brief Narrative: Patient is a 88 year old female with history of carotid stenosis, CKD stage IIIa, diabetes type 2, hyperlipidemia, hypertension, peripheral vascular disease who presented from ALF with complaint of progressive weakness, nausea, vomiting, diarrhea, confusion.  On presentation she was hemodynamically stable, on room air.  Lab work showed creatinine of 2.15, baseline creatinine around 1.3.  COVID screen test positive.  UA showed significant leukocytes, UTI suspected, started on ceftriaxone, urine culture sent.  Hospital course remarkable for intermittent  bradycardia.  Cardiology consulted, no plan for intervention.  PT/OT consulted for weakness.  Assessment & Plan:  Principal Problem:   COVID-19  COVID infection: Presented with nausea, vomiting, diarrhea, weakness. Suspected  COVID gastroenteritis.  The symptoms have resolved now.  Currently on molnupiravir.   UTI: UA was suggestive  of UTI with some few leukocytes.  Urine culture sent,pending.  Start on ceftriaxone.  Unclear if patient has dysuria   Bradycardia: She is mostly asymptomatic.  Patient heart rate runs low to 40.  Heart rate fluctuating between 40-70.  There was also report of significant pauses of about 2 seconds.  Reviewed her previous records, on her last admission,she was seen by Dr. 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  Cardiology consulted, recommending conservative management.  Monitor on telemetry.  Avoid AV nodal blocker agents.  Electrolytes stable   Confusion/Dementia : Likely from metabolic encephalopathy from UTI, COVID.  As per the daughter, she is intermittently confused, she might have underlying dementia. Takes memantine. continue supportive care, frequent reorientation, delirium precautions.  CT head showed remote lacunar infarcts, no acute findings   AKI in CKD stage  IIIa: Baseline creatinine 1.3.  Presented with creatinine in the range of 2.  Now back to baseline with IV fluid, IV fluid discontinued   Bilateral carotid stenosis: On Plavix, statin   Diabetes type 2: On metformin at home.  Continue sliding scale insulin.  Blood sugars   Hyperlipidemia: On statin at home   Hypertension: Currently blood pressure stable.  Hydralazine resumed.  ARB on hold due to AKI   Peripheral vascular disease: CT abdomen/pelvis showed hemodynamically significant mesenteric and renal artery stenoses.  Further work-up as an outpatient, cannot do contrast studies like CT angiogram due  her kidney function.  Questionable significance of further work-up due to her advanced age  Weakness/disposition: Patient is from ALF.  PT/OT consulted.  Daughter prefers SNF or different ALF.  TOC following     Pressure Injury 02/04/22 Sacrum Medial Stage 2 -  Partial thickness loss of dermis presenting as a shallow open injury with a red, pink wound bed without slough. 3x3 open area to skin (Active)  02/04/22 0430  Location: Sacrum  Location Orientation: Medial  Staging: Stage 2 -  Partial thickness loss of dermis presenting as a shallow open injury with a red, pink wound bed without slough.  Wound Description (Comments): 3x3 open area to skin  Present on Admission: Yes  Dressing Type Foam - Lift dressing to assess site every shift 02/05/22 0957    DVT prophylaxis:heparin injection 5,000 Units Start: 02/04/22 0600     Code Status: Full Code  Family Communication: Daughter at bedside  Patient status: Inpatient  Patient is from : ALF  Anticipated discharge to:ALF vs SNF  Estimated DC date:2-3 days   Consultants: Cardiology  Procedures:None  Antimicrobials:  Anti-infectives (From admission, onward)    Start  Dose/Rate Route Frequency Ordered Stop   02/05/22 2200  cefTRIAXone (ROCEPHIN) 1 g in sodium chloride 0.9 % 100 mL IVPB        1 g 200 mL/hr over 30 Minutes  Intravenous Every 24 hours 02/05/22 0813     02/05/22 1000  remdesivir 100 mg in sodium chloride 0.9 % 100 mL IVPB  Status:  Discontinued       See Hyperspace for full Linked Orders Report.   100 mg 200 mL/hr over 30 Minutes Intravenous Daily 02/04/22 0359 02/04/22 1130   02/04/22 2200  cefTRIAXone (ROCEPHIN) 2 g in sodium chloride 0.9 % 100 mL IVPB  Status:  Discontinued        2 g 200 mL/hr over 30 Minutes Intravenous Every 24 hours 02/04/22 0203 02/05/22 0813   02/04/22 1230  molnupiravir EUA (LAGEVRIO) capsule 800 mg        4 capsule Oral 2 times daily 02/04/22 1130 02/09/22 0959   02/04/22 0800  remdesivir 200 mg in sodium chloride 0.9% 250 mL IVPB       See Hyperspace for full Linked Orders Report.   200 mg 580 mL/hr over 30 Minutes Intravenous Once 02/04/22 0359 02/04/22 1111   02/04/22 0215  nirmatrelvir/ritonavir EUA (renal dosing) (PAXLOVID) 2 tablet  Status:  Discontinued        2 tablet Oral 2 times daily 02/04/22 0202 02/04/22 0357   02/03/22 2215  cefTRIAXone (ROCEPHIN) 2 g in sodium chloride 0.9 % 100 mL IVPB        2 g 200 mL/hr over 30 Minutes Intravenous  Once 02/03/22 2211 02/04/22 0037       Subjective: Patient seen and examined at the bedside this morning.  Hemodynamically stable.  Lying in bed.  Sleeping when I arrived.  Daughter at bedside.  Looks weak, deconditioned but overall comfortable.  Blood pressure stable, heart rate ranging from 50-70.  Diarrhea has resolved  Objective: Vitals:   02/04/22 2056 02/05/22 0104 02/05/22 0443 02/05/22 0747  BP: (!) 187/74 (!) 183/59 (!) 181/51 (!) 144/53  Pulse: (!) 59 (!) 56 (!) 57 (!) 47  Resp: '16 18 18 18  '$ Temp: 97.7 F (36.5 C) 97.6 F (36.4 C) 98 F (36.7 C) 97.6 F (36.4 C)  TempSrc:    Axillary  SpO2: 99% 98% 100% 97%  Weight:      Height:        Intake/Output Summary (Last 24 hours) at 02/05/2022 1153 Last data filed at 02/05/2022 0445 Gross per 24 hour  Intake 1200 ml  Output 1050 ml  Net 150 ml    Filed Weights   02/03/22 2105 02/04/22 0406  Weight: 77.1 kg 77 kg    Examination:  General exam: Overall comfortable, not in distress, very deconditioned elderly female HEENT: PERRL Respiratory system:  no wheezes or crackles  Cardiovascular system: S1, S2, sinus rhythm , fluctuating heart rate  gastrointestinal system: Abdomen is nondistended, soft and nontender. Central nervous system: Alert and, oriented to place only Extremities: No edema, no clubbing ,no cyanosis Skin: No rashes, no ulcers,no icterus     Data Reviewed: I have personally reviewed following labs and imaging studies  CBC: Recent Labs  Lab 02/03/22 2118 02/04/22 0237 02/05/22 0825  WBC 7.6 6.4 5.3  NEUTROABS  --   --  3.4  HGB 11.5* 10.8* 10.0*  HCT 36.5 35.1* 31.3*  MCV 90.6 91.6 87.7  PLT 301 284 502   Basic Metabolic Panel: Recent Labs  Lab 02/03/22 2118  02/04/22 0237 02/04/22 1022 02/05/22 0825  NA 143  --  145 143  K 4.7  --  4.2 3.8  CL 105  --  116* 115*  CO2 21*  --  20* 21*  GLUCOSE 153*  --  106* 132*  BUN 76*  --  72* 45*  CREATININE 2.15* 2.03* 1.78* 1.15*  CALCIUM 10.1  --  9.6 9.6  MG  --   --  2.5* 2.0     Recent Results (from the past 240 hour(s))  SARS Coronavirus 2 by RT PCR (hospital order, performed in [facility] hospital lab) *cepheid single result test* Anterior Nasal Swab     Status: Abnormal   Collection Time: 02/03/22  9:23 PM   Specimen: Anterior Nasal Swab  Result Value Ref Range Status   SARS Coronavirus 2 by RT PCR POSITIVE (A) NEGATIVE Final    Comment: (NOTE) SARS-CoV-2 target nucleic acids are DETECTED  SARS-CoV-2 RNA is generally detectable in upper respiratory specimens  during the acute phase of infection.  Positive results are indicative  of the presence of the identified virus, but do not rule out bacterial infection or co-infection with other pathogens not detected by the test.  Clinical correlation with patient history and  other diagnostic  information is necessary to determine patient infection status.  The expected result is negative.  Fact Sheet for Patients:   https://www.patel.info/   Fact Sheet for Healthcare Providers:   https://hall.com/    This test is not yet approved or cleared by the [place] FDA and  has been authorized for detection and/or diagnosis of SARS-CoV-2 by FDA under an Emergency Use Authorization (EUA).  This EUA will remain in effect (meaning this test can be used) for the duration of  the COVID-19 declaration under Section 564(b)(1)  of the Act, 21 U.S.C. section 360-bbb-3(b)(1), unless the authorization is terminated or revoked sooner.   Performed at [facility], [address], [place], [place] [address]   Urine Culture     Status: None (Preliminary result)   Collection Time: 02/03/22  9:24 PM   Specimen: Urine, Clean Catch  Result Value Ref Range Status   Specimen Description   Final    URINE, CLEAN CATCH Performed at [facility], [address] [place], [place] [address]    Special Requests   Final    NONE Performed at [facility], [address], [place], [place] [address]    Culture   Final    CULTURE REINCUBATED FOR BETTER GROWTH Performed at [facility], [place] [address] [place], [place] [address]    Report Status PENDING  Incomplete  Culture, blood (Routine X 2) w Reflex to ID Panel     Status: None (Preliminary result)   Collection Time: 02/04/22  2:37 AM   Specimen: BLOOD  Result Value Ref Range Status   Specimen Description BLOOD RIGHT ANTECUBITAL  Final   Special Requests   Final    BOTTLES DRAWN AEROBIC AND ANAEROBIC Blood Culture results may not be optimal due to an excessive volume of blood received in culture bottles   Culture   Final    NO GROWTH 1 DAY Performed at [facility], [address], [place], [place] [address]    Report Status PENDING  Incomplete   Culture, blood (Routine X 2) w Reflex to ID Panel     Status: None (Preliminary result)   Collection Time: 02/04/22  9:57 PM   Specimen: BLOOD  Result Value Ref Range Status   Specimen Description BLOOD RIGHT ASSIST CONTROL  Final   Special Requests   Final    BOTTLES DRAWN AEROBIC AND ANAEROBIC Blood Culture results may not be optimal due to an excessive volume of blood received in culture bottles   Culture   Final    NO GROWTH < 12 HOURS Performed at [facility], [address], [place], [place] [address]    Report Status PENDING  Incomplete     Radiology Studies: CT ABDOMEN PELVIS WO CONTRAST  Result Date: 02/03/2022 CLINICAL DATA:  Abdominal pain, acute, nonlocalized EXAM: CT ABDOMEN AND PELVIS WITHOUT CONTRAST TECHNIQUE: Multidetector CT imaging of the abdomen and pelvis was performed following the standard protocol without IV contrast. RADIATION DOSE REDUCTION: This exam was performed according to the departmental dose-optimization program which includes automated exposure control, adjustment of the mA and/or kV according to patient size and/or use of iterative reconstruction technique. COMPARISON:  None Available. FINDINGS: Lower chest: Moderate right coronary artery calcification. Global cardiac size within normal limits. No acute abnormality. Hepatobiliary: Cholelithiasis without pericholecystic inflammatory change. Liver unremarkable on this noncontrast examination. No intra or extrahepatic biliary ductal dilation. Pancreas: Unremarkable Spleen: Unremarkable Adrenals/Urinary Tract: Adrenal glands are unremarkable. Kidneys are normal, without renal calculi, focal lesion, or hydronephrosis. Bladder is unremarkable. Stomach/Bowel: Severe distal colonic diverticulosis involving the distal transverse, descending, and sigmoid colon. No superimposed acute inflammatory change. Stomach, small bowel, and large bowel are unremarkable. Appendix normal. No free intraperitoneal gas or fluid.  Vascular/Lymphatic: Extensive aortoiliac atherosclerotic calcification. No aortic aneurysm. Particularly prominent atherosclerotic calcification involving the mesenteric and renal artery origins, however, the degree of stenosis is not well assessed on this noncontrast examination. No pathologic adenopathy within the abdomen and pelvis. Reproductive: Status post hysterectomy. No adnexal masses. Other: No abdominal wall hernia.  Rectum unremarkable. Musculoskeletal: Remote inferior endplate fracture V37 with no significant loss of height. No acute bone abnormality. Degenerative changes are seen within the lumbar spine. No lytic or blastic bone lesion. IMPRESSION: 1. No acute intra-abdominal pathology identified. No definite radiographic explanation for the patient's reported symptoms. 2. Moderate right coronary artery calcification. 3. Cholelithiasis. 4. Severe distal colonic diverticulosis without superimposed acute inflammatory change. 5. Peripheral vascular disease. Suspected hemodynamically significant mesenteric and renal artery stenoses. If there is clinical evidence of chronic mesenteric ischemia or hemodynamically significant renal artery stenosis, CT arteriography may be more helpful for further evaluation. Aortic Atherosclerosis (ICD10-I70.0). Electronically Signed   By: Fidela Salisbury M.D.   On: 02/03/2022 23:26   CT HEAD WO CONTRAST (5MM)  Result Date: 02/03/2022 CLINICAL DATA:  Head trauma, minor (Age >= 65y). Altered mental status. EXAM: CT HEAD WITHOUT CONTRAST TECHNIQUE: Contiguous axial images were obtained from the base of the skull through the vertex without intravenous contrast. RADIATION DOSE REDUCTION: This exam was performed according to the departmental dose-optimization program which includes automated exposure control, adjustment of the mA and/or kV according to patient size and/or use of iterative reconstruction technique. COMPARISON:  None Available. FINDINGS: Brain: Normal anatomic  configuration. Parenchymal volume loss is commensurate with the patient's age. Mild periventricular white matter changes are present likely reflecting the sequela of small vessel ischemia. Remote lacunar infarct within the right thalamus and left globus pallidus. No abnormal intra or extra-axial mass lesion or fluid collection. No abnormal mass effect or midline shift. No evidence of acute intracranial hemorrhage or infarct. Ventricular size is normal. Cerebellum unremarkable. Vascular: No asymmetric hyperdense vasculature at the skull base. Extensive atherosclerotic  calcification within the carotid siphons. Skull: Intact Sinuses/Orbits: Paranasal sinuses are clear. Orbits are unremarkable. Other: Left mastoidectomy has been performed. Additionally, the auditory ossicles are absent suggesting prior resection. Debris or packing material is noted within the resection bed. Right mastoid air cells and middle ear cavity is clear. IMPRESSION: 1. No acute intracranial abnormality. 2. Mild senescent change and remote lacunar infarcts. 3. Postsurgical changes involving the left mastoid air cells and middle ear cavity. Debris versus residual packing material within the a resection bed. Electronically Signed   By: Fidela Salisbury M.D.   On: 02/03/2022 23:17   DG Chest Portable 1 View  Result Date: 02/03/2022 CLINICAL DATA:  Sob.  Recent covid. Cough, sob, nausea, vomiting. EXAM: PORTABLE CHEST 1 VIEW COMPARISON:  Chest x-ray 06/10/2021 FINDINGS: The heart and mediastinal contours are unchanged. Aortic calcification. No focal consolidation. No pulmonary edema. No pleural effusion. No pneumothorax. No acute osseous abnormality. Right shoulder rotator cuff anchor suture. IMPRESSION: 1. No active disease. 2.  Aortic Atherosclerosis (ICD10-I70.0). Electronically Signed   By: Iven Finn M.D.   On: 02/03/2022 22:45    Scheduled Meds:  ascorbic acid  500 mg Oral Daily   heparin  5,000 Units Subcutaneous Q8H   hydrALAZINE   25 mg Oral BID   insulin aspart  0-9 Units Subcutaneous TID WC   molnupiravir EUA  4 capsule Oral BID   multivitamin with minerals  1 tablet Oral Daily   zinc sulfate  220 mg Oral Daily   Continuous Infusions:  sodium chloride 100 mL/hr at 02/04/22 0915   cefTRIAXone (ROCEPHIN)  IV       LOS: 1 day   Shelly Coss, MD [facility] P9/10/2021, 11:53 AM

## 2022-02-06 DIAGNOSIS — U071 COVID-19: Secondary | ICD-10-CM | POA: Diagnosis not present

## 2022-02-06 DIAGNOSIS — L8990 Pressure ulcer of unspecified site, unspecified stage: Secondary | ICD-10-CM | POA: Insufficient documentation

## 2022-02-06 LAB — CBC WITH DIFFERENTIAL/PLATELET
Abs Immature Granulocytes: 0.02 10*3/uL (ref 0.00–0.07)
Basophils Absolute: 0 10*3/uL (ref 0.0–0.1)
Basophils Relative: 0 %
Eosinophils Absolute: 0.1 10*3/uL (ref 0.0–0.5)
Eosinophils Relative: 2 %
HCT: 30.9 % — ABNORMAL LOW (ref 36.0–46.0)
Hemoglobin: 10 g/dL — ABNORMAL LOW (ref 12.0–15.0)
Immature Granulocytes: 0 %
Lymphocytes Relative: 27 %
Lymphs Abs: 1.4 10*3/uL (ref 0.7–4.0)
MCH: 28.2 pg (ref 26.0–34.0)
MCHC: 32.4 g/dL (ref 30.0–36.0)
MCV: 87.3 fL (ref 80.0–100.0)
Monocytes Absolute: 0.5 10*3/uL (ref 0.1–1.0)
Monocytes Relative: 9 %
Neutro Abs: 3 10*3/uL (ref 1.7–7.7)
Neutrophils Relative %: 62 %
Platelets: 234 10*3/uL (ref 150–400)
RBC: 3.54 MIL/uL — ABNORMAL LOW (ref 3.87–5.11)
RDW: 12.9 % (ref 11.5–15.5)
WBC: 5 10*3/uL (ref 4.0–10.5)
nRBC: 0 % (ref 0.0–0.2)

## 2022-02-06 LAB — COMPREHENSIVE METABOLIC PANEL
ALT: 9 U/L (ref 0–44)
AST: 24 U/L (ref 15–41)
Albumin: 3.2 g/dL — ABNORMAL LOW (ref 3.5–5.0)
Alkaline Phosphatase: 51 U/L (ref 38–126)
Anion gap: 5 (ref 5–15)
BUN: 40 mg/dL — ABNORMAL HIGH (ref 8–23)
CO2: 23 mmol/L (ref 22–32)
Calcium: 9.2 mg/dL (ref 8.9–10.3)
Chloride: 113 mmol/L — ABNORMAL HIGH (ref 98–111)
Creatinine, Ser: 1.13 mg/dL — ABNORMAL HIGH (ref 0.44–1.00)
GFR, Estimated: 47 mL/min — ABNORMAL LOW (ref >60)
Glucose, Bld: 109 mg/dL — ABNORMAL HIGH (ref 70–99)
Potassium: 4.1 mmol/L (ref 3.5–5.1)
Sodium: 141 mmol/L (ref 135–145)
Total Bilirubin: 0.6 mg/dL (ref 0.3–1.2)
Total Protein: 6.3 g/dL — ABNORMAL LOW (ref 6.5–8.1)

## 2022-02-06 LAB — GLUCOSE, CAPILLARY
Glucose-Capillary: 117 mg/dL — ABNORMAL HIGH (ref 70–99)
Glucose-Capillary: 156 mg/dL — ABNORMAL HIGH (ref 70–99)
Glucose-Capillary: 144 mg/dL — ABNORMAL HIGH (ref 70–99)
Glucose-Capillary: 165 mg/dL — ABNORMAL HIGH (ref 70–99)
Glucose-Capillary: 164 mg/dL — ABNORMAL HIGH (ref 70–99)

## 2022-02-06 LAB — FERRITIN: Ferritin: 491 ng/mL — ABNORMAL HIGH (ref 11–307)

## 2022-02-06 LAB — MAGNESIUM: Magnesium: 1.9 mg/dL (ref 1.7–2.4)

## 2022-02-06 LAB — C-REACTIVE PROTEIN: CRP: 0.9 mg/dL (ref <1.0)

## 2022-02-06 LAB — D-DIMER, QUANTITATIVE: D-Dimer, Quant: 0.74 ug/mL-FEU — ABNORMAL HIGH (ref 0.00–0.50)

## 2022-02-06 MED ORDER — MIRABEGRON ER 50 MG PO TB24
50.0000 mg | ORAL_TABLET | Freq: Every day | ORAL | Status: DC
  Administered 2022-02-06 – 2022-02-15 (×9): 50 mg via ORAL
  Filled 2022-02-06 (×10): qty 1

## 2022-02-06 MED ORDER — VITAMIN B-12 1000 MCG PO TABS
1000.0000 ug | ORAL_TABLET | Freq: Every day | ORAL | Status: DC
  Administered 2022-02-06 – 2022-02-15 (×9): 1000 ug via ORAL
  Filled 2022-02-06 (×9): qty 1

## 2022-02-06 MED ORDER — ALUM & MAG HYDROXIDE-SIMETH 200-200-20 MG/5ML PO SUSP: 30.0000 mL | Freq: Four times a day (QID) | ORAL | Status: DC | PRN

## 2022-02-06 MED ORDER — SENNA 8.6 MG PO TABS: 1.0000 | ORAL_TABLET | Freq: Every day | ORAL | Status: DC | PRN

## 2022-02-06 MED ORDER — LATANOPROST 0.005 % OP SOLN
1.0000 [drp] | Freq: Every day | OPHTHALMIC | Status: DC
Start: 2022-02-06 — End: 2022-02-15
  Administered 2022-02-06 – 2022-02-14 (×9): 1 [drp] via OPHTHALMIC
  Filled 2022-02-06: qty 2.5

## 2022-02-06 MED ORDER — BRIMONIDINE TARTRATE 0.2 % OP SOLN
1.0000 [drp] | Freq: Two times a day (BID) | OPHTHALMIC | Status: DC
  Administered 2022-02-06 – 2022-02-15 (×17): 1 [drp] via OPHTHALMIC
  Filled 2022-02-06: qty 5

## 2022-02-06 MED ORDER — DORZOLAMIDE HCL-TIMOLOL MAL 2-0.5 % OP SOLN
1.0000 [drp] | Freq: Two times a day (BID) | OPHTHALMIC | Status: DC
  Administered 2022-02-06 – 2022-02-15 (×17): 1 [drp] via OPHTHALMIC
  Filled 2022-02-06: qty 10

## 2022-02-06 MED ORDER — LORATADINE 10 MG PO TABS
10.0000 mg | ORAL_TABLET | Freq: Every day | ORAL | Status: DC
  Administered 2022-02-06 – 2022-02-15 (×9): 10 mg via ORAL
  Filled 2022-02-06 (×9): qty 1

## 2022-02-06 NOTE — Progress Notes (Signed)
Physical Therapy Treatment Patient Details Name: Martha Walker MRN: [id number] DOB: [birth year] Today's Date: 02/06/2022   History of Present Illness 88 year old female with history of carotid stenosis, CKD stage IIIa, diabetes type 2, hyperlipidemia, hypertension, peripheral vascular disease who presented from ALF with complaint of progressive weakness, nausea, vomiting, diarrhea, confusion.    PT Comments    The patient was seated in the recliner chair and agreeable to PT session. She continues to require physical assistance for transfers and short distance ambulation with rolling walker. She is fatigued with activity with heart rate up to 105bpm with activity. Recommend to continue PT to maximize independence and facilitate return to prior level of function. SNF is sill appropriate discharge plan at this time.   Recommendations for follow up therapy are one component of a multi-disciplinary discharge planning process, led by the attending physician.  Recommendations may be updated based on patient status, additional functional criteria and insurance authorization.  Follow Up Recommendations  Skilled nursing-short term rehab (<3 hours/day) Can patient physically be transported by private vehicle: No   Assistance Recommended at Discharge Frequent or constant Supervision/Assistance  Patient can return home with the following A little help with bathing/dressing/bathroom;Assistance with cooking/housework;Direct supervision/assist for medications management;Assist for transportation   Equipment Recommendations  Rolling walker (2 wheels)    Recommendations for Other Services       Precautions / Restrictions Precautions Precautions: Fall Restrictions Weight Bearing Restrictions: No     Mobility  Bed Mobility               General bed mobility comments: not assessed as patient sitting up on arrival and post session    Transfers Overall transfer level: Needs assistance Equipment  used: Rolling walker (2 wheels) Transfers: Sit to/from Stand Sit to Stand: Min assist           General transfer comment: lifting assistance required for standing. verbal cues for technique    Ambulation/Gait Ambulation/Gait assistance: Min assist Gait Distance (Feet): 45 Feet Assistive device: Rolling walker (2 wheels) Gait Pattern/deviations: Decreased stride length, Narrow base of support Gait velocity: decreased     General Gait Details: verbal cues for technique for standing closer to rolling walker to avoid trunk flexion. heart rate up to 105bpm with ambulation. patient declined further ambulation due to fatigue   Stairs             Wheelchair Mobility    Modified Rankin (Stroke Patients Only)       Balance Overall balance assessment: Needs assistance Sitting-balance support: Bilateral upper extremity supported, Single extremity supported, Feet supported Sitting balance-Leahy Scale: Good     Standing balance support: Single extremity supported, During functional activity Standing balance-Leahy Scale: Fair Standing balance comment: with RW for support in standing                            Cognition Arousal/Alertness: Awake/alert Behavior During Therapy: Flat affect Overall Cognitive Status: No family/caregiver present to determine baseline cognitive functioning Area of Impairment: Orientation, Memory, Following commands, Safety/judgement, Problem solving                 Orientation Level: Disoriented to, Situation, Place   Memory: Decreased short-term memory Following Commands: Follows one step commands with increased time Safety/Judgement: Decreased awareness of safety, Decreased awareness of deficits   Problem Solving: Slow processing, Requires verbal cues, Requires tactile cues          Exercises  General Comments General comments (skin integrity, edema, etc.): HR up to 112 with mobility, spo2 >90% on RA throughout       Pertinent Vitals/Pain Pain Assessment Pain Assessment: No/denies pain    Home Living                          Prior Function            PT Goals (current goals can now be found in the care plan section) Acute Rehab PT Goals Patient Stated Goal: to increase independence PT Goal Formulation: With patient Time For Goal Achievement: 02/18/22 Potential to Achieve Goals: Fair Progress towards PT goals: Progressing toward goals    Frequency    Min 2X/week      PT Plan Current plan remains appropriate    Co-evaluation              AM-PAC PT "6 Clicks" Mobility   Outcome Measure  Help needed turning from your back to your side while in a flat bed without using bedrails?: A Little Help needed moving from lying on your back to sitting on the side of a flat bed without using bedrails?: A Lot Help needed moving to and from a bed to a chair (including a wheelchair)?: A Little Help needed standing up from a chair using your arms (e.g., wheelchair or bedside chair)?: A Little Help needed to walk in hospital room?: A Little Help needed climbing 3-5 steps with a railing? : A Lot 6 Click Score: 16    End of Session Equipment Utilized During Treatment: Gait belt Activity Tolerance: Patient limited by fatigue;Patient tolerated treatment well Patient left: in chair;with call bell/phone within reach;with chair alarm set Nurse Communication: Mobility status PT Visit Diagnosis: Unsteadiness on feet (R26.81);Muscle weakness (generalized) (M62.81);Difficulty in walking, not elsewhere classified (R26.2)     Time: 1025-1040 PT Time Calculation (min) (ACUTE ONLY): 15 min  Charges:  $Gait Training: 8-22 mins                     Minna Merritts, PT, MPT    Percell Locus 02/06/2022, 11:04 AM

## 2022-02-06 NOTE — Progress Notes (Signed)
Occupational Therapy Treatment Patient Details Name: Martha Walker MRN: [id number] DOB: Oct 25, 1931 Today's Date: 02/06/2022   History of present illness 88 year old female with history of carotid stenosis, CKD stage IIIa, diabetes type 2, hyperlipidemia, hypertension, peripheral vascular disease who presented from ALF with complaint of progressive weakness, nausea, vomiting, diarrhea, confusion.   OT comments  Chart reviewed, RN cleared pt for participation. Pt is oriented to self only. Tx session targeted improving activity tolerance for functional ADL tasks. Pt required MOD A for bed mobility, improvements noted in functional amb with pt requiring supervision-CGA for amb approx 20' in room with RW. STS completed with MIN A from raised height, MAX A required for peri care, LB dressing. MIN A required for UB dressing/bathing. Pt is left in bedside chair, all needs met. OT will continue to follow acutely.    Recommendations for follow up therapy are one component of a multi-disciplinary discharge planning process, led by the attending physician.  Recommendations may be updated based on patient status, additional functional criteria and insurance authorization.    Follow Up Recommendations  Skilled nursing-short term rehab (<3 hours/day)    Assistance Recommended at Discharge Frequent or constant Supervision/Assistance  Patient can return home with the following  A little help with walking and/or transfers;A lot of help with bathing/dressing/bathroom;Assistance with cooking/housework;Assist for transportation;Help with stairs or ramp for entrance   Equipment Recommendations  BSC/3in1;Other (comment) (2WW)    Recommendations for Other Services      Precautions / Restrictions Precautions Precautions: Fall Restrictions Weight Bearing Restrictions: No       Mobility Bed Mobility Overal bed mobility: Needs Assistance Bed Mobility: Supine to Sit     Supine to sit: Mod assist, HOB  elevated          Transfers Overall transfer level: Needs assistance Equipment used: Rolling walker (2 wheels) Transfers: Sit to/from Stand Sit to Stand: Min assist, From elevated surface                 Balance Overall balance assessment: Needs assistance Sitting-balance support: Bilateral upper extremity supported, Single extremity supported, Feet supported Sitting balance-Leahy Scale: Good     Standing balance support: Single extremity supported, During functional activity                               ADL either performed or assessed with clinical judgement   ADL Overall ADL's : Needs assistance/impaired Eating/Feeding: Set up;Sitting   Grooming: Wash/dry hands;Sitting;Set up   Upper Body Bathing: Minimal assistance;Sitting;Cueing for sequencing   Lower Body Bathing: Moderate assistance;Maximal assistance;Sit to/from stand;Cueing for sequencing   Upper Body Dressing : Minimal assistance;Sitting   Lower Body Dressing: Maximal assistance;Sit to/from stand Lower Body Dressing Details (indicate cue type and reason): underwear Toilet Transfer: Supervision/safety;Min guard;Ambulation;Rolling walker (2 wheels) Toilet Transfer Details (indicate cue type and reason): simulated to bedside chair, amb in room approx 20' Toileting- Clothing Manipulation and Hygiene: Maximal assistance;Sit to/from stand       Functional mobility during ADLs: Supervision/safety;Min guard;Rolling walker (2 wheels) (approx20' in room)      Extremity/Trunk Assessment              Vision       Perception     Praxis      Cognition Arousal/Alertness: Awake/alert Behavior During Therapy: Flat affect Overall Cognitive Status: No family/caregiver present to determine baseline cognitive functioning Area of Impairment: Orientation, Attention, Memory, Following commands, Safety/judgement, Awareness,  Problem solving                 Orientation Level: Disoriented to,  Place, Time, Situation Current Attention Level: Focused Memory: Decreased short-term memory Following Commands: Follows one step commands with increased time Safety/Judgement: Decreased awareness of safety, Decreased awareness of deficits Awareness: Emergent Problem Solving: Slow processing, Requires verbal cues, Requires tactile cues          Exercises      Shoulder Instructions       General Comments HR up to 112 with mobility, spo2 >90% on RA throughout    Pertinent Vitals/ Pain       Pain Assessment Pain Assessment: No/denies pain  Home Living                                          Prior Functioning/Environment              Frequency  Min 2X/week        Progress Toward Goals  OT Goals(current goals can now be found in the care plan section)  Progress towards OT goals: Progressing toward goals     Plan Discharge plan remains appropriate    Co-evaluation                 AM-PAC OT "6 Clicks" Daily Activity     Outcome Measure   Help from another person eating meals?: None Help from another person taking care of personal grooming?: A Little Help from another person toileting, which includes using toliet, bedpan, or urinal?: A Little Help from another person bathing (including washing, rinsing, drying)?: A Little Help from another person to put on and taking off regular upper body clothing?: A Little Help from another person to put on and taking off regular lower body clothing?: A Little 6 Click Score: 19    End of Session Equipment Utilized During Treatment: Rolling walker (2 wheels)  OT Visit Diagnosis: Other abnormalities of gait and mobility (R26.89);Muscle weakness (generalized) (M62.81)   Activity Tolerance Patient tolerated treatment well   Patient Left in chair;with call bell/phone within reach;with chair alarm set   Nurse Communication Mobility status        Time: 3291-9166 OT Time Calculation (min): 23  min  Charges: OT General Charges $OT Visit: 1 Visit OT Treatments $Self Care/Home Management : 23-37 mins  Shanon Payor, OTD OTR/L  02/06/22, 11:32 AM

## 2022-02-06 NOTE — Progress Notes (Signed)
PROGRESS NOTE  Martha Walker  VOH:[id number] DOB: 05/24/1932 DOA: 02/03/2022 PCP: Leonel Ramsay, MD   Brief Narrative: Patient is a 88 year old female with history of carotid stenosis, CKD stage IIIa, diabetes type 2, hyperlipidemia, hypertension, peripheral vascular disease who presented from ALF with complaint of progressive weakness, nausea, vomiting, diarrhea, confusion.  On presentation she was hemodynamically stable, on room air.  Lab work showed creatinine of 2.15, baseline creatinine around 1.3.  COVID screen test positive.  UA showed significant leukocytes, UTI suspected, started on ceftriaxone, urine culture sent.  Hospital course remarkable for intermittent  bradycardia.  Cardiology consulted, no plan for intervention.  PT/OT consulted for weakness.  Assessment & Plan:  Principal Problem:   COVID-19 Active Problems:   Pressure injury of skin  COVID infection: Presented with nausea, vomiting, diarrhea, weakness. Suspected  COVID gastroenteritis.  The symptoms have resolved now.  Continue molnupiravir.   UTI: UA was suggestive  of UTI with some few leukocytes.  Urine culture sent,pending.  Start on ceftriaxone.  Unclear if patient has dysuria   Bradycardia: She is mostly asymptomatic.  Patient heart rate runs low to 40.  Heart rate fluctuating between 40-70.  There was also report of significant pauses of about 2 seconds.  Reviewed her previous records, on her last admission,she was seen by Dr. 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  Cardiology consulted, recommending conservative management.  Monitor on telemetry.  Avoid AV nodal blocker agents.  Electrolytes stable   Confusion/Dementia : Likely from metabolic encephalopathy from UTI, COVID.  As per the daughter, she is intermittently confused, she might have underlying dementia. Takes memantine. continue supportive care, frequent reorientation, delirium precautions.  CT head showed remote lacunar infarcts,  no acute findings   AKI in CKD stage IIIa: Baseline creatinine 1.3.  Presented with creatinine in the range of 2.  Now back to baseline with IV fluid, IV fluid discontinued   Bilateral carotid stenosis: On Plavix, statin   Diabetes type 2: On metformin at home.  Continue sliding scale insulin.  Blood sugars   Hyperlipidemia: On statin at home   Hypertension: Currently blood pressure stable.  Hydralazine resumed.  ARB on hold due to AKI   Peripheral vascular disease: CT abdomen/pelvis showed hemodynamically significant mesenteric and renal artery stenoses.  Further work-up as an outpatient, cannot do contrast studies like CT angiogram due  her kidney function.  Questionable significance of further work-up due to her advanced age  Weakness/disposition: Patient is from ALF.  PT/OT consulted, SNF recommended.  TOC following   Stage II sacral wound - POA.  I agree with the wound description as outlined below. Continue diligent wound care, frequent repositioning and monitor site closely for signs of infection.   Pressure Injury 02/04/22 Sacrum Medial Stage 2 -  Partial thickness loss of dermis presenting as a shallow open injury with a red, pink wound bed without slough. 3x3 open area to skin (Active)  02/04/22 0430  Location: Sacrum  Location Orientation: Medial  Staging: Stage 2 -  Partial thickness loss of dermis presenting as a shallow open injury with a red, pink wound bed without slough.  Wound Description (Comments): 3x3 open area to skin  Present on Admission: Yes  Dressing Type Foam - Lift dressing to assess site every shift 02/06/22 1037    DVT prophylaxis:heparin injection 5,000 Units Start: 02/04/22 0600     Code Status: Full Code  Family Communication: Daughter at bedside  Patient status: Inpatient  Patient is  from : ALF  Anticipated discharge to: SNF  Estimated DC date: Requires 10-day isolation for COVID-positive   Consultants:  Cardiology  Procedures:None  Antimicrobials:     Subjective: Patient sitting up in recliner, daughter at bedside when seen on rounds this morning.  Patient reports feeling better and has no acute complaints at this time including any dizziness or lightheadedness.  Objective: Vitals:   02/06/22 0047 02/06/22 0431 02/06/22 0811 02/06/22 1237  BP: 136/62 (!) 142/61 (!) 117/54 136/69  Pulse:   (!) 44 84  Resp:   16 18  Temp: 98.1 F (36.7 C) 98 F (36.7 C) (!) 97.5 F (36.4 C) 98.6 F (37 C)  TempSrc: Oral Oral    SpO2: 100% 100% 100% 100%  Weight:      Height:       Filed Weights   02/03/22 2105 02/04/22 0406  Weight: 77.1 kg 77 kg    Examination:  General exam: awake, alert, no acute distress HEENT: moist mucus membranes, hearing grossly normal  Respiratory system: On room air, normal respiratory effort. Cardiovascular system: normal S1/S2, bradycardic, no pedal edema.   Central nervous system: A&O x. no gross focal neurologic deficits, normal speech Extremities: moves all, no edema, normal tone Skin: dry, intact, normal temperature Psychiatry: normal mood, congruent affect   Data Reviewed:    Labs notable for --- chloride 113, glucose 109, BUN 40, creatinine 1.13, albumin 3.2, ferritin 491 from 377.  CRP is normal 0.9.  Hemoglobin stable at 10.0.  D-dimer 0.74 from 0.79.  Blood culture negative x2 days.  CBGs at goal.         LOS: 2 days   Ezekiel Slocumb, DO [facility] P9/11/2021, 4:21 PM

## 2022-02-06 NOTE — TOC Initial Note (Signed)
Transition of Care [facility]) - Initial/Assessment Note    Patient Details  Name: Martha Walker MRN: [id number] Date of Birth: 20-Sep-1931  [facility]) CM/SW Contact:    Laurena Slimmer, RN Phone Number: 02/06/2022, 12:11 AM  Clinical Narrative:                 Per PT/ OT eval SNF recommended. FL2 completed. Bed search initiated.         Patient Goals and CMS Choice        Expected Discharge Plan and Services                                                Prior Living Arrangements/Services                       Activities of Daily Living Home Assistive Devices/Equipment: Gilford Rile (specify type) ADL Screening (condition at time of admission) Patient's cognitive ability adequate to safely complete daily activities?: Yes Is the patient deaf or have difficulty hearing?: Yes Does the patient have difficulty seeing, even when wearing glasses/contacts?: No Does the patient have difficulty concentrating, remembering, or making decisions?: No Patient able to express need for assistance with ADLs?: Yes Does the patient have difficulty dressing or bathing?: No Independently performs ADLs?: Yes (appropriate for developmental age) Does the patient have difficulty walking or climbing stairs?: Yes Weakness of Legs: Both Weakness of Arms/Hands: None  Permission Sought/Granted                  Emotional Assessment              Admission diagnosis:  Acute cystitis without hematuria [N30.00] AKI (acute kidney injury) ([place]) [N17.9] Altered mental status, unspecified altered mental status type [R41.82] COVID-19 [U07.1] Patient Active Problem List   Diagnosis Date Noted   COVID-19 02/04/2022   Bradycardia 05/10/2019   Chronic anemia    Chest pain 05/09/2019   PCP:  Leonel Ramsay, MD Pharmacy:   [facility] [place], [place] [address] Phone: [phone] Fax:  [phone]  [facility] [id number] [place], [place] [facility] AT [address] [place] [address] Phone: [phone] Fax: [phone]     Social Determinants of Health (SDOH) Interventions    Readmission Risk Interventions     No data to display

## 2022-02-06 NOTE — NC FL2 (Signed)
[place] LEVEL OF CARE SCREENING TOOL     IDENTIFICATION  Patient Name: Martha Walker Birthdate: 1931/12/10 Sex: female Admission Date (Current Location): 02/03/2022  [facility] and [place] Number:  Engineering geologist and Address:  [facility], [address], [place], [place] [address]      Provider Number: [id number]  Attending Physician Name and Address:  Shelly Coss, MD  Relative Name and Phone Number:  Manuela Schwartz [place],[phone]    Current Level of Care: Hospital Recommended Level of Care: [place] Prior Approval Number:    Date Approved/Denied:   PASRR Number: [id number]  Discharge Plan: SNF    Current Diagnoses: Patient Active Problem List   Diagnosis Date Noted   COVID-19 02/04/2022   Bradycardia 05/10/2019   Chronic anemia    Chest pain 05/09/2019    Orientation RESPIRATION BLADDER Height & Weight     Self  Normal External catheter Weight: 77 kg Height:  '5\' 4"'$  (162.6 cm)  BEHAVIORAL SYMPTOMS/MOOD NEUROLOGICAL BOWEL NUTRITION STATUS  Other (Comment) (n/a)   Continent Diet (Heart)  AMBULATORY STATUS COMMUNICATION OF NEEDS Skin   Limited Assist Verbally Normal                       Personal Care Assistance Level of Assistance  Bathing, Dressing Bathing Assistance: Limited assistance   Dressing Assistance: Limited assistance     Functional Limitations Info  Sight, Hearing   Hearing Info: Impaired      SPECIAL CARE FACTORS FREQUENCY  PT (By licensed PT), OT (By licensed OT)     PT Frequency: 2x weekly min OT Frequency: 2xweekly min            Contractures Contractures Info: Not present    Additional Factors Info  Code Status, Allergies Code Status Info: FULL Allergies Info: Amlodipine, Amlodipine Besy-benazepril Hcl, Atorvastatin, Augmentin (Amoxicillin-pot Clavulanate), Bactrim (Sulfamethoxazole-trimethoprim), Etodolac, Fosinopril, Ibudone (Hydrocodone-ibuprofen),  Januvia (Sitagliptin), Lipitor (Atorvastatin Calcium), Metronidazole, Mobic (Meloxicam), Penicillins, Pravastatin, Simvastatin, Vicodin (Hydrocodone-acetaminophen), Tylenol (Acetaminophen)           Current Medications (02/06/2022):  This is the current hospital active medication list Current Facility-Administered Medications  Medication Dose Route Frequency Provider Last Rate Last Admin   acetaminophen (TYLENOL) tablet 650 mg  650 mg Oral Q6H PRN Shelly Coss, MD   650 mg at 02/04/22 1034   ascorbic acid (VITAMIN C) tablet 500 mg  500 mg Oral Daily Myles Rosenthal A, MD   500 mg at 02/05/22 0944   heparin injection 5,000 Units  5,000 Units Subcutaneous Q8H Myles Rosenthal A, MD   5,000 Units at 02/05/22 2300   hydrALAZINE (APRESOLINE) tablet 25 mg  25 mg Oral BID Shelly Coss, MD   25 mg at 02/05/22 2301   insulin aspart (novoLOG) injection 0-9 Units  0-9 Units Subcutaneous TID WC Clance Boll, MD   2 Units at 02/05/22 1702   memantine (NAMENDA) tablet 5 mg  5 mg Oral BID Shelly Coss, MD   5 mg at 02/05/22 2302   molnupiravir EUA (LAGEVRIO) capsule 800 mg  4 capsule Oral BID Shelly Coss, MD   800 mg at 02/05/22 2259   multivitamin with minerals tablet 1 tablet  1 tablet Oral Daily Myles Rosenthal A, MD   1 tablet at 02/05/22 0944   ondansetron (ZOFRAN) tablet 4 mg  4 mg Oral Q6H PRN Clance Boll, MD       Or   ondansetron [facility]) injection 4  mg  4 mg Intravenous Q6H PRN Myles Rosenthal A, MD       oxyCODONE (Oxy IR/ROXICODONE) immediate release tablet 5 mg  5 mg Oral Q6H PRN Shelly Coss, MD   5 mg at 02/05/22 3818   zinc sulfate capsule 220 mg  220 mg Oral Daily Clance Boll, MD   220 mg at 02/05/22 4037     Discharge Medications: Please see discharge summary for a list of discharge medications.  Relevant Imaging Results:  Relevant Lab Results:   Additional Information SS# [id number]  Laurena Slimmer, RN

## 2022-02-07 DIAGNOSIS — U071 COVID-19: Secondary | ICD-10-CM | POA: Diagnosis not present

## 2022-02-07 LAB — COMPREHENSIVE METABOLIC PANEL
ALT: 10 U/L (ref 0–44)
AST: 25 U/L (ref 15–41)
Albumin: 3.1 g/dL — ABNORMAL LOW (ref 3.5–5.0)
Alkaline Phosphatase: 51 U/L (ref 38–126)
Anion gap: 6 (ref 5–15)
BUN: 43 mg/dL — ABNORMAL HIGH (ref 8–23)
CO2: 22 mmol/L (ref 22–32)
Calcium: 9.5 mg/dL (ref 8.9–10.3)
Chloride: 111 mmol/L (ref 98–111)
Creatinine, Ser: 1.24 mg/dL — ABNORMAL HIGH (ref 0.44–1.00)
GFR, Estimated: 42 mL/min — ABNORMAL LOW (ref >60)
Glucose, Bld: 129 mg/dL — ABNORMAL HIGH (ref 70–99)
Potassium: 4 mmol/L (ref 3.5–5.1)
Sodium: 139 mmol/L (ref 135–145)
Total Bilirubin: 0.4 mg/dL (ref 0.3–1.2)
Total Protein: 6 g/dL — ABNORMAL LOW (ref 6.5–8.1)

## 2022-02-07 LAB — GLUCOSE, CAPILLARY
Glucose-Capillary: 126 mg/dL — ABNORMAL HIGH (ref 70–99)
Glucose-Capillary: 201 mg/dL — ABNORMAL HIGH (ref 70–99)
Glucose-Capillary: 129 mg/dL — ABNORMAL HIGH (ref 70–99)
Glucose-Capillary: 119 mg/dL — ABNORMAL HIGH (ref 70–99)

## 2022-02-07 MED ORDER — SODIUM CHLORIDE 0.9 % IV BOLUS
250.0000 mL | Freq: Once | INTRAVENOUS | Status: AC
  Administered 2022-02-07: 250 mL via INTRAVENOUS

## 2022-02-07 NOTE — Progress Notes (Signed)
Minky with telemetry called again to report another pause for 3.93. Made MD aware and MD added cardiology. No new orders at this time.

## 2022-02-07 NOTE — TOC Progression Note (Signed)
Transition of Care [facility]) - Progression Note    Patient Details  Name: Martha Walker MRN: [id number] Date of Birth: [birth year]  Transition of Care [facility]) CM/SW Contact  Laurena Slimmer, RN Phone Number: 02/07/2022, 11:02 PM  Clinical Narrative:    Spoke with patient's daughter, Manuela Schwartz regarding discharge plan. Patient's daughter inquired about LTC. [place] Single patient does not currently have LTC benefit and to consult local DSS department for more information. Manuela Schwartz also informed most SNF required 10 waiting period for admission after a positive COVID results. Manuela Schwartz provided with current bed offers. She request for bed offers to be extended to [facility]   Bed search extended to [facility].        Expected Discharge Plan and Services                                                 Social Determinants of Health (SDOH) Interventions    Readmission Risk Interventions     No data to display

## 2022-02-07 NOTE — Progress Notes (Addendum)
[place] NOTE       Patient ID: Martha Walker MRN: [id number] DOB/AGE: [id number] 86 y.o.  Admit date: 02/03/2022 Referring Physician Dr. Tawanna Solo Primary Physician Dr. Ola Spurr  Primary Cardiologist Dr. Nehemiah Massed  Reason for Consultation bradycardia   HPI: Martha Walker is an 88yoF with a PMH of dementia, CKD 3, type 2 diabetes, hyperlipidemia, hypertension, carotid artery stenosis who presented to [facility] ED from her assisted living facility on 02/03/2022 with progressive weakness, N/V/D, and confusion above her baseline.  She was positive for COVID-19 on admission with a revealed significant leukocytes for which acute cystitis was suspected.  During her hospitalization she has been bradycardic with heart rate between 30s - 60s for which cardiology is consulted for further assistance.  Interval History: -reconsulted today after notified by nursing and hospitalist she was having more pauses on telemetry -Patient is sitting upright visiting with her daughter, has no complaints other than some discomfort from her IVs in her upper arms, currently denies dizziness, feels good overall today.  Review of systems limited by patient's baseline dementia  Past Medical History:  Diagnosis Date   Ankle edema, bilateral    Atrophic vaginitis    B12 deficiency    Bilateral carotid artery stenosis    Chronic anemia    CKD stage 3 due to type 2 diabetes mellitus (HCC)    Dementia (HCC)    Diabetes mellitus without complication ([place])    Diverticulitis    Hyperlipidemia    Hypertension    Osteoporosis    PVD (peripheral vascular disease) ([place])    Vitamin D deficiency     Past Surgical History:  Procedure Laterality Date   CATARACT EXTRACTION W/PHACO Right 02/21/2021   Procedure: CATARACT EXTRACTION PHACO AND INTRAOCULAR LENS PLACEMENT ([place]) RIGHT KAHOOK DUAL BLADE GONIOTOMY;  Surgeon: Leandrew Koyanagi, MD;  Location: [facility];  Service: Ophthalmology;   Laterality: Right;  12.45 01:20.1    Medications Prior to Admission  Medication Sig Dispense Refill Last Dose   acetaminophen (TYLENOL) 325 MG tablet Take by mouth.   prn at prn   ammonium lactate (AMLACTIN) 12 % cream Apply 1 Application topically as directed.   unknown at unknown   brimonidine (ALPHAGAN) 0.2 % ophthalmic solution Place 1 drop into both eyes 2 (two) times daily.   unknown at unknown   cetirizine (ZYRTEC) 10 MG tablet Take 10 mg by mouth daily.   unknown at unknown   Cholecalciferol 1.25 MG (50000 UT) capsule Take 50,000 Units by mouth every 7 (seven) days.   unknown at unknown   diclofenac Sodium (VOLTAREN) 1 % GEL Apply 2 g topically 4 (four) times daily as needed.   prn at prn   docusate sodium (COLACE) 100 MG capsule Take 100 mg by mouth daily.   unknown at unknown   dorzolamide-timolol (COSOPT) 22.3-6.8 MG/ML ophthalmic solution 1 drop 2 (two) times daily.   unknown at unknown   FARXIGA 10 MG TABS tablet Take 10 mg by mouth daily.   unknown at unknown   GAVILAX 17 GM/SCOOP powder Take by mouth.   unknown at unknown   hydrALAZINE (APRESOLINE) 25 MG tablet Take 25 mg by mouth 2 (two) times daily.   unknown at unknown   ketoconazole (NIZORAL) 2 % cream Apply 1 Application topically as directed. Apply as directed once or twice daily.   unknown at unknown   ketoconazole (NIZORAL) 2 % shampoo Massage into scalp and let sit 10 minutes before washing out twice weekly.  120 mL 2 unknown at unknown   latanoprost (XALATAN) 0.005 % ophthalmic solution Place 1 drop into both eyes at bedtime.   unknown at unknown   Latanoprostene Bunod (VYZULTA) 0.024 % SOLN Apply to eye.   unknown at unknown   loperamide (IMODIUM) 2 MG capsule Take 2 mg by mouth as needed for diarrhea or loose stools.   prn at prn   magnesium hydroxide (MILK OF MAGNESIA) 400 MG/5ML suspension Take 30 mLs by mouth daily as needed for mild constipation.   prn at prn   memantine (NAMENDA) 5 MG tablet Take 5 mg by mouth 2  (two) times daily.   unknown at unknown   metFORMIN (GLUCOPHAGE) 500 MG tablet Take 500 mg by mouth 2 (two) times daily with a meal.   unknown at unknown   mirabegron ER (MYRBETRIQ) 50 MG TB24 tablet Take 1 tablet (50 mg total) by mouth daily. 30 tablet 11 unknown at unknown   rosuvastatin (CRESTOR) 10 MG tablet Take 10 mg by mouth daily.   unknown at unknown   SENNA-TABS 8.6 MG tablet Take by mouth daily as needed.   prn at prn   triamcinolone ointment (KENALOG) 0.1 % Apply to affected areas twice daily as needed for itchy rash at back. Avoid applying to face, groin, and axilla. Use as directed. Risk of skin atrophy with long-term use reviewed. 80 g 2 prn at prn   valsartan (DIOVAN) 80 MG tablet Take 80 mg by mouth daily.   unknown at unknown   vitamin B-12 (CYANOCOBALAMIN) 1000 MCG tablet Take 1,000 mcg by mouth daily.   unknown at unknown   alum & mag hydroxide-simeth (MAALOX/MYLANTA) 200-200-20 MG/5ML suspension Take 30 mLs by mouth every 6 (six) hours as needed for indigestion or heartburn.   prn at prn   Blood Glucose Monitoring Suppl (FIFTY50 GLUCOSE METER 2.0) w/Device KIT Use as directed. DX:E11.9      Social History   Socioeconomic History   Marital status: Widowed    Spouse name: Not on file   Number of children: Not on file   Years of education: Not on file   Highest education level: Not on file  Occupational History   Not on file  Tobacco Use   Smoking status: Never   Smokeless tobacco: Never  Vaping Use   Vaping Use: Never used  Substance and Sexual Activity   Alcohol use: Never   Drug use: Never   Sexual activity: Not on file  Other Topics Concern   Not on file  Social History Narrative   Lives at assisted living facility- [facility].   Social Determinants of Health   Financial Resource Strain: Not on file  Food Insecurity: Not on file  Transportation Needs: Not on file  Physical Activity: Not on file  Stress: Not on file  Social Connections: Not on file   Intimate Partner Violence: Not on file    No family history on file.    PHYSICAL EXAM General: Elderly caucasian female, in no acute distress. Sitting upright in hospital bed with daughter at bedside HEENT:  Normocephalic and atraumatic. Neck:  No JVD.  Lungs: Normal respiratory effort on room air. Decreased breath sounds bilaterally without appreciable crackles or wheezes Heart: bradycardic but regular Normal S1 and S2 without gallops or murmurs.  Abdomen: Non-distended appearing.  Msk: Normal strength and tone for age. Extremities: Warm and well perfused. No clubbing, cyanosis. No peripheral edema.  Neuro: Alert and oriented  Psych:  Answers questions appropriately.  Labs: Basic Metabolic Panel: Recent Labs    02/05/22 0825 02/06/22 0506 02/07/22 0534  NA 143 141 139  K 3.8 4.1 4.0  CL 115* 113* 111  CO2 21* 23 22  GLUCOSE 132* 109* 129*  BUN 45* 40* 43*  CREATININE 1.15* 1.13* 1.24*  CALCIUM 9.6 9.2 9.5  MG 2.0 1.9  --     Liver Function Tests: Recent Labs    02/06/22 0506 02/07/22 0534  AST 24 25  ALT 9 10  ALKPHOS 51 51  BILITOT 0.6 0.4  PROT 6.3* 6.0*  ALBUMIN 3.2* 3.1*    No results for input(s): "LIPASE", "AMYLASE" in the last 72 hours. CBC: Recent Labs    02/05/22 0825 02/06/22 0506  WBC 5.3 5.0  NEUTROABS 3.4 3.0  HGB 10.0* 10.0*  HCT 31.3* 30.9*  MCV 87.7 87.3  PLT 255 234    Cardiac Enzymes: No results for input(s): "CKTOTAL", "CKMB", "CKMBINDEX", "TROPONINIHS" in the last 72 hours.  BNP: Invalid input(s): "POCBNP" D-Dimer: Recent Labs    02/05/22 0825 02/06/22 0506  DDIMER 0.79* 0.74*    Hemoglobin A1C: No results for input(s): "HGBA1C" in the last 72 hours. Fasting Lipid Panel: No results for input(s): "CHOL", "HDL", "LDLCALC", "TRIG", "CHOLHDL", "LDLDIRECT" in the last 72 hours. Thyroid Function Tests: No results for input(s): "TSH", "T4TOTAL", "T3FREE", "THYROIDAB" in the last 72 hours.  Invalid input(s):  "FREET3" Anemia Panel: Recent Labs    02/06/22 0506  FERRITIN 491*     No results found.   Radiology: CT ABDOMEN PELVIS WO CONTRAST  Result Date: 02/03/2022 CLINICAL DATA:  Abdominal pain, acute, nonlocalized EXAM: CT ABDOMEN AND PELVIS WITHOUT CONTRAST TECHNIQUE: Multidetector CT imaging of the abdomen and pelvis was performed following the standard protocol without IV contrast. RADIATION DOSE REDUCTION: This exam was performed according to the departmental dose-optimization program which includes automated exposure control, adjustment of the mA and/or kV according to patient size and/or use of iterative reconstruction technique. COMPARISON:  None Available. FINDINGS: Lower chest: Moderate right coronary artery calcification. Global cardiac size within normal limits. No acute abnormality. Hepatobiliary: Cholelithiasis without pericholecystic inflammatory change. Liver unremarkable on this noncontrast examination. No intra or extrahepatic biliary ductal dilation. Pancreas: Unremarkable Spleen: Unremarkable Adrenals/Urinary Tract: Adrenal glands are unremarkable. Kidneys are normal, without renal calculi, focal lesion, or hydronephrosis. Bladder is unremarkable. Stomach/Bowel: Severe distal colonic diverticulosis involving the distal transverse, descending, and sigmoid colon. No superimposed acute inflammatory change. Stomach, small bowel, and large bowel are unremarkable. Appendix normal. No free intraperitoneal gas or fluid. Vascular/Lymphatic: Extensive aortoiliac atherosclerotic calcification. No aortic aneurysm. Particularly prominent atherosclerotic calcification involving the mesenteric and renal artery origins, however, the degree of stenosis is not well assessed on this noncontrast examination. No pathologic adenopathy within the abdomen and pelvis. Reproductive: Status post hysterectomy. No adnexal masses. Other: No abdominal wall hernia.  Rectum unremarkable. Musculoskeletal: Remote inferior  endplate fracture C78 with no significant loss of height. No acute bone abnormality. Degenerative changes are seen within the lumbar spine. No lytic or blastic bone lesion. IMPRESSION: 1. No acute intra-abdominal pathology identified. No definite radiographic explanation for the patient's reported symptoms. 2. Moderate right coronary artery calcification. 3. Cholelithiasis. 4. Severe distal colonic diverticulosis without superimposed acute inflammatory change. 5. Peripheral vascular disease. Suspected hemodynamically significant mesenteric and renal artery stenoses. If there is clinical evidence of chronic mesenteric ischemia or hemodynamically significant renal artery stenosis, CT arteriography may be more helpful for further evaluation. Aortic Atherosclerosis (ICD10-I70.0). Electronically Signed   By: Cassandria Anger  Christa See M.D.   On: 02/03/2022 23:26   CT HEAD WO CONTRAST (5MM)  Result Date: 02/03/2022 CLINICAL DATA:  Head trauma, minor (Age >= 65y). Altered mental status. EXAM: CT HEAD WITHOUT CONTRAST TECHNIQUE: Contiguous axial images were obtained from the base of the skull through the vertex without intravenous contrast. RADIATION DOSE REDUCTION: This exam was performed according to the departmental dose-optimization program which includes automated exposure control, adjustment of the mA and/or kV according to patient size and/or use of iterative reconstruction technique. COMPARISON:  None Available. FINDINGS: Brain: Normal anatomic configuration. Parenchymal volume loss is commensurate with the patient's age. Mild periventricular white matter changes are present likely reflecting the sequela of small vessel ischemia. Remote lacunar infarct within the right thalamus and left globus pallidus. No abnormal intra or extra-axial mass lesion or fluid collection. No abnormal mass effect or midline shift. No evidence of acute intracranial hemorrhage or infarct. Ventricular size is normal. Cerebellum unremarkable.  Vascular: No asymmetric hyperdense vasculature at the skull base. Extensive atherosclerotic calcification within the carotid siphons. Skull: Intact Sinuses/Orbits: Paranasal sinuses are clear. Orbits are unremarkable. Other: Left mastoidectomy has been performed. Additionally, the auditory ossicles are absent suggesting prior resection. Debris or packing material is noted within the resection bed. Right mastoid air cells and middle ear cavity is clear. IMPRESSION: 1. No acute intracranial abnormality. 2. Mild senescent change and remote lacunar infarcts. 3. Postsurgical changes involving the left mastoid air cells and middle ear cavity. Debris versus residual packing material within the a resection bed. Electronically Signed   By: Fidela Salisbury M.D.   On: 02/03/2022 23:17   DG Chest Portable 1 View  Result Date: 02/03/2022 CLINICAL DATA:  Sob.  Recent covid. Cough, sob, nausea, vomiting. EXAM: PORTABLE CHEST 1 VIEW COMPARISON:  Chest x-ray 06/10/2021 FINDINGS: The heart and mediastinal contours are unchanged. Aortic calcification. No focal consolidation. No pulmonary edema. No pleural effusion. No pneumothorax. No acute osseous abnormality. Right shoulder rotator cuff anchor suture. IMPRESSION: 1. No active disease. 2.  Aortic Atherosclerosis (ICD10-I70.0). Electronically Signed   By: Iven Finn M.D.   On: 02/03/2022 22:45    ECHO 05/10/2019 IMPRESSIONS    1. Left ventricular ejection fraction, by visual estimation, is 60 to  65%. The left ventricle has normal function. There is no left ventricular  hypertrophy.   2. Global right ventricle has normal systolic function.The right  ventricular size is normal. No increase in right ventricular wall  thickness.   3. Left atrial size was normal.   4. Right atrial size was normal.   5. The mitral valve is normal in structure. Mild to moderate mitral valve  regurgitation. No evidence of mitral stenosis.   6. The tricuspid valve is normal in structure.  Tricuspid valve  regurgitation is mild.   7. The aortic valve is normal in structure. Aortic valve regurgitation is  not visualized. No evidence of aortic valve sclerosis or stenosis.   8. The pulmonic valve was normal in structure. Pulmonic valve  regurgitation is not visualized.   9. Mildly elevated pulmonary artery systolic pressure.  10. The inferior vena cava is normal in size with greater than 50%  respiratory variability, suggesting right atrial pressure of 3 mmHg.   FINDINGS   Left Ventricle: Left ventricular ejection fraction, by visual estimation,  is 60 to 65%. The left ventricle has normal function. There is no left  ventricular hypertrophy. Normal left atrial pressure.   Right Ventricle: The right ventricular size is normal. No increase in  right ventricular wall thickness. Global RV systolic function is has  normal systolic function. The tricuspid regurgitant velocity is 2.67 m/s,  and with an assumed right atrial pressure   of 10 mmHg, the estimated right ventricular systolic pressure is mildly  elevated at 38.5 mmHg.   Left Atrium: Left atrial size was normal in size.   Right Atrium: Right atrial size was normal in size   Pericardium: There is no evidence of pericardial effusion.   Mitral Valve: The mitral valve is normal in structure. No evidence of  mitral valve stenosis by observation. MV peak gradient, 4.0 mmHg. Mild to  moderate mitral valve regurgitation.   Tricuspid Valve: The tricuspid valve is normal in structure. Tricuspid  valve regurgitation is mild.   Aortic Valve: The aortic valve is normal in structure. Aortic valve  regurgitation is not visualized. The aortic valve is structurally normal,  with no evidence of sclerosis or stenosis. Aortic valve mean gradient  measures 4.0 mmHg. Aortic valve peak  gradient measures 8.5 mmHg. Aortic valve area, by VTI measures 1.59 cm.   Pulmonic Valve: The pulmonic valve was normal in structure. Pulmonic valve   regurgitation is not visualized.   Aorta: The aortic root, ascending aorta and aortic arch are all  structurally normal, with no evidence of dilitation or obstruction.   Venous: The inferior vena cava is normal in size with greater than 50%  respiratory variability, suggesting right atrial pressure of 3 mmHg.   IAS/Shunts: No atrial level shunt detected by color flow Doppler. There is  no evidence of a patent foramen ovale. No ventricular septal defect is  seen or detected. There is no evidence of an atrial septal defect.      LEFT VENTRICLE  PLAX 2D  LVIDd:         4.95 cm  Diastology  LVIDs:         3.13 cm  LV e' lateral:   9.03 cm/s  LV PW:         0.99 cm  LV E/e' lateral: 9.5  LV IVS:        0.66 cm  LV e' medial:    4.03 cm/s  LVOT diam:     1.80 cm  LV E/e' medial:  21.3  LV SV:         77 ml  LV SV Index:   37.33  LVOT Area:     2.54 cm      LEFT ATRIUM             Index  LA diam:        3.70 cm 1.85 cm/m  LA Vol (A2C):   44.6 ml 22.30 ml/m  LA Vol (A4C):   70.1 ml 35.06 ml/m  LA Biplane Vol: 56.6 ml 28.30 ml/m   AORTIC VALVE                   PULMONIC VALVE  AV Area (Vmax):    1.80 cm    PV Vmax:       1.05 m/s  AV Area (Vmean):   1.71 cm    PV Vmean:      73.500 cm/s  AV Area (VTI):     1.59 cm    PV VTI:        0.263 m  AV Vmax:           146.00 cm/s PV Peak grad:  4.4 mmHg  AV Vmean:  91.200 cm/s PV Mean grad:  2.0 mmHg  AV VTI:            0.391 m  AV Peak Grad:      8.5 mmHg  AV Mean Grad:      4.0 mmHg  LVOT Vmax:         103.00 cm/s  LVOT Vmean:        61.400 cm/s  LVOT VTI:          0.244 m  LVOT/AV VTI ratio: 0.62     AORTA  Ao Root diam: 3.00 cm   MITRAL VALVE                        TRICUSPID VALVE  MV Area (PHT): 2.60 cm             TR Peak grad:   28.5 mmHg  MV Peak grad:  4.0 mmHg             TR Vmax:        267.00 cm/s  MV Mean grad:  1.0 mmHg  MV Vmax:       1.00 m/s             SHUNTS  MV Vmean:      55.1 cm/s             Systemic VTI:  0.24 m  MV VTI:        0.37 m               Systemic Diam: 1.80 cm  MV PHT:        84.68 msec  MV Decel Time: 292 msec  MV E velocity: 85.90 cm/s 103 cm/s  MV A velocity: 69.40 cm/s 70.3 cm/s  MV E/A ratio:  1.24       1.5      Isaias Cowman MD  Electronically signed by Isaias Cowman MD  Signature Date/Time: 05/11/2019/1:17:54 PM   TELEMETRY reviewed by me (LT) 02/07/2022 : Sinus bradycardia with occasional PACs, noted a 2 instances of 3.8-second pauses on telemetry today, with occasional 1 to 2-second pauses without evidence of high degree AV block or atrial fibrillation  EKG reviewed by me: Sinus bradycardia with PACs RBBB rate 50  Data reviewed by me (LT) 02/07/2022: Hospitalist progress note, nursing notes, CBC, BMP, D-dimer, telemetry, vitals, discussed with hospitalist and nursing  ASSESSMENT AND PLAN:   Martha Walker is an 69yoF with a PMH of dementia, CKD 3, type 2 diabetes, hyperlipidemia, hypertension, carotid artery stenosis who presented to [facility] ED from her assisted living facility on 02/03/2022 with progressive weakness, N/V/D, and confusion above her baseline.  She was positive for COVID-19 on admission with a revealed significant leukocytes for which acute cystitis was suspected.  During her hospitalization she has been bradycardic with heart rate between 30s - 60s for which cardiology is consulted for further assistance.  #Acute cystitis #COVID-19 #Asymptomatic sinus bradycardia The patient presented from her assisted living facility with generalized weakness, nausea, vomiting, diarrhea, and confusion above her baseline dementia.  UA consistent with acute cystitis, incidentally COVID-19 positive on admission.  Telemetry during admission has shown sinus bradycardia with heart rates in the mid 40s mostly, appropriately increasing to the 50s and 60s with activity.  There have been 2 instances of 3.8-second pauses the afternoon of 9/7, the patient apparently  asymptomatic from this. -Agree with current therapy for treatment of UTI and COVID-19 -Continuous monitoring on telemetry  while inpatient -give a 250 mL bolus of fluids for relative hypotension -Avoid beta-blockers, other AV nodal blocking agents -Monitor and replete electrolytes as needed to maintain K >4, mag >2 -No indication for permanent pacemaker at this time. -Can discharge home with a 30-day live event monitor.  Currently pending 10-day COVID quarantine requirement prior to rehab placement.  This patient's plan of care was discussed and created with Dr. Nehemiah Massed and he is in agreement.  Signed: Tristan Schroeder , PA-C 02/07/2022, 4:34 PM [facility]  We did review of the telemetry strips and as reported above, there has been no evidence of symptomatic bradycardia requiring further intervention.  The patient will have continuation observation and telemetry.   I agree with assessment and plan above. Serafina Royals MD [facility]

## 2022-02-07 NOTE — Progress Notes (Signed)
Minky with telemetry called to report that the patient was having pauses which was not new for the patient. Then the pauses started to widen one was 2.97 then the next was 3.13. MD notified now new orders at this time, she stated she is coming to see patient.

## 2022-02-07 NOTE — Progress Notes (Signed)
   02/07/22 1139  Assess: MEWS Score  Temp 98.1 F (36.7 C)  BP (!) 88/39  MAP (mmHg) (!) 56  Pulse Rate (!) 48  Resp 17  SpO2 96 %  O2 Device Room Air  Assess: MEWS Score  MEWS Temp 0  MEWS Systolic 1  MEWS Pulse 1  MEWS RR 0  MEWS LOC 0  MEWS Score 2  MEWS Score Color Yellow  Assess: if the MEWS score is Yellow or Red  Were vital signs taken at a resting state? Yes  Focused Assessment No change from prior assessment  Does the patient meet 2 or more of the SIRS criteria? No  MEWS guidelines implemented *See Row Information* Yes  Treat  Pain Scale 0-10  Pain Score 0  Take Vital Signs  Increase Vital Sign Frequency  Yellow: Q 2hr X 2 then Q 4hr X 2, if remains yellow, continue Q 4hrs  Escalate  MEWS: Escalate Yellow: discuss with charge nurse/RN and consider discussing with provider and RRT  Notify: Charge Nurse/RN  Name of Charge Nurse/RN Notified Siri Cole, RN  Date Charge Nurse/RN Notified 02/07/22  Time Charge Nurse/RN Notified 1144  Notify: Provider  Provider Name/Title Dr. Arbutus Ped  Date Provider Notified 02/07/22  Time Provider Notified 1144  Method of Notification  (secure chat)  Notification Reason Change in status  Provider response En route  Date of Provider Response 02/07/22  Time of Provider Response 1146  Document  Patient Outcome Other (Comment) (conitnue to monitor)  Progress note created (see row info) Yes  Assess: SIRS CRITERIA  SIRS Temperature  0  SIRS Pulse 0  SIRS Respirations  0  SIRS WBC 1  SIRS Score Sum  1

## 2022-02-07 NOTE — Progress Notes (Signed)
PROGRESS NOTE  Martha Walker  JHE:[id number] DOB: 12/12/1931 DOA: 02/03/2022 PCP: Leonel Ramsay, MD   Brief Narrative: Patient is a 88 year old female with history of carotid stenosis, CKD stage IIIa, diabetes type 2, hyperlipidemia, hypertension, peripheral vascular disease who presented from ALF with complaint of progressive weakness, nausea, vomiting, diarrhea, confusion.  On presentation she was hemodynamically stable, on room air.  Lab work showed creatinine of 2.15, baseline creatinine around 1.3.  COVID screen test positive.  UA showed significant leukocytes, UTI suspected, started on ceftriaxone, urine culture sent.  Hospital course remarkable for intermittent  bradycardia.  Cardiology consulted, no plan for intervention.  PT/OT consulted for weakness.  Assessment & Plan:  Principal Problem:   COVID-19 Active Problems:   Pressure injury of skin  COVID infection: Presented with nausea, vomiting, diarrhea, weakness. Suspected  COVID gastroenteritis.  The symptoms have resolved now.  Continue molnupiravir.   Continue airborne and contact precautions. On 10 day isolation to go to SNF/rehab.   UTI suspected: UA was suggestive  of UTI with some few leukocytes, but unclear if pt symptomatic due to dementia. Treated with 3 days empiric ceftriaxone.  Urine culture grew lactobacillus spp, doubt pathogenic.    Bradycardia: She is mostly asymptomatic.  Patient heart rate runs low to 40.  Heart rate fluctuating between 40-70.  Central telemetry has reported occasional pauses of about 2 seconds.  Per chart review, during last admission, she was seen by Dr. 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  Cardiology consulted, recommending conservative management.  Monitor on telemetry.  Avoid AV nodal blocker agents.  Electrolytes stable.   Confusion/Dementia : Likely from metabolic encephalopathy from UTI, COVID.  As per the daughter, she is intermittently confused, she likely  has some underlying dementia / cognitive impairment. Takes memantine. Continue supportive care, frequent reorientation, delirium precautions.  CT head showed remote lacunar infarcts, no acute findings   AKI in CKD stage IIIa: Baseline creatinine 1.3.  Presented with creatinine in the range of 2.  Now back to baseline with IV fluid, IV fluid discontinued   Bilateral carotid stenosis: On Plavix, statin   Diabetes type 2: On metformin at home.  Continue sliding scale insulin.  Blood sugars   Hyperlipidemia: On statin at home   Hypotension: noted today 9/7.  Will d/c antihypertensives for now and monitor closely. Maintain MAP>65 with fluids if needed.    Hypertension: Currently blood pressure stable.  Hold Hydralazine.  ARB was held due to AKI, now held for soft BP's   Peripheral vascular disease: CT abdomen/pelvis showed hemodynamically significant mesenteric and renal artery stenoses.  Further work-up as an outpatient, cannot do contrast studies like CT angiogram due  her kidney function.  Questionable significance of further work-up due to her advanced age  Weakness/disposition: Patient is from ALF.  PT/OT consulted, SNF recommended.  TOC following   Stage II sacral wound - POA.  I agree with the wound description as outlined below. Continue diligent wound care, frequent repositioning and monitor site closely for signs of infection.   Pressure Injury 02/04/22 Sacrum Medial Stage 2 -  Partial thickness loss of dermis presenting as a shallow open injury with a red, pink wound bed without slough. 3x3 open area to skin (Active)  02/04/22 0430  Location: Sacrum  Location Orientation: Medial  Staging: Stage 2 -  Partial thickness loss of dermis presenting as a shallow open injury with a red, pink wound bed without slough.  Wound Description (Comments): 3x3 open  area to skin  Present on Admission: Yes  Dressing Type Foam - Lift dressing to assess site every shift 02/06/22 1037    DVT  prophylaxis:heparin injection 5,000 Units Start: 02/04/22 0600     Code Status: Full Code  Family Communication: Daughter at bedside 9/6  Patient status: Inpatient  Patient is from : ALF  Anticipated discharge to: SNF  Estimated DC date: Requires 10-day isolation for COVID-positive   Consultants: Cardiology  Procedures:None  Antimicrobials:     Subjective: Patient awake sitting up in bed today.  She reports feeling "bad" this morning but isn't able to give any specific symptoms.  Just that she felt bad.  RN reported earlier pt's BP was low.  Pt denies feeling dizzy or lightheaded.    Objective: Vitals:   02/06/22 2045 02/07/22 0019 02/07/22 0524 02/07/22 0725  BP: (!) 145/83 109/62 112/60 (!) 107/51  Pulse: 82 63 66 63  Resp:  '16 12 17  '$ Temp:  97.9 F (36.6 C) 98.2 F (36.8 C) 98.4 F (36.9 C)  TempSrc:   Oral   SpO2: 100% 99%  97%  Weight:      Height:       Filed Weights   02/03/22 2105 02/04/22 0406  Weight: 77.1 kg 77 kg    Examination:  General exam: awake, alert, no acute distress HEENT: moist mucus membranes, hearing grossly normal  Respiratory system: On room air, normal respiratory effort. Cardiovascular system: normal S1/S2, bradycardic, no pedal edema.   Central nervous system: A&O x. no gross focal neurologic deficits, normal speech Extremities: moves all, no edema, normal tone Skin: dry, intact, normal temperature Psychiatry: normal mood, congruent affect   Data Reviewed:    Labs notable for --- Cr 1.13 >> 1.24, albumin 3.1, glucose 129, BUN 43         LOS: 3 days   Martha Slocumb, DO [facility] P9/12/2021, 8:37 AM

## 2022-02-08 DIAGNOSIS — U071 COVID-19: Secondary | ICD-10-CM | POA: Diagnosis not present

## 2022-02-08 LAB — COMPREHENSIVE METABOLIC PANEL
ALT: 13 U/L (ref 0–44)
AST: 29 U/L (ref 15–41)
Albumin: 3.1 g/dL — ABNORMAL LOW (ref 3.5–5.0)
Alkaline Phosphatase: 61 U/L (ref 38–126)
Anion gap: 6 (ref 5–15)
BUN: 50 mg/dL — ABNORMAL HIGH (ref 8–23)
CO2: 23 mmol/L (ref 22–32)
Calcium: 9.5 mg/dL (ref 8.9–10.3)
Chloride: 111 mmol/L (ref 98–111)
Creatinine, Ser: 1.55 mg/dL — ABNORMAL HIGH (ref 0.44–1.00)
GFR, Estimated: 32 mL/min — ABNORMAL LOW (ref >60)
Glucose, Bld: 122 mg/dL — ABNORMAL HIGH (ref 70–99)
Potassium: 4.2 mmol/L (ref 3.5–5.1)
Sodium: 140 mmol/L (ref 135–145)
Total Bilirubin: 0.4 mg/dL (ref 0.3–1.2)
Total Protein: 6 g/dL — ABNORMAL LOW (ref 6.5–8.1)

## 2022-02-08 LAB — GLUCOSE, CAPILLARY
Glucose-Capillary: 110 mg/dL — ABNORMAL HIGH (ref 70–99)
Glucose-Capillary: 195 mg/dL — ABNORMAL HIGH (ref 70–99)
Glucose-Capillary: 141 mg/dL — ABNORMAL HIGH (ref 70–99)
Glucose-Capillary: 112 mg/dL — ABNORMAL HIGH (ref 70–99)

## 2022-02-08 MED ORDER — ENOXAPARIN SODIUM 30 MG/0.3ML IJ SOSY
30.0000 mg | PREFILLED_SYRINGE | INTRAMUSCULAR | Status: DC
  Administered 2022-02-08 – 2022-02-14 (×7): 30 mg via SUBCUTANEOUS
  Filled 2022-02-08 (×7): qty 0.3

## 2022-02-08 NOTE — Progress Notes (Signed)
Occupational Therapy Evaluation Patient Details Name: Martha Walker MRN: [id number] DOB: [birth year] Today's Date: 02/08/2022   History of Present Illness 88 year old female with history of carotid stenosis, CKD stage IIIa, diabetes type 2, hyperlipidemia, hypertension, peripheral vascular disease who presented from ALF with complaint of progressive weakness, nausea, vomiting, diarrhea, confusion.   Clinical Impression   Chart reviewed, pt greeted in room agreeable to OT tx session. Co tx completed with PT on this date. Tx session targeted improving activity tolerance for improved ADL task completion. Improved activity tolerance with amb to bathroom, standing toileting tasks. Pt required MAX A for STS from regular toilet on this date.HR up to 56 with activity. Pt is left as received, all needs met. Discharge recommendation remains appropriate.  OT will follow acutely.      Recommendations for follow up therapy are one component of a multi-disciplinary discharge planning process, led by the attending physician.  Recommendations may be updated based on patient status, additional functional criteria and insurance authorization.   Follow Up Recommendations  Skilled nursing-short term rehab (<3 hours/day)    Assistance Recommended at Discharge Frequent or constant Supervision/Assistance  Patient can return home with the following A little help with walking and/or transfers;A lot of help with bathing/dressing/bathroom;Assistance with cooking/housework;Assist for transportation;Help with stairs or ramp for entrance    Functional Status Assessment     Equipment Recommendations  BSC/3in1;Other (comment) (2WW)    Recommendations for Other Services       Precautions / Restrictions Precautions Precautions: Fall Restrictions Weight Bearing Restrictions: No      Mobility Bed Mobility               General bed mobility comments: NT in recliner pre/post session    Transfers Overall  transfer level: Needs assistance Equipment used: Rolling walker (2 wheels) Transfers: Sit to/from Stand Sit to Stand: Max assist, Mod assist           General transfer comment: MOD A for sts from chair, MAX A for sts from toilet      Balance Overall balance assessment: Needs assistance Sitting-balance support: Feet supported Sitting balance-Leahy Scale: Good     Standing balance support: Single extremity supported, No upper extremity supported Standing balance-Leahy Scale: Poor                             ADL either performed or assessed with clinical judgement   ADL Overall ADL's : Needs assistance/impaired     Grooming: Wash/dry hands;Oral care;Standing               Lower Body Dressing: Minimal assistance;Sit to/from stand   Toilet Transfer: Ambulation;Rolling walker (2 wheels);Maximal assistance Toilet Transfer Details (indicate cue type and reason): STS off toilet with MAX A due to low toilet height Toileting- Clothing Manipulation and Hygiene: Minimal assistance;Sit to/from stand       Functional mobility during ADLs: Supervision/safety;Min guard;Rolling walker (2 wheels) (amb approx 30' in room with RW with one rest break; standing to complete peri care with CGA wth RW)       Vision         Perception     Praxis      Pertinent Vitals/Pain Pain Assessment Pain Assessment: Faces Faces Pain Scale: Hurts little more Pain Location: lower back Pain Descriptors / Indicators: Aching, Sore Pain Intervention(s): Limited activity within patient's tolerance, Repositioned, Monitored during session     Hand Dominance  Extremity/Trunk Assessment             Communication     Cognition Arousal/Alertness: Awake/alert Behavior During Therapy: Flat affect Overall Cognitive Status: No family/caregiver present to determine baseline cognitive functioning Area of Impairment: Memory, Following commands, Safety/judgement, Problem solving                    Current Attention Level: Sustained Memory: Decreased short-term memory Following Commands: Follows one step commands with increased time Safety/Judgement: Decreased awareness of safety, Decreased awareness of deficits Awareness: Emergent Problem Solving: Slow processing, Requires verbal cues, Requires tactile cues       General Comments       Exercises     Shoulder Instructions      Home Living                                          Prior Functioning/Environment                          OT Problem List:        OT Treatment/Interventions:      OT Goals(Current goals can be found in the care plan section)    OT Frequency: Min 2X/week    Co-evaluation PT/OT/SLP Co-Evaluation/Treatment: Yes Reason for Co-Treatment: Complexity of the patient's impairments (multi-system involvement) PT goals addressed during session: Mobility/safety with mobility OT goals addressed during session: ADL's and self-care      AM-PAC OT "6 Clicks" Daily Activity     Outcome Measure Help from another person eating meals?: None Help from another person taking care of personal grooming?: A Little Help from another person toileting, which includes using toliet, bedpan, or urinal?: A Little Help from another person bathing (including washing, rinsing, drying)?: A Little Help from another person to put on and taking off regular upper body clothing?: A Little Help from another person to put on and taking off regular lower body clothing?: A Little 6 Click Score: 19   End of Session Equipment Utilized During Treatment: Rolling walker (2 wheels) Nurse Communication: Mobility status  Activity Tolerance: Patient tolerated treatment well Patient left: in chair;with call bell/phone within reach  OT Visit Diagnosis: Other abnormalities of gait and mobility (R26.89);Muscle weakness (generalized) (M62.81)                Time: 2683-4196 OT Time  Calculation (min): 23 min Charges:  OT General Charges $OT Visit: 1 Visit OT Treatments $Self Care/Home Management : 8-22 mins  Shanon Payor, OTD OTR/L  02/08/22, 4:10 PM

## 2022-02-08 NOTE — Progress Notes (Signed)
Physical Therapy Treatment Patient Details Name: Martha Walker MRN: [id number] DOB: 10-29-1931 Today's Date: 02/08/2022   History of Present Illness 88 year old female with history of carotid stenosis, CKD stage IIIa, diabetes type 2, hyperlipidemia, hypertension, peripheral vascular disease who presented from ALF with complaint of progressive weakness, nausea, vomiting, diarrhea, confusion.    PT Comments    Patient agreeable to PT. She was seated in the recliner chair. Patient continues to have limited activity tolerance with functional activity. She required multiple seated rest breaks. She ambulated in the room and to the bathroom, requiring steadying assistance. Assistance required for all standing bouts from various surfaces. Heart rate in the high 50's with activity. Recommend to continue PT to maximize independence and facilitate return to prior level of function.    Recommendations for follow up therapy are one component of a multi-disciplinary discharge planning process, led by the attending physician.  Recommendations may be updated based on patient status, additional functional criteria and insurance authorization.  Follow Up Recommendations  Skilled nursing-short term rehab (<3 hours/day) Can patient physically be transported by private vehicle: No   Assistance Recommended at Discharge Frequent or constant Supervision/Assistance  Patient can return home with the following A little help with bathing/dressing/bathroom;Assistance with cooking/housework;Direct supervision/assist for medications management;Assist for transportation   Equipment Recommendations   (to be determined at next level of care)    Recommendations for Other Services       Precautions / Restrictions Precautions Precautions: Fall Restrictions Weight Bearing Restrictions: No     Mobility  Bed Mobility               General bed mobility comments: not assessed as patient sitting up on arrival and  post session    Transfers Overall transfer level: Needs assistance   Transfers: Sit to/from Stand Sit to Stand: Max assist, Mod assist           General transfer comment: multiple standing bouts performed. Mod A for standing from recliner chair and a standard chair using bathroom railing. Max A for standing from toilet. verbal cues for hand placement and anterior weight shifting    Ambulation/Gait Ambulation/Gait assistance: Min assist Gait Distance (Feet): 45 Feet Assistive device: Rolling walker (2 wheels) Gait Pattern/deviations: Step-to pattern, Narrow base of support, Trunk flexed Gait velocity: decreased     General Gait Details: patient required several seated rest breaks with ambulation due to fatigue and generalized weakness. verbal cues to stand closer to rolling walker for support to avoid excessive trunk flexion. heart rate in the high 50's with mobility. recommend a chair follow for longer ambulation distances   Stairs             Wheelchair Mobility    Modified Rankin (Stroke Patients Only)       Balance Overall balance assessment: Needs assistance Sitting-balance support: Feet supported Sitting balance-Leahy Scale: Good     Standing balance support: Single extremity supported, No upper extremity supported Standing balance-Leahy Scale: Poor Standing balance comment: patient able to complete peri-care and ADLs in standing with minimal assistance for steadying and intermittent unilateral UE support. second person required to get a chair  for sitting rest breaks in the bathroom between bouts of activity/ADLs due to fatigue.                            Cognition Arousal/Alertness: Awake/alert Behavior During Therapy: Flat affect Overall Cognitive Status: No family/caregiver present to determine baseline cognitive  functioning                                 General Comments: patient has baseline dementia per notes. she is  able to follow single step commands with increased time        Exercises      General Comments        Pertinent Vitals/Pain Pain Assessment Pain Assessment: Faces Faces Pain Scale: Hurts little more Pain Location: low back Pain Descriptors / Indicators: Sore Pain Intervention(s): Limited activity within patient's tolerance    Home Living                          Prior Function            PT Goals (current goals can now be found in the care plan section) Acute Rehab PT Goals Patient Stated Goal: to increase independence PT Goal Formulation: With patient Time For Goal Achievement: 02/18/22 Potential to Achieve Goals: Fair Progress towards PT goals: Progressing toward goals    Frequency    Min 2X/week      PT Plan Current plan remains appropriate    Co-evaluation PT/OT/SLP Co-Evaluation/Treatment: Yes Reason for Co-Treatment: Complexity of the patient's impairments (multi-system involvement) PT goals addressed during session: Mobility/safety with mobility OT goals addressed during session: ADL's and self-care      AM-PAC PT "6 Clicks" Mobility   Outcome Measure  Help needed turning from your back to your side while in a flat bed without using bedrails?: A Little Help needed moving from lying on your back to sitting on the side of a flat bed without using bedrails?: A Lot Help needed moving to and from a bed to a chair (including a wheelchair)?: A Little Help needed standing up from a chair using your arms (e.g., wheelchair or bedside chair)?: A Lot Help needed to walk in hospital room?: A Little Help needed climbing 3-5 steps with a railing? : A Lot 6 Click Score: 15    End of Session   Activity Tolerance: Patient limited by fatigue Patient left: in chair;with call bell/phone within reach Nurse Communication: Mobility status PT Visit Diagnosis: Unsteadiness on feet (R26.81);Muscle weakness (generalized) (M62.81);Difficulty in walking, not  elsewhere classified (R26.2)     Time: 1610-9604 PT Time Calculation (min) (ACUTE ONLY): 27 min  Charges:  $Therapeutic Activity: 8-22 mins                     Minna Merritts, PT, MPT    Percell Locus 02/08/2022, 3:49 PM

## 2022-02-08 NOTE — TOC Progression Note (Addendum)
Transition of Care [facility]) - Progression Note    Patient Details  Name: KALLEN DELATORRE MRN: [id number] Date of Birth: [birth year]  Transition of Care [facility]) CM/SW Jobos, LCSW Phone Number: 02/08/2022, 1:46 PM  Clinical Narrative:    CSW called and spoke with patient's daughter Manuela Schwartz.  Explained current bed offers. She requested CSW send out patient's referral to SNFs in a 20 mile radius. CSW explained CSW can send them to the ones in the HUB in a 20 mile radius. Manuela Schwartz stated her top preference is [facility], she is aware they did not accept patient initially. She stated she called [facility] and they told her they may re evaluate on Monday and advised her to call Monday. She requested TOC also follow up with [facility] on Monday.  Manuela Schwartz stated her 2nd choice would either be [facility] or [facility] and she plans to visit these this weekend for a tour.  She stated patient was at [facility] prior to admission and does not want patient to go back there, feels she needs SNF for LTC. She states she will work on this on her own.  CSW extended bed search to SNFs in 20 mile radius.  Patient cannot DC until 10 days from positive COVID test 9/3 which Manuela Schwartz states she understands.        Expected Discharge Plan and Services                                                 Social Determinants of Health (SDOH) Interventions    Readmission Risk Interventions     No data to display

## 2022-02-08 NOTE — Progress Notes (Signed)
   02/08/22 1204  Assess: MEWS Score  Temp 97.8 F (36.6 C)  BP (!) 100/35  MAP (mmHg) (!) 54  Pulse Rate (!) 43  SpO2 100 %  O2 Device Room Air  Assess: MEWS Score  MEWS Temp 0  MEWS Systolic 1  MEWS Pulse 1  MEWS RR 0  MEWS LOC 0  MEWS Score 2  MEWS Score Color Yellow  Assess: if the MEWS score is Yellow or Red  Were vital signs taken at a resting state? Yes  Focused Assessment Change from prior assessment (see assessment flowsheet)  Does the patient meet 2 or more of the SIRS criteria? No  MEWS guidelines implemented *See Row Information* Yes  Notify: Provider  Provider Name/Title Dr Arbutus Ped  Date Provider Notified 02/08/22  Time Provider Notified 1200  Method of Notification Face-to-face  Notification Reason Change in status  Provider response No new orders  Date of Provider Response 02/08/22  Time of Provider Response 1200  Assess: SIRS CRITERIA  SIRS Temperature  0  SIRS Pulse 0  SIRS Respirations  0  SIRS WBC 1  SIRS Score Sum  1

## 2022-02-08 NOTE — Progress Notes (Signed)
PROGRESS NOTE  ZAINEB NOWACZYK  TKZ:[id number] DOB: August 22, 1931 DOA: 02/03/2022 PCP: Leonel Ramsay, MD   Brief Narrative: Patient is a 88 year old female with history of carotid stenosis, CKD stage IIIa, diabetes type 2, hyperlipidemia, hypertension, peripheral vascular disease who presented from ALF with complaint of progressive weakness, nausea, vomiting, diarrhea, confusion.  On presentation she was hemodynamically stable, on room air.  Lab work showed creatinine of 2.15, baseline creatinine around 1.3.  COVID screen test positive.  UA showed significant leukocytes, UTI suspected, started on ceftriaxone, urine culture sent.  Hospital course remarkable for intermittent  bradycardia.  Cardiology consulted, no plan for intervention.  PT/OT consulted for weakness.  Assessment & Plan:  Principal Problem:   COVID-19 Active Problems:   Bradycardia   Pressure injury of skin  COVID infection: Presented with nausea, vomiting, diarrhea, weakness. Suspected  COVID gastroenteritis.  The symptoms have resolved now.  Continue molnupiravir.   Continue airborne and contact precautions. On 10 day isolation to go to SNF/rehab.   UTI suspected: UA was suggestive  of UTI with some few leukocytes, but unclear if pt symptomatic due to dementia. Treated with 3 days empiric ceftriaxone.  Urine culture grew lactobacillus spp, doubt pathogenic.    Bradycardia: She is mostly asymptomatic.  Patient heart rate runs low to 40.  Heart rate fluctuating between 40-70.  Central telemetry has reported occasional pauses of about 2 seconds.  Per chart review, during last admission, she was seen by Dr. 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  Cardiology consulted, recommending conservative management.  Monitor on telemetry.  Avoid AV nodal blocker agents.  Electrolytes stable.   Confusion/Dementia : Likely from metabolic encephalopathy from UTI, COVID.  As per the daughter, she is intermittently  confused, she likely has some underlying dementia / cognitive impairment. Takes memantine. Continue supportive care, frequent reorientation, delirium precautions.  CT head showed remote lacunar infarcts, no acute findings   AKI in CKD stage IIIa: Baseline creatinine 1.3.  Presented with creatinine in the range of 2.  Now back to baseline with IV fluid, IV fluid discontinued   Bilateral carotid stenosis: On Plavix, statin   Diabetes type 2: On metformin at home.  Continue sliding scale insulin.  Blood sugars   Hyperlipidemia: On statin at home   Hypotension: noted on 9/7.   We have d/c'd antihypertensives for now. Monitor BP closely.  Maintain MAP>65 with fluids if needed.   Given small fluid bolus on 9/7.  Hypertension: Currently blood pressure stable.  Hold Hydralazine.  ARB was held due to AKI, now held for soft BP's   Peripheral vascular disease: CT abdomen/pelvis showed hemodynamically significant mesenteric and renal artery stenoses.  Further work-up as an outpatient, cannot do contrast studies like CT angiogram due  her kidney function.  Questionable significance of further work-up due to her advanced age  Weakness/disposition: Patient is from ALF.  PT/OT consulted, SNF recommended.  TOC following for placement.   Stage II sacral wound - POA.  I agree with the wound description as outlined below. Continue diligent wound care, frequent repositioning and monitor site closely for signs of infection.   Pressure Injury 02/04/22 Sacrum Medial Stage 2 -  Partial thickness loss of dermis presenting as a shallow open injury with a red, pink wound bed without slough. 3x3 open area to skin (Active)  02/04/22 0430  Location: Sacrum  Location Orientation: Medial  Staging: Stage 2 -  Partial thickness loss of dermis presenting as a shallow open injury  with a red, pink wound bed without slough.  Wound Description (Comments): 3x3 open area to skin  Present on Admission: Yes  Dressing Type Foam  - Lift dressing to assess site every shift 02/08/22 0841    DVT prophylaxis:enoxaparin (LOVENOX) injection 30 mg Start: 02/08/22 2200     Code Status: Full Code  Family Communication: Daughter at bedside 9/6, 9/8  Patient status: Inpatient  Patient is from : ALF  Anticipated discharge to: SNF  Estimated DC date: Requires 10-day isolation for COVID-positive   Consultants: Cardiology  Procedures:None  Antimicrobials:     Subjective: Patient awake sitting up in recliner, daughter at bedside.  Pt has no acute complaints and denies feeling dizzy, lightheaded, short of breath of having pain.  Daughter wants to be sure sacral wound pt had from before coming in is being monitored.    Objective: Vitals:   02/08/22 1201 02/08/22 1204 02/08/22 1416 02/08/22 1618  BP: (!) 111/37 (!) 100/35 (!) 119/42 (!) 134/49  Pulse:  (!) 43 (!) 47 (!) 41  Resp:   16 16  Temp:  97.8 F (36.6 C) 98.4 F (36.9 C) 97.8 F (36.6 C)  TempSrc:      SpO2:  100% 98% 100%  Weight:      Height:       Filed Weights   02/03/22 2105 02/04/22 0406  Weight: 77.1 kg 77 kg    Examination:  General exam: awake, alert, no acute distress HEENT: moist mucus membranes, hearing grossly normal  Respiratory system: On room air, normal respiratory effort. Cardiovascular system: normal S1/S2, bradycardic, no pedal edema.   Central nervous system: A&O x. no gross focal neurologic deficits, normal speech Extremities: moves all, no edema, normal tone Skin: dry, intact, normal temperature Psychiatry: normal mood, congruent affect   Data Reviewed:    Labs notable for --- Cr 1.13 >> 1.24 >> 1.55 (after hypotensive yesterday), albumin 3.1, glucose 122, BUN 50         LOS: 4 days   Ezekiel Slocumb, DO [facility] P9/01/2022, 4:47 PM

## 2022-02-09 DIAGNOSIS — U071 COVID-19: Secondary | ICD-10-CM | POA: Diagnosis not present

## 2022-02-09 LAB — CBC
HCT: 28.9 % — ABNORMAL LOW (ref 36.0–46.0)
Hemoglobin: 9.1 g/dL — ABNORMAL LOW (ref 12.0–15.0)
MCH: 28.3 pg (ref 26.0–34.0)
MCHC: 31.5 g/dL (ref 30.0–36.0)
MCV: 89.8 fL (ref 80.0–100.0)
Platelets: 224 10*3/uL (ref 150–400)
RBC: 3.22 MIL/uL — ABNORMAL LOW (ref 3.87–5.11)
RDW: 13.3 % (ref 11.5–15.5)
WBC: 4.6 10*3/uL (ref 4.0–10.5)
nRBC: 0 % (ref 0.0–0.2)

## 2022-02-09 LAB — BASIC METABOLIC PANEL
Anion gap: 7 (ref 5–15)
BUN: 47 mg/dL — ABNORMAL HIGH (ref 8–23)
CO2: 23 mmol/L (ref 22–32)
Calcium: 9.9 mg/dL (ref 8.9–10.3)
Chloride: 109 mmol/L (ref 98–111)
Creatinine, Ser: 1.36 mg/dL — ABNORMAL HIGH (ref 0.44–1.00)
GFR, Estimated: 37 mL/min — ABNORMAL LOW (ref >60)
Glucose, Bld: 127 mg/dL — ABNORMAL HIGH (ref 70–99)
Potassium: 4.6 mmol/L (ref 3.5–5.1)
Sodium: 139 mmol/L (ref 135–145)

## 2022-02-09 LAB — GLUCOSE, CAPILLARY
Glucose-Capillary: 114 mg/dL — ABNORMAL HIGH (ref 70–99)
Glucose-Capillary: 194 mg/dL — ABNORMAL HIGH (ref 70–99)
Glucose-Capillary: 145 mg/dL — ABNORMAL HIGH (ref 70–99)
Glucose-Capillary: 134 mg/dL — ABNORMAL HIGH (ref 70–99)

## 2022-02-09 LAB — CULTURE, BLOOD (ROUTINE X 2)
Culture: NO GROWTH
Culture: NO GROWTH

## 2022-02-09 LAB — TROPONIN I (HIGH SENSITIVITY)
Troponin I (High Sensitivity): 10 ng/L (ref <18)
Troponin I (High Sensitivity): 11 ng/L (ref <18)

## 2022-02-09 MED ORDER — ATROPINE SULFATE 1 MG/10ML IJ SOSY
0.5000 mg | PREFILLED_SYRINGE | Freq: Once | INTRAMUSCULAR | Status: AC
  Administered 2022-02-09: 0.5 mg via INTRAVENOUS

## 2022-02-09 MED ORDER — SODIUM CHLORIDE 0.9 % IV BOLUS
250.0000 mL | Freq: Once | INTRAVENOUS | Status: AC
  Administered 2022-02-09: 250 mL via INTRAVENOUS

## 2022-02-09 NOTE — Progress Notes (Signed)
Notified Dr. Arbutus Ped of multiple sustained HRs in the 20s for approximately 30 seconds and a few (2-3 times) asystole events of 5 seconds or less.  Patient exam remains the same.  Patient asymptomatic.  Discussed parameters for notification. If patient has sustained HR of less than 30 bpm for approximately 60 seconds or more, central tele to notify RN staff and RN to assess and notify MD.

## 2022-02-09 NOTE — Progress Notes (Signed)
Around 1200 called rapid response. This Probation officer and rapid response team received orders from Dr. Jasmine Pang who was present at that time in patient's room to give atropine, 258m bolus, and to obtain an EKG. Patient was transferred to 2A once room became available. .Marland Kitchen

## 2022-02-09 NOTE — Significant Event (Signed)
Rapid Response Event Note   Reason for Call : called RRT for bradycardia reported in the 20's by CCMD   Initial Focused Assessment: sitting up in bed, HR on tele in 30's... pt asymptomatic. Dr Arbutus Ped at bedside.      Interventions: atropine given, bolus ordered, stat ekg, and transfer to PCU. Dr Arbutus Ped to notify cardiology.   Plan of Care: as above, RN Malka    Event Summary: as above  MD Notified: [facility] [address] [place] A, RN

## 2022-02-09 NOTE — Progress Notes (Signed)
Martha Walker       Patient ID: Martha Walker MRN: [id number] DOB/AGE: Dec 22, 1931 88 y.o.  Admit date: 02/03/2022 Referring Physician Dr. Tawanna Solo Primary Physician Dr. Ola Spurr  Primary Cardiologist Dr. Nehemiah Massed  Reason for Consultation bradycardia   HPI: Martha Walker is an 76yoF with a PMH of dementia, CKD 3, type 2 diabetes, hyperlipidemia, hypertension, carotid artery stenosis who presented to [facility] ED from her assisted living facility on 02/03/2022 with progressive weakness, N/V/D, and confusion above her baseline.  She was positive for COVID-19 on admission with a revealed significant leukocytes for which acute cystitis was suspected.  During her hospitalization she has been bradycardic with heart rate between 30s - 60s for which cardiology is consulted for further assistance.  Interval History: -reconsulted today after notified by nursing and hospitalist she was having more pauses on telemetry as well as sinus bradycardia.  The patient is not currently having any significant symptoms although is weak and fatigued with this concerns for previous dementia. Therefore, there does not appear to be any primary concerns need for intervention.  The patient does respond to atropine at times.  In addition to that, the patient may need further discussions with the family as to the long-term care needs in relationship to current bradycardia  Review of systems limited by patient's baseline dementia  Past Medical History:  Diagnosis Date   Ankle edema, bilateral    Atrophic vaginitis    B12 deficiency    Bilateral carotid artery stenosis    Chronic anemia    CKD stage 3 due to type 2 diabetes mellitus ([place])    Dementia ([place])    Diabetes mellitus without complication ([place])    Diverticulitis    Hyperlipidemia    Hypertension    Osteoporosis    PVD (peripheral vascular disease) ([place])    Vitamin D deficiency     Past Surgical History:  Procedure Laterality Date    CATARACT EXTRACTION W/PHACO Right 02/21/2021   Procedure: CATARACT EXTRACTION PHACO AND INTRAOCULAR LENS PLACEMENT ([place]) RIGHT KAHOOK DUAL BLADE GONIOTOMY;  Surgeon: Leandrew Koyanagi, MD;  Location: [facility];  Service: Ophthalmology;  Laterality: Right;  12.45 01:20.1    Medications Prior to Admission  Medication Sig Dispense Refill Last Dose   acetaminophen (TYLENOL) 325 MG tablet Take by mouth.   prn at prn   ammonium lactate (AMLACTIN) 12 % cream Apply 1 Application topically as directed.   unknown at unknown   brimonidine (ALPHAGAN) 0.2 % ophthalmic solution Place 1 drop into both eyes 2 (two) times daily.   unknown at unknown   cetirizine (ZYRTEC) 10 MG tablet Take 10 mg by mouth daily.   unknown at unknown   Cholecalciferol 1.25 MG (50000 UT) capsule Take 50,000 Units by mouth every 7 (seven) days.   unknown at unknown   diclofenac Sodium (VOLTAREN) 1 % GEL Apply 2 g topically 4 (four) times daily as needed.   prn at prn   docusate sodium (COLACE) 100 MG capsule Take 100 mg by mouth daily.   unknown at unknown   dorzolamide-timolol (COSOPT) 22.3-6.8 MG/ML ophthalmic solution 1 drop 2 (two) times daily.   unknown at unknown   FARXIGA 10 MG TABS tablet Take 10 mg by mouth daily.   unknown at unknown   GAVILAX 17 GM/SCOOP powder Take by mouth.   unknown at unknown   hydrALAZINE (APRESOLINE) 25 MG tablet Take 25 mg by mouth 2 (two) times daily.   unknown at unknown  ketoconazole (NIZORAL) 2 % cream Apply 1 Application topically as directed. Apply as directed once or twice daily.   unknown at unknown   ketoconazole (NIZORAL) 2 % shampoo Massage into scalp and let sit 10 minutes before washing out twice weekly. 120 mL 2 unknown at unknown   latanoprost (XALATAN) 0.005 % ophthalmic solution Place 1 drop into both eyes at bedtime.   unknown at unknown   Latanoprostene Bunod (VYZULTA) 0.024 % SOLN Apply to eye.   unknown at unknown   loperamide (IMODIUM) 2 MG capsule Take 2 mg by  mouth as needed for diarrhea or loose stools.   prn at prn   magnesium hydroxide (MILK OF MAGNESIA) 400 MG/5ML suspension Take 30 mLs by mouth daily as needed for mild constipation.   prn at prn   memantine (NAMENDA) 5 MG tablet Take 5 mg by mouth 2 (two) times daily.   unknown at unknown   metFORMIN (GLUCOPHAGE) 500 MG tablet Take 500 mg by mouth 2 (two) times daily with a meal.   unknown at unknown   mirabegron ER (MYRBETRIQ) 50 MG TB24 tablet Take 1 tablet (50 mg total) by mouth daily. 30 tablet 11 unknown at unknown   rosuvastatin (CRESTOR) 10 MG tablet Take 10 mg by mouth daily.   unknown at unknown   SENNA-TABS 8.6 MG tablet Take by mouth daily as needed.   prn at prn   triamcinolone ointment (KENALOG) 0.1 % Apply to affected areas twice daily as needed for itchy rash at back. Avoid applying to face, groin, and axilla. Use as directed. Risk of skin atrophy with long-term use reviewed. 80 g 2 prn at prn   valsartan (DIOVAN) 80 MG tablet Take 80 mg by mouth daily.   unknown at unknown   vitamin B-12 (CYANOCOBALAMIN) 1000 MCG tablet Take 1,000 mcg by mouth daily.   unknown at unknown   alum & mag hydroxide-simeth (MAALOX/MYLANTA) 200-200-20 MG/5ML suspension Take 30 mLs by mouth every 6 (six) hours as needed for indigestion or heartburn.   prn at prn   Blood Glucose Monitoring Suppl (FIFTY50 GLUCOSE METER 2.0) w/Device KIT Use as directed. DX:E11.9      Social History   Socioeconomic History   Marital status: Widowed    Spouse name: Not on file   Number of children: Not on file   Years of education: Not on file   Highest education level: Not on file  Occupational History   Not on file  Tobacco Use   Smoking status: Never   Smokeless tobacco: Never  Vaping Use   Vaping Use: Never used  Substance and Sexual Activity   Alcohol use: Never   Drug use: Never   Sexual activity: Not on file  Other Topics Concern   Not on file  Social History Narrative   Lives at assisted living  facility- [facility].   Social Determinants of Health   Financial Resource Strain: Not on file  Food Insecurity: Not on file  Transportation Needs: Not on file  Physical Activity: Not on file  Stress: Not on file  Social Connections: Not on file  Intimate Partner Violence: Not on file    No family history on file.    PHYSICAL EXAM General: Elderly caucasian female, in no acute distress. Sitting upright in hospital bed with daughter at bedside HEENT:  Normocephalic and atraumatic. Neck:  No JVD.  Lungs: Normal respiratory effort on room air. Decreased breath sounds bilaterally without appreciable crackles or wheezes Heart: bradycardic but  regular Normal S1 and S2 without gallops or murmurs.  Abdomen: Non-distended appearing.  Msk: Normal strength and tone for age. Extremities: Warm and well perfused. No clubbing, cyanosis. No peripheral edema.  Neuro: Alert and oriented  Psych:  Answers questions appropriately.   Labs: Basic Metabolic Panel: Recent Labs    02/08/22 0534 02/09/22 0521  NA 140 139  K 4.2 4.6  CL 111 109  CO2 23 23  GLUCOSE 122* 127*  BUN 50* 47*  CREATININE 1.55* 1.36*  CALCIUM 9.5 9.9    Liver Function Tests: Recent Labs    02/07/22 0534 02/08/22 0534  AST 25 29  ALT 10 13  ALKPHOS 51 61  BILITOT 0.4 0.4  PROT 6.0* 6.0*  ALBUMIN 3.1* 3.1*    No results for input(s): "LIPASE", "AMYLASE" in the last 72 hours. CBC: Recent Labs    02/09/22 1400  WBC 4.6  HGB 9.1*  HCT 28.9*  MCV 89.8  PLT 224    Cardiac Enzymes: Recent Labs    02/09/22 1400  TROPONINIHS 10    BNP: Invalid input(s): "POCBNP" D-Dimer: No results for input(s): "DDIMER" in the last 72 hours.  Hemoglobin A1C: No results for input(s): "HGBA1C" in the last 72 hours. Fasting Lipid Panel: No results for input(s): "CHOL", "HDL", "LDLCALC", "TRIG", "CHOLHDL", "LDLDIRECT" in the last 72 hours. Thyroid Function Tests: No results for input(s): "TSH", "T4TOTAL", "T3FREE",  "THYROIDAB" in the last 72 hours.  Invalid input(s): "FREET3" Anemia Panel: No results for input(s): "VITAMINB12", "FOLATE", "FERRITIN", "TIBC", "IRON", "RETICCTPCT" in the last 72 hours.   No results found.   Radiology: CT ABDOMEN PELVIS WO CONTRAST  Result Date: 02/03/2022 CLINICAL DATA:  Abdominal pain, acute, nonlocalized EXAM: CT ABDOMEN AND PELVIS WITHOUT CONTRAST TECHNIQUE: Multidetector CT imaging of the abdomen and pelvis was performed following the standard protocol without IV contrast. RADIATION DOSE REDUCTION: This exam was performed according to the departmental dose-optimization program which includes automated exposure control, adjustment of the mA and/or kV according to patient size and/or use of iterative reconstruction technique. COMPARISON:  None Available. FINDINGS: Lower chest: Moderate right coronary artery calcification. Global cardiac size within normal limits. No acute abnormality. Hepatobiliary: Cholelithiasis without pericholecystic inflammatory change. Liver unremarkable on this noncontrast examination. No intra or extrahepatic biliary ductal dilation. Pancreas: Unremarkable Spleen: Unremarkable Adrenals/Urinary Tract: Adrenal glands are unremarkable. Kidneys are normal, without renal calculi, focal lesion, or hydronephrosis. Bladder is unremarkable. Stomach/Bowel: Severe distal colonic diverticulosis involving the distal transverse, descending, and sigmoid colon. No superimposed acute inflammatory change. Stomach, small bowel, and large bowel are unremarkable. Appendix normal. No free intraperitoneal gas or fluid. Vascular/Lymphatic: Extensive aortoiliac atherosclerotic calcification. No aortic aneurysm. Particularly prominent atherosclerotic calcification involving the mesenteric and renal artery origins, however, the degree of stenosis is not well assessed on this noncontrast examination. No pathologic adenopathy within the abdomen and pelvis. Reproductive: Status post  hysterectomy. No adnexal masses. Other: No abdominal wall hernia.  Rectum unremarkable. Musculoskeletal: Remote inferior endplate fracture G95 with no significant loss of height. No acute bone abnormality. Degenerative changes are seen within the lumbar spine. No lytic or blastic bone lesion. IMPRESSION: 1. No acute intra-abdominal pathology identified. No definite radiographic explanation for the patient's reported symptoms. 2. Moderate right coronary artery calcification. 3. Cholelithiasis. 4. Severe distal colonic diverticulosis without superimposed acute inflammatory change. 5. Peripheral vascular disease. Suspected hemodynamically significant mesenteric and renal artery stenoses. If there is clinical evidence of chronic mesenteric ischemia or hemodynamically significant renal artery stenosis, CT arteriography may be more  helpful for further evaluation. Aortic Atherosclerosis (ICD10-I70.0). Electronically Signed   By: Fidela Salisbury M.D.   On: 02/03/2022 23:26   CT HEAD WO CONTRAST (5MM)  Result Date: 02/03/2022 CLINICAL DATA:  Head trauma, minor (Age >= 65y). Altered mental status. EXAM: CT HEAD WITHOUT CONTRAST TECHNIQUE: Contiguous axial images were obtained from the base of the skull through the vertex without intravenous contrast. RADIATION DOSE REDUCTION: This exam was performed according to the departmental dose-optimization program which includes automated exposure control, adjustment of the mA and/or kV according to patient size and/or use of iterative reconstruction technique. COMPARISON:  None Available. FINDINGS: Brain: Normal anatomic configuration. Parenchymal volume loss is commensurate with the patient's age. Mild periventricular white matter changes are present likely reflecting the sequela of small vessel ischemia. Remote lacunar infarct within the right thalamus and left globus pallidus. No abnormal intra or extra-axial mass lesion or fluid collection. No abnormal mass effect or midline  shift. No evidence of acute intracranial hemorrhage or infarct. Ventricular size is normal. Cerebellum unremarkable. Vascular: No asymmetric hyperdense vasculature at the skull base. Extensive atherosclerotic calcification within the carotid siphons. Skull: Intact Sinuses/Orbits: Paranasal sinuses are clear. Orbits are unremarkable. Other: Left mastoidectomy has been performed. Additionally, the auditory ossicles are absent suggesting prior resection. Debris or packing material is noted within the resection bed. Right mastoid air cells and middle ear cavity is clear. IMPRESSION: 1. No acute intracranial abnormality. 2. Mild senescent change and remote lacunar infarcts. 3. Postsurgical changes involving the left mastoid air cells and middle ear cavity. Debris versus residual packing material within the a resection bed. Electronically Signed   By: Fidela Salisbury M.D.   On: 02/03/2022 23:17   DG Chest Portable 1 View  Result Date: 02/03/2022 CLINICAL DATA:  Sob.  Recent covid. Cough, sob, nausea, vomiting. EXAM: PORTABLE CHEST 1 VIEW COMPARISON:  Chest x-ray 06/10/2021 FINDINGS: The heart and mediastinal contours are unchanged. Aortic calcification. No focal consolidation. No pulmonary edema. No pleural effusion. No pneumothorax. No acute osseous abnormality. Right shoulder rotator cuff anchor suture. IMPRESSION: 1. No active disease. 2.  Aortic Atherosclerosis (ICD10-I70.0). Electronically Signed   By: Iven Finn M.D.   On: 02/03/2022 22:45    ECHO 05/10/2019 IMPRESSIONS    1. Left ventricular ejection fraction, by visual estimation, is 60 to  65%. The left ventricle has normal function. There is no left ventricular  hypertrophy.   2. Global right ventricle has normal systolic function.The right  ventricular size is normal. No increase in right ventricular wall  thickness.   3. Left atrial size was normal.   4. Right atrial size was normal.   5. The mitral valve is normal in structure. Mild to  moderate mitral valve  regurgitation. No evidence of mitral stenosis.   6. The tricuspid valve is normal in structure. Tricuspid valve  regurgitation is mild.   7. The aortic valve is normal in structure. Aortic valve regurgitation is  not visualized. No evidence of aortic valve sclerosis or stenosis.   8. The pulmonic valve was normal in structure. Pulmonic valve  regurgitation is not visualized.   9. Mildly elevated pulmonary artery systolic pressure.  10. The inferior vena cava is normal in size with greater than 50%  respiratory variability, suggesting right atrial pressure of 3 mmHg.   FINDINGS   Left Ventricle: Left ventricular ejection fraction, by visual estimation,  is 60 to 65%. The left ventricle has normal function. There is no left  ventricular hypertrophy. Normal left atrial  pressure.   Right Ventricle: The right ventricular size is normal. No increase in  right ventricular wall thickness. Global RV systolic function is has  normal systolic function. The tricuspid regurgitant velocity is 2.67 m/s,  and with an assumed right atrial pressure   of 10 mmHg, the estimated right ventricular systolic pressure is mildly  elevated at 38.5 mmHg.   Left Atrium: Left atrial size was normal in size.   Right Atrium: Right atrial size was normal in size   Pericardium: There is no evidence of pericardial effusion.   Mitral Valve: The mitral valve is normal in structure. No evidence of  mitral valve stenosis by observation. MV peak gradient, 4.0 mmHg. Mild to  moderate mitral valve regurgitation.   Tricuspid Valve: The tricuspid valve is normal in structure. Tricuspid  valve regurgitation is mild.   Aortic Valve: The aortic valve is normal in structure. Aortic valve  regurgitation is not visualized. The aortic valve is structurally normal,  with no evidence of sclerosis or stenosis. Aortic valve mean gradient  measures 4.0 mmHg. Aortic valve peak  gradient measures 8.5 mmHg.  Aortic valve area, by VTI measures 1.59 cm.   Pulmonic Valve: The pulmonic valve was normal in structure. Pulmonic valve  regurgitation is not visualized.   Aorta: The aortic root, ascending aorta and aortic arch are all  structurally normal, with no evidence of dilitation or obstruction.   Venous: The inferior vena cava is normal in size with greater than 50%  respiratory variability, suggesting right atrial pressure of 3 mmHg.   IAS/Shunts: No atrial level shunt detected by color flow Doppler. There is  no evidence of a patent foramen ovale. No ventricular septal defect is  seen or detected. There is no evidence of an atrial septal defect.      LEFT VENTRICLE  PLAX 2D  LVIDd:         4.95 cm  Diastology  LVIDs:         3.13 cm  LV e' lateral:   9.03 cm/s  LV PW:         0.99 cm  LV E/e' lateral: 9.5  LV IVS:        0.66 cm  LV e' medial:    4.03 cm/s  LVOT diam:     1.80 cm  LV E/e' medial:  21.3  LV SV:         77 ml  LV SV Index:   37.33  LVOT Area:     2.54 cm      LEFT ATRIUM             Index  LA diam:        3.70 cm 1.85 cm/m  LA Vol (A2C):   44.6 ml 22.30 ml/m  LA Vol (A4C):   70.1 ml 35.06 ml/m  LA Biplane Vol: 56.6 ml 28.30 ml/m   AORTIC VALVE                   PULMONIC VALVE  AV Area (Vmax):    1.80 cm    PV Vmax:       1.05 m/s  AV Area (Vmean):   1.71 cm    PV Vmean:      73.500 cm/s  AV Area (VTI):     1.59 cm    PV VTI:        0.263 m  AV Vmax:           146.00 cm/s  PV Peak grad:  4.4 mmHg  AV Vmean:          91.200 cm/s PV Mean grad:  2.0 mmHg  AV VTI:            0.391 m  AV Peak Grad:      8.5 mmHg  AV Mean Grad:      4.0 mmHg  LVOT Vmax:         103.00 cm/s  LVOT Vmean:        61.400 cm/s  LVOT VTI:          0.244 m  LVOT/AV VTI ratio: 0.62     AORTA  Ao Root diam: 3.00 cm   MITRAL VALVE                        TRICUSPID VALVE  MV Area (PHT): 2.60 cm             TR Peak grad:   28.5 mmHg  MV Peak grad:  4.0 mmHg             TR Vmax:         267.00 cm/s  MV Mean grad:  1.0 mmHg  MV Vmax:       1.00 m/s             SHUNTS  MV Vmean:      55.1 cm/s            Systemic VTI:  0.24 m  MV VTI:        0.37 m               Systemic Diam: 1.80 cm  MV PHT:        84.68 msec  MV Decel Time: 292 msec  MV E velocity: 85.90 cm/s 103 cm/s  MV A velocity: 69.40 cm/s 70.3 cm/s  MV E/A ratio:  1.24       1.5      Isaias Cowman MD  Electronically signed by Isaias Cowman MD  Signature Date/Time: 05/11/2019/1:17:54 PM   TELEMETRY reviewed by me (LT) 02/09/2022 : Sinus bradycardia with occasional PACs, noted a 2 instances of 3.8-second pauses on telemetry today, with occasional 1 to 2-second pauses without evidence of high degree AV block or atrial fibrillation  EKG reviewed by me: Sinus bradycardia with PACs RBBB rate 50  Data reviewed by me (LT) 02/09/2022: Hospitalist progress Walker, nursing notes, CBC, BMP, D-dimer, telemetry, vitals, discussed with hospitalist and nursing  ASSESSMENT AND PLAN:   Martha Walker is an 52yoF with a PMH of dementia, CKD 3, type 2 diabetes, hyperlipidemia, hypertension, carotid artery stenosis who presented to [facility] ED from her assisted living facility on 02/03/2022 with progressive weakness, N/V/D, and confusion above her baseline.  She was positive for COVID-19 on admission with a revealed significant leukocytes for which acute cystitis was suspected.   The patient continues to have sinus bradycardia without apparent symptoms of the sinus bradycardia and continuation of dementia and need for assessment and need for further intervention.  Would only proceed to pacemaker placement if patient had further discussion with the family about long-term care  #Acute cystitis #COVID-19 #Asymptomatic sinus bradycardia The patient presented from her assisted living facility with generalized weakness, nausea, vomiting, diarrhea, and confusion above her baseline dementia.  UA consistent with acute cystitis, incidentally  COVID-19 positive on admission.  Telemetry during admission has shown sinus bradycardia with heart rates in the mid 40s mostly,  appropriately increasing to the 50s and 60s with activity.  There have been 2 instances of 3.8-second pauses the afternoon of 9/7, additionally now the patient has sinus bradycardia sometimes in the 30 to 40 bpm range -Agree with current therapy for treatment of UTI and COVID-19 -Continuous monitoring on telemetry while inpatient although would like to see symptomatic issues if needed to be intervened upon -Avoid beta-blockers, other AV nodal blocking agents -Monitor and replete electrolytes as needed to maintain K >4, mag >2 -No indication for permanent pacemaker at this time. -May need further discussion with family    Signed: Corey Skains ,   02/09/2022, 4:19 PM [facility]

## 2022-02-09 NOTE — Progress Notes (Signed)
Patient had a 4.6 second asystole event and current HR of 29-35 after being transferred to 2A.  Dr. Arbutus Ped and Dr. Nehemiah Massed notified.

## 2022-02-09 NOTE — Progress Notes (Signed)
  Chaplain On-Call responded to Rapid Response notification at 1204 hours.  Patient was receiving active care from medical team.  Chaplain is available for additional support if requested.  Chaplain Pollyann Samples M.Div., [facility]

## 2022-02-09 NOTE — Progress Notes (Addendum)
PROGRESS NOTE  Martha Walker  WCB:[id number] DOB: 06/30/1931 DOA: 02/03/2022 PCP: Leonel Ramsay, MD   Brief Narrative: Patient is a 88 year old female with history of carotid stenosis, CKD stage IIIa, diabetes type 2, hyperlipidemia, hypertension, peripheral vascular disease who presented from ALF with complaint of progressive weakness, nausea, vomiting, diarrhea, confusion.  On presentation she was hemodynamically stable, on room air.  Lab work showed creatinine of 2.15, baseline creatinine around 1.3.  COVID screen test positive.  UA showed significant leukocytes, UTI suspected, started on ceftriaxone, urine culture sent.  Hospital course remarkable for intermittent  bradycardia.  Cardiology consulted, no plan for intervention.  PT/OT consulted for weakness.  Assessment & Plan:  Principal Problem:   COVID-19 Active Problems:   Bradycardia   Pressure injury of skin  COVID infection: Presented with nausea, vomiting, diarrhea, weakness. Suspected  COVID gastroenteritis.  The symptoms have resolved now.  Continue molnupiravir.   Continue airborne and contact precautions. On 10 day isolation to go to SNF/rehab.   UTI suspected / Lactobacillus on urine culture:  UA was suggestive  of UTI with some few leukocytes, but unclear if pt symptomatic due to dementia. Treated with 3 days empiric ceftriaxone.  Urine culture grew lactobacillus spp, doubt pathogenic but colonization.  Pt is afebrile, no leukocytosis.  Monitor clinically, consider repeat cutlure and start antibiotic if s/sx's of infection.   Bradycardia: She is mostly asymptomatic.  Patient heart rate runs low to 40.  Heart rate fluctuating between 40-70.  Central telemetry has reported occasional pauses of about 2 seconds.  Per chart review, during last admission, she was seen by Dr. 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  Cardiology consulted, recommending conservative management.  Monitor on telemetry.  Avoid AV  nodal blocker agents.  Electrolytes stable.  9/9 -- rapid called, HR dipping into 20's.  Given 0.5 mg atropine, will have PRN available if needed Cardiology updated and will see her today.  No indication for temporary pacing given otherwise stable, per cardiology.    Confusion/Dementia : Likely from metabolic encephalopathy from UTI, COVID.  As per the daughter, she is intermittently confused, she likely has some underlying dementia / cognitive impairment. Takes memantine. Continue supportive care, frequent reorientation, delirium precautions.  CT head showed remote lacunar infarcts, no acute findings   AKI in CKD stage IIIa: Baseline creatinine 1.3.  Presented with creatinine in the range of 2.  Now back to baseline with IV fluid, IV fluid discontinued   Bilateral carotid stenosis: On Plavix, statin   Diabetes type 2: On metformin at home.  Continue sliding scale insulin.  Blood sugars   Hyperlipidemia: On statin at home   Hypotension: noted on 9/7.   We have d/c'd antihypertensives for now. Monitor BP closely.  Maintain MAP>65 with fluids if needed.   Given small fluid bolus on 9/7.  Hypertension: Currently blood pressure stable.  Hold Hydralazine.  ARB was held due to AKI, now held for soft BP's   Peripheral vascular disease: CT abdomen/pelvis showed hemodynamically significant mesenteric and renal artery stenoses.  Further work-up as an outpatient, cannot do contrast studies like CT angiogram due  her kidney function.  Questionable significance of further work-up due to her advanced age  Weakness/disposition: Patient is from ALF.  PT/OT consulted, SNF recommended.  TOC following for placement.   Stage II sacral wound - POA.  I agree with the wound description as outlined below. Continue diligent wound care, frequent repositioning and monitor site closely for signs  of infection.   Pressure Injury 02/04/22 Sacrum Medial Stage 2 -  Partial thickness loss of dermis presenting as a  shallow open injury with a red, pink wound bed without slough. 3x3 open area to skin (Active)  02/04/22 0430  Location: Sacrum  Location Orientation: Medial  Staging: Stage 2 -  Partial thickness loss of dermis presenting as a shallow open injury with a red, pink wound bed without slough.  Wound Description (Comments): 3x3 open area to skin  Present on Admission: Yes  Dressing Type Foam - Lift dressing to assess site every shift 02/09/22 1315    DVT prophylaxis:enoxaparin (LOVENOX) injection 30 mg Start: 02/08/22 2200     Code Status: Full Code  Family Communication: Daughter at bedside 9/6, 9/8  Patient status: Inpatient  Patient is from : ALF  Anticipated discharge to: SNF  Estimated DC date: Requires 10-day isolation for COVID-positive   Consultants: Cardiology  Procedures:None    Subjective: Patient had rapid response called this AM for bradycardia worsening, dropping into 20's multiple times per central tele.  Her BP was stable and she denied chest pain, SOB, dizziness or lightheadedness.  Pt said she was feeling okay.  She denied fever/chills, N/V, dysuria or abdominal pain.  HR improved with atropine into 50's.  She was transferred to PCU for closer monitoring and cardiology was updated.  Given she was asymptomatic and hemodynamically stable, temporary pacing was not recommended.     Objective: Vitals:   02/09/22 1146 02/09/22 1207 02/09/22 1230 02/09/22 1317  BP:  (!) 122/48 (!) 109/96 (!) 113/59  Pulse:  (!) 35 (!) 43 (!) 43  Resp:  16  18  Temp: (!) 96.6 F (35.9 C)     TempSrc: Axillary     SpO2:  100%  98%  Weight:      Height:       Filed Weights   02/03/22 2105 02/04/22 0406  Weight: 77.1 kg 77 kg    Examination:  General exam: awake, alert, no acute distress, baseline confusion HEENT: moist mucus membranes, hearing grossly normal  Respiratory system: lungs CTAB, on room air, normal respiratory effort. Cardiovascular system: normal S1/S2,  bradycardic, no pedal edema, cool distal lower extremities but 2+ pedal pulses.   Central nervous system: A&O x self and hospital. no gross focal neurologic deficits, normal speech Extremities: moves all, no edema, normal tone Skin: dry, intact, normal temperature Psychiatry: normal mood, congruent affect   Data Reviewed:    Labs notable for --- Cr improved 1.55 >> 1.36 (rise was likely due to hypotension the day before), glucose 127, GFR 37, Hbg 9.1, troponin 10, WBC normal 4.6k     Time spent - 55 minutes including time spent at bedside and in coordination of care.      LOS: 5 days   Ezekiel Slocumb, DO [facility] P9/02/2022, 4:41 PM

## 2022-02-09 NOTE — Progress Notes (Signed)
Per CCMD patient's HR intermittently sustaining in the 30's for 30seconds-1 minute. Per CCMD HR was down to 20 not sustaining. Mews score 2 d/t decreased HR and temp 96.8. Room temp was at 60 degrees. This writer increased the room temp. Patient at NAD. Notified charge nurse. Called rapid response and notified Dr. Arbutus Ped.

## 2022-02-10 DIAGNOSIS — U071 COVID-19: Secondary | ICD-10-CM | POA: Diagnosis not present

## 2022-02-10 LAB — BASIC METABOLIC PANEL
Anion gap: 6 (ref 5–15)
BUN: 48 mg/dL — ABNORMAL HIGH (ref 8–23)
CO2: 25 mmol/L (ref 22–32)
Calcium: 9.4 mg/dL (ref 8.9–10.3)
Chloride: 111 mmol/L (ref 98–111)
Creatinine, Ser: 1.38 mg/dL — ABNORMAL HIGH (ref 0.44–1.00)
GFR, Estimated: 37 mL/min — ABNORMAL LOW (ref >60)
Glucose, Bld: 137 mg/dL — ABNORMAL HIGH (ref 70–99)
Potassium: 4.6 mmol/L (ref 3.5–5.1)
Sodium: 142 mmol/L (ref 135–145)

## 2022-02-10 LAB — CBC
HCT: 27.5 % — ABNORMAL LOW (ref 36.0–46.0)
Hemoglobin: 8.8 g/dL — ABNORMAL LOW (ref 12.0–15.0)
MCH: 28.9 pg (ref 26.0–34.0)
MCHC: 32 g/dL (ref 30.0–36.0)
MCV: 90.2 fL (ref 80.0–100.0)
Platelets: 220 10*3/uL (ref 150–400)
RBC: 3.05 MIL/uL — ABNORMAL LOW (ref 3.87–5.11)
RDW: 13.4 % (ref 11.5–15.5)
WBC: 5.2 10*3/uL (ref 4.0–10.5)
nRBC: 0 % (ref 0.0–0.2)

## 2022-02-10 LAB — GLUCOSE, CAPILLARY
Glucose-Capillary: 121 mg/dL — ABNORMAL HIGH (ref 70–99)
Glucose-Capillary: 148 mg/dL — ABNORMAL HIGH (ref 70–99)
Glucose-Capillary: 124 mg/dL — ABNORMAL HIGH (ref 70–99)
Glucose-Capillary: 165 mg/dL — ABNORMAL HIGH (ref 70–99)

## 2022-02-10 LAB — MAGNESIUM: Magnesium: 2.4 mg/dL (ref 1.7–2.4)

## 2022-02-10 MED ORDER — ATROPINE SULFATE 1 MG/10ML IJ SOSY
0.5000 mg | PREFILLED_SYRINGE | INTRAMUSCULAR | Status: DC | PRN
Start: 2022-02-10 — End: 2022-02-12
  Administered 2022-02-11: 0.5 mg via INTRAVENOUS
  Filled 2022-02-10: qty 10

## 2022-02-10 MED ORDER — ORAL CARE MOUTH RINSE: 15.0000 mL | OROMUCOSAL | Status: DC | PRN

## 2022-02-10 NOTE — Progress Notes (Signed)
PROGRESS NOTE  Martha Walker  HCW:[id number] DOB: May 29, 1932 DOA: 02/03/2022 PCP: Leonel Ramsay, MD   Brief Narrative: Patient is a 88 year old female with history of carotid stenosis, CKD stage IIIa, diabetes type 2, hyperlipidemia, hypertension, peripheral vascular disease who presented from ALF with complaint of progressive weakness, nausea, vomiting, diarrhea, confusion.  On presentation she was hemodynamically stable, on room air.  Lab work showed creatinine of 2.15, baseline creatinine around 1.3.  COVID screen test positive.  UA showed significant leukocytes, UTI suspected, started on ceftriaxone, urine culture sent.  Hospital course remarkable for intermittent  bradycardia.  Cardiology consulted, no plan for intervention.  PT/OT consulted for weakness.  Assessment & Plan:  Principal Problem:   COVID-19 Active Problems:   Bradycardia   Pressure injury of skin  COVID infection: Presented with nausea, vomiting, diarrhea, weakness. Suspected  COVID gastroenteritis.  The symptoms have resolved now.  Continue molnupiravir.   Continue airborne and contact precautions. On 10 day isolation to go to SNF/rehab.   UTI suspected / Lactobacillus on urine culture:  UA was suggestive  of UTI with some few leukocytes, but unclear if pt symptomatic due to dementia. Treated with 3 days empiric ceftriaxone.  Urine culture grew lactobacillus spp, doubt pathogenic but colonization.  Pt is afebrile, no leukocytosis.  Monitor clinically, consider repeat cutlure and start antibiotic if s/sx's of infection.   Bradycardia: She is mostly asymptomatic.  Patient heart rate runs low to 40.  Heart rate fluctuating between 40-70.  Central telemetry has reported occasional pauses of about 2 seconds.  Per chart review, during last admission, she was seen by Dr. 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  Cardiology consulted, recommending conservative management.   9/9 -- rapid called, HR  dipping into 20's.  Given 0.5 mg atropine, will have PRN available if needed Cardiology updated and will see her today.  No indication for temporary pacing given otherwise stable, per cardiology.  --PRN Atropine if HR sustains below 30 --Temporary pacer if hemodynamically unstable or symptomatic --[facility] Cardiology following --Monitor on telemetry --Avoid AV nodal blocker agents.     Confusion/Dementia : Likely from metabolic encephalopathy from UTI, COVID.  As per the daughter, she is intermittently confused, she likely has some underlying dementia / cognitive impairment. Takes memantine. Continue supportive care, frequent reorientation, delirium precautions.  CT head showed remote lacunar infarcts, no acute findings   AKI in CKD stage IIIa: Baseline creatinine 1.3.  Presented with creatinine in the range of 2.  Now back to baseline with IV fluid, IV fluid discontinued   Bilateral carotid stenosis: On Plavix, statin   Diabetes type 2: On metformin at home.  Continue sliding scale insulin.  Blood sugars   Hyperlipidemia: On statin at home   Hypotension: noted on 9/7.   We have d/c'd antihypertensives for now. Monitor BP closely.  Maintain MAP>65 with fluids if needed.   Given small fluid bolus on 9/7.  Hypertension: Currently blood pressure stable.  Hold Hydralazine.  ARB was held due to AKI, now held for soft BP's   Peripheral vascular disease: CT abdomen/pelvis showed hemodynamically significant mesenteric and renal artery stenoses.  Further work-up as an outpatient, cannot do contrast studies like CT angiogram due  her kidney function.  Questionable significance of further work-up due to her advanced age  Weakness/disposition: Patient is from ALF.  PT/OT consulted, SNF recommended.  TOC following for placement.   Stage II sacral wound - POA.  I agree with the wound description  as outlined below. Continue diligent wound care, frequent repositioning and monitor site closely for signs  of infection.   Pressure Injury 02/04/22 Sacrum Medial Stage 2 -  Partial thickness loss of dermis presenting as a shallow open injury with a red, pink wound bed without slough. 3x3 open area to skin (Active)  02/04/22 0430  Location: Sacrum  Location Orientation: Medial  Staging: Stage 2 -  Partial thickness loss of dermis presenting as a shallow open injury with a red, pink wound bed without slough.  Wound Description (Comments): 3x3 open area to skin  Present on Admission: Yes  Dressing Type Foam - Lift dressing to assess site every shift 02/10/22 0915    DVT prophylaxis:enoxaparin (LOVENOX) injection 30 mg Start: 02/08/22 2200     Code Status: Full Code  Family Communication: Daughter at bedside 9/6, 9/8  Patient status: Inpatient  Patient is from : ALF  Anticipated discharge to: SNF  Estimated DC date: Requires 10-day isolation for COVID-positive   Consultants: Cardiology  Procedures:None    Subjective: Patient was awake resting in bed, reports feeling okay. She denies having pain or feeling sick.  No acute events reported.  HR"s on tele monitor noted in 60's when I was on the unit.   Objective: Vitals:   02/10/22 0524 02/10/22 0915 02/10/22 1100 02/10/22 1552  BP:  (!) 139/44 (!) 140/46 (!) 170/40  Pulse:  (!) 50 76 65  Resp: '16 15 18 16  '$ Temp:  97.8 F (36.6 C) 97.7 F (36.5 C) 97.9 F (36.6 C)  TempSrc:  Axillary Oral Oral  SpO2:  100% 100% 100%  Weight:      Height:       Filed Weights   02/03/22 2105 02/04/22 0406  Weight: 77.1 kg 77 kg    Examination:  General exam: awake, alert, no acute distress, baseline confusion Respiratory system: on room air, normal respiratory effort. Cardiovascular system: bradycardic, no pedal edema Central nervous system: A&O x self. no gross focal neurologic deficits Extremities: moves all, no edema, normal tone Skin: dry, intact, no rashes seen   Data Reviewed:    Labs notable for --- Cr 1.38, BUN 48, Hbg  8.8 stable, CBG's at goal     Time spent - 35 minutes    LOS: 6 days   Ezekiel Slocumb, DO [facility] P9/03/2022, 7:23 PM

## 2022-02-11 DIAGNOSIS — N3000 Acute cystitis without hematuria: Secondary | ICD-10-CM

## 2022-02-11 DIAGNOSIS — R001 Bradycardia, unspecified: Secondary | ICD-10-CM | POA: Diagnosis not present

## 2022-02-11 DIAGNOSIS — Z515 Encounter for palliative care: Secondary | ICD-10-CM

## 2022-02-11 DIAGNOSIS — U071 COVID-19: Secondary | ICD-10-CM | POA: Diagnosis not present

## 2022-02-11 DIAGNOSIS — Z789 Other specified health status: Secondary | ICD-10-CM

## 2022-02-11 LAB — BASIC METABOLIC PANEL
Anion gap: 3 — ABNORMAL LOW (ref 5–15)
BUN: 37 mg/dL — ABNORMAL HIGH (ref 8–23)
CO2: 26 mmol/L (ref 22–32)
Calcium: 9.8 mg/dL (ref 8.9–10.3)
Chloride: 110 mmol/L (ref 98–111)
Creatinine, Ser: 1.3 mg/dL — ABNORMAL HIGH (ref 0.44–1.00)
GFR, Estimated: 40 mL/min — ABNORMAL LOW (ref >60)
Glucose, Bld: 144 mg/dL — ABNORMAL HIGH (ref 70–99)
Potassium: 4.9 mmol/L (ref 3.5–5.1)
Sodium: 139 mmol/L (ref 135–145)

## 2022-02-11 LAB — GLUCOSE, CAPILLARY
Glucose-Capillary: 133 mg/dL — ABNORMAL HIGH (ref 70–99)
Glucose-Capillary: 207 mg/dL — ABNORMAL HIGH (ref 70–99)
Glucose-Capillary: 155 mg/dL — ABNORMAL HIGH (ref 70–99)

## 2022-02-11 LAB — SURGICAL PCR SCREEN
MRSA, PCR: NEGATIVE
Staphylococcus aureus: NEGATIVE

## 2022-02-11 LAB — CBC
HCT: 30.1 % — ABNORMAL LOW (ref 36.0–46.0)
Hemoglobin: 9.6 g/dL — ABNORMAL LOW (ref 12.0–15.0)
MCH: 29 pg (ref 26.0–34.0)
MCHC: 31.9 g/dL (ref 30.0–36.0)
MCV: 90.9 fL (ref 80.0–100.0)
Platelets: 238 10*3/uL (ref 150–400)
RBC: 3.31 MIL/uL — ABNORMAL LOW (ref 3.87–5.11)
RDW: 13.4 % (ref 11.5–15.5)
WBC: 5 10*3/uL (ref 4.0–10.5)
nRBC: 0 % (ref 0.0–0.2)

## 2022-02-11 LAB — MRSA NEXT GEN BY PCR, NASAL: MRSA by PCR Next Gen: NOT DETECTED

## 2022-02-11 MED ORDER — SODIUM CHLORIDE 0.9 % IV BOLUS
500.0000 mL | Freq: Once | INTRAVENOUS | Status: AC
Start: 2022-02-11 — End: 2022-02-11
  Administered 2022-02-11: 500 mL via INTRAVENOUS

## 2022-02-11 MED ORDER — CHLORHEXIDINE GLUCONATE CLOTH 2 % EX PADS
6.0000 | MEDICATED_PAD | Freq: Every day | CUTANEOUS | Status: DC
  Administered 2022-02-13 – 2022-02-14 (×3): 6 via TOPICAL

## 2022-02-11 MED ORDER — SODIUM CHLORIDE 0.9 % IV SOLN: INTRAVENOUS | Status: DC

## 2022-02-11 NOTE — Discharge Instructions (Signed)
Please avoid showering/submerging yourself in water for the next week. If your bandage gets wet or starts to fall off, replace it with another piece of gauze and the Tegaderm (clear bandage I provided). You should try to keep it covered for a week. You will follow up with Dr. Nehemiah Massed in the office in about 1 week. If you have bleeding from the site, lie down and apply firm pressure for 20 minutes, if it continues to bleed, call our office [phone]). Avoid heavy lifting, squatting (other than sitting) and strenuous activity for 1 week. You will get a call from [employer] to set up your pacemaker and the [employer] device. You do not need to bring this device with you to appointments. It is used to monitor your pacemaker from home.

## 2022-02-11 NOTE — Consult Note (Signed)
Consultation Note Date: 02/11/2022   Patient Name: Martha Walker  DOB: 13-Apr-1932  MRN: [id number]  Age / Sex: 88 y.o., female  PCP: Leonel Ramsay, MD Referring Physician: Ezekiel Slocumb, DO  Reason for Consultation: Establishing goals of care  HPI/Patient Profile: 88 y.o. female  with past medical history of carotid stenosis, CKD (stage IIIa), type 2 diabetes, HTN, PVD, and HLD admitted from the [place] [facility]F on 02/03/2022 with progressive weakness, nausea, vomiting, diarrhea, and confusion.   On admission, patient found to be COVID-positive with creatinine of 2.15.  During course of hospitalization patient has had intermittent bradycardia.  Cardiology consulted and plan for pacemaker set for tomorrow, 9/12.  PMT was consulted to discuss goals of care.  Clinical Assessment and Goals of Care: I have reviewed medical records including EPIC notes, labs and imaging, assessed the patient and then met with patient and her daughter Martha Walker at bedside to discuss diagnosis prognosis, [place], EOL wishes, disposition and options.  I introduced Palliative Medicine as specialized medical care for people living with serious illness. It focuses on providing relief from the symptoms and stress of a serious illness. The goal is to improve quality of life for both the patient and the family.  We discussed a brief life review of the patient.  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  She speaks fondly of her late husband and is proud of her entire family. She cannot state why she is in the hospital but knows she was sick enough to be brought here.  Thus, the majority of our discussion involved her daughter Martha Walker.  As far as functional and nutritional status patient's daughter she was up and walking, able to feed herself, and had no issues with completing ADLs independently.  We discussed patient's current illness and what it  means in the larger context of patient's on-going co-morbidities. I attempted to elicit values and goals of care important to the patient.  Advance directives, concepts specific to code status, artificial feeding and hydration, and rehospitalization were considered and discussed.  Patient has no documented advanced directive or advance care planning documents.  Patient vocalized in the event that she is unable to speak for herself that she want her daughter Martha Walker to make her wishes known.  Martha Walker shares she is wanting her mother to remain a full code. Encouraged patient/family to consider DNR/DNI status understanding evidenced based poor outcomes in similar hospitalized patients, as the cause of the arrest is likely associated with chronic/terminal disease rather than a reversible acute cardio-pulmonary event.    Martha Walker is accepting of all offered, available, and appropriate interventions to sustain her mother's life.  I encouraged her to consider -if patient survives CPR or is placed on a ventilator- how long she would want her mother to remain on the ventilator or be accepting of a trach/PEG.  Martha Walker's shares she is not ready to think about this but appreciates discussion of the "what ifs" as she went through something similar with her father several years ago.  During our discussion, cardiologist spoke with patient and daughter in regards to pacemaker placement set for tomorrow. Patient and daughter in agreement to proceed with pacemaker placement.  Plan is for patient to d/c to SNF for rehab at [facility]. TOC working closely with family.   Discussed with patient/family the importance of continued conversation with family and the medical providers regarding overall plan of care and treatment options, ensuring decisions are within the context of the patient's values and GOCs.    Questions and concerns were addressed. The family was encouraged to call with questions or concerns.  PMT will continue to  follow the patient to support her throughout this hospitalization.  Primary Decision Maker NEXT OF KIN  Code Status/Advance Care Planning: Full code  Prognosis:   Unable to determine  Discharge Planning: SNF for rehab once 10 day Covid19 isolation is completed  Primary Diagnoses: Present on Admission:  COVID-19  Bradycardia   Physical Exam Vitals reviewed.  HENT:     Head: Normocephalic.     Mouth/Throat:     Mouth: Mucous membranes are moist.  Eyes:     Pupils: Pupils are equal, round, and reactive to light.  Cardiovascular:     Rate and Rhythm: Bradycardia present.     Pulses: Normal pulses.  Pulmonary:     Effort: Pulmonary effort is normal.  Abdominal:     Palpations: Abdomen is soft.  Musculoskeletal:     Comments: Generalized weakness  Skin:    General: Skin is warm and dry.  Neurological:     Mental Status: She is alert.     Comments: Oriented to self and place  Psychiatric:        Mood and Affect: Mood normal.        Behavior: Behavior normal.     Palliative Assessment/Data: 40%     Thank you for this consult. Palliative medicine will continue to follow and assist holistically.   Time Total: 75 minutes Greater than 50%  of this time was spent counseling and coordinating care related to the above assessment and plan.  Signed by: Jordan Hawks, DNP, FNP-BC Palliative Medicine    Please contact Palliative Medicine Team phone at [phone] for questions and concerns.  For individual provider: See Martha Walker

## 2022-02-11 NOTE — Progress Notes (Signed)
PROGRESS NOTE  Martha Walker  MMH:[id number] DOB: 1932/01/30 DOA: 02/03/2022 PCP: Leonel Ramsay, MD   Brief Narrative: Patient is a 88 year old female with history of carotid stenosis, CKD stage IIIa, diabetes type 2, hyperlipidemia, hypertension, peripheral vascular disease who presented from ALF with complaint of progressive weakness, nausea, vomiting, diarrhea, confusion.  On presentation she was hemodynamically stable, on room air.  Lab work showed creatinine of 2.15, baseline creatinine around 1.3.  COVID screen test positive.  UA showed significant leukocytes, UTI suspected, started on ceftriaxone, urine culture sent.  Hospital course remarkable for intermittent  bradycardia.  Cardiology consulted, no plan for intervention.  PT/OT consulted for weakness.  Assessment & Plan:  Principal Problem:   COVID-19 Active Problems:   Bradycardia   Pressure injury of skin  COVID infection: Presented with nausea, vomiting, diarrhea, weakness. Suspected  COVID gastroenteritis.  The symptoms have resolved now.  Continue molnupiravir.   Continue airborne and contact precautions. On 10 day isolation to go to SNF/rehab.   UTI suspected / Lactobacillus on urine culture:  UA was suggestive  of UTI with some few leukocytes, but unclear if pt symptomatic due to dementia. Treated with 3 days empiric ceftriaxone.  Urine culture grew lactobacillus spp, doubt pathogenic but colonization.  Pt is afebrile, no leukocytosis.  Monitor clinically, consider repeat cutlure and start antibiotic if s/sx's of infection.   Bradycardia: She is mostly asymptomatic.  Patient heart rate runs low to 40.  Heart rate fluctuating between 40-70.  Central telemetry has reported occasional pauses of about 2 seconds.  Per chart review, during last admission, she was seen by Dr. 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  Cardiology consulted, recommending conservative management.   9/9 -- rapid called, HR  dipping into 20's.  Given 0.5 mg atropine, will have PRN available if needed Cardiology updated and will see her today.  No indication for temporary pacing given otherwise stable, per cardiology.  --PRN Atropine if HR sustains below 30 --Temporary pacer if hemodynamically unstable or symptomatic --[facility] Cardiology following - plan to place PPM possilby tomorrow --Monitor on telemetry --Avoid AV nodal blocker agents.     Confusion/Dementia : Likely from metabolic encephalopathy from UTI, COVID.  As per the daughter, she is intermittently confused, she likely has some underlying dementia / cognitive impairment. Takes memantine. Continue supportive care, frequent reorientation, delirium precautions.  CT head showed remote lacunar infarcts, no acute findings   AKI in CKD stage IIIa: Baseline creatinine 1.3.  Presented with creatinine in the range of 2.  Now back to baseline with IV fluid, IV fluid discontinued   Bilateral carotid stenosis: On Plavix, statin   Diabetes type 2: On metformin at home.  Continue sliding scale insulin.  Blood sugars   Hyperlipidemia: On statin at home   Hypotension: noted on 9/7.   We have d/c'd antihypertensives for now. Monitor BP closely.  Maintain MAP>65 with fluids if needed.   Given small fluid bolus on 9/7.  Hypertension: Currently blood pressure stable.  Hold Hydralazine.  ARB was held due to AKI, now held for soft BP's   Peripheral vascular disease: CT abdomen/pelvis showed hemodynamically significant mesenteric and renal artery stenoses.  Further work-up as an outpatient, cannot do contrast studies like CT angiogram due  her kidney function.  Questionable significance of further work-up due to her advanced age  Weakness/disposition: Patient is from ALF.  PT/OT consulted, SNF recommended.  TOC following for placement.   Stage II sacral wound - POA.  I agree with the wound description as outlined below. Continue diligent wound care, frequent  repositioning and monitor site closely for signs of infection.   Pressure Injury 02/04/22 Sacrum Medial Stage 2 -  Partial thickness loss of dermis presenting as a shallow open injury with a red, pink wound bed without slough. 3x3 open area to skin (Active)  02/04/22 0430  Location: Sacrum  Location Orientation: Medial  Staging: Stage 2 -  Partial thickness loss of dermis presenting as a shallow open injury with a red, pink wound bed without slough.  Wound Description (Comments): 3x3 open area to skin  Present on Admission: Yes  Dressing Type Foam - Lift dressing to assess site every shift 02/11/22 1555    DVT prophylaxis:enoxaparin (LOVENOX) injection 30 mg Start: 02/08/22 2200     Code Status: Full Code  Family Communication: Daughter at bedside 9/6, 9/8  Patient status: Inpatient  Patient is from : ALF  Anticipated discharge to: SNF  Estimated DC date: Requires 10-day isolation for COVID-positive.  Plan for permanent pacemaker placement.    Consultants: Cardiology  Procedures:None    Subjective: Patient was awake resting in bed, daughter at bedside.  Pt has not acute complaints.  With help of palliative NP, we turned pt to capture image of her sacral wound for the chart.  Pt had significant amount of pain with this.  Daughter says plan is for pacemaker to be placed tomorrow.  Pt needed atropine again, transferred to stepdown unit for closer monitoring.    Objective: Vitals:   02/11/22 1555 02/11/22 1600 02/11/22 1700 02/11/22 1800  BP: (!) 146/51 (!) 130/113 (!) 142/79 120/80  Pulse: 64 67 (!) 58 (!) 51  Resp: '16 16 18 16  '$ Temp: (!) 97 F (36.1 C)     TempSrc: Axillary     SpO2: 99% 100% 95% 94%  Weight: 65.4 kg     Height: '5\' 4"'$  (1.626 m)      Filed Weights   02/04/22 0406 02/11/22 0022 02/11/22 1555  Weight: 77 kg 65.7 kg 65.4 kg    Examination:  General exam: awake, alert, no acute distress, baseline confusion Respiratory system: CTAB, on room air,  normal respiratory effort. Cardiovascular system: bradycardic, no pedal edema Central nervous system: A&O x self. no gross focal neurologic deficits Extremities: moves all, no edema, normal tone Skin: dry, intact, no rashes seen, sacral skin injury image below:      Data Reviewed:    Labs notable for --- Cr 1.30, glucose 144, BUN 37, GVR 40, Hbg 9.6.  MRSA not detected     Time spent - 35 minutes    LOS: 7 days   Ezekiel Slocumb, DO [facility] P9/04/2022, 7:31 PM

## 2022-02-11 NOTE — Consult Note (Addendum)
[place] Nurse Consult Note: Patient receiving care in [facility] 239. Reason for Consult: eval sacral wound Dr. Darlyne Russian has agreed to place a photo of the wound for this Covid+ patient in the record.  When that is done, care instructions for the area will be provided.  The photos from today reveal an area of hypopigmentation to the sacral/coccyx region.  This does not appear from the photos as a stage 2 PI, but rather an area of hypopigmentation.  I have written a nursing care instruction to continue to use a foam dressing to the site, and to change it every 3 days.  Val Riles, RN, MSN, CWOCN, CNS-BC, pager [phone]

## 2022-02-11 NOTE — Progress Notes (Addendum)
1600 Patient received from 2 A via bed. Patient confused. Does not know date,month, or time. Does not know where she is presently. Complains every time she is touched that we are hurting her. States she aches all over 10 out of ten is her pain level. Monitor shows SB heart rate of 39 to 40. Patient is un aware of her heart rate. Pulse oximeter show 100 % on room air and B/P is 140/80.1700 When family arrived patient quit complaining and started smiling at family. Fed dinner by grand daughter.Remains in severe Sinus Loletha Grayer.

## 2022-02-11 NOTE — Progress Notes (Signed)
PT Cancellation Note  Patient Details Name: Martha Walker MRN: [id number] DOB: 18-Dec-1931   Cancelled Treatment:    Reason Eval/Treat Not Completed: Medical issues which prohibited therapy  Chart reviewed and discussed with RN.  Agreed with request to hold session today.  Per cardiology note plan for pacemaker tomorrow AM.  Was up to commode with nursing today.   Chesley Noon 02/11/2022, 3:05 PM

## 2022-02-11 NOTE — Progress Notes (Addendum)
OT Cancellation Note  Patient Details Name: Martha Walker MRN: [id number] DOB: Jan 15, 1932   Cancelled Treatment:    Reason Eval/Treat Not Completed: Patient not medically ready. OT attempting to see pt for tx session. Upon arrival to unit, multiple nurses entering pt's room with code cart due to low HR, showed 33 bpm on tele monitor at nurses's station. Will hold therapy for now. OT to re-attempt at later time/date and as medically appropriate.   Lanelle Bal  [facility] 02/11/2022, 11:08 AM

## 2022-02-11 NOTE — TOC Progression Note (Addendum)
Transition of Care [facility]) - Progression Note    Patient Details  Name: Martha Walker MRN: [id number] Date of Birth: 1931/11/30  Transition of Care [facility] CM/SW [place], LCSW Phone Number: 02/11/2022, 10:23 AM  Clinical Narrative:  Martha Walker will review referral again.   12:13 pm: [facility] does not have any beds. Left voicemail for daughter Martha Walker.  12:22 pm: Received call back from daughter. Provided update. [facility] is second preference but she wants to discuss prior to official decision being made. She is supposed to speak with someone at [facility]. Per daughter, plan for pacemaker placement in the morning.   Expected Discharge Plan and Services                                                 Social Determinants of Health (SDOH) Interventions    Readmission Risk Interventions     No data to display

## 2022-02-11 NOTE — Progress Notes (Signed)
Martha Walker NOTE       Patient ID: [name] MRN: [id number] DOB/AGE: [birth year] 86 y.o.  Admit date: 02/03/2022 Referring Physician Dr. Tawanna Solo Primary Physician Dr. Ola Spurr  Primary Cardiologist Dr. Nehemiah Massed  Reason for Consultation bradycardia   HPI: Martha Walker is an 85yoF with a PMH of dementia, CKD 3, type 2 diabetes, hyperlipidemia, hypertension, carotid artery stenosis who presented to [facility] ED from her assisted living facility on 02/03/2022 with progressive weakness, N/V/D, and confusion above her baseline.  She was positive for COVID-19 on admission with a revealed significant leukocytes for which acute cystitis was suspected.  She is under a 10-day quarantine prior to rehab placement.  During her hospitalization she has been bradycardic with heart rate between 30s - 60s with concern for increasing frequency of sinus pauses, the longest lasting 4-5 seconds.   Interval History: -continues to be bradycardic in the 30s-40s, responding to atropine -Repeat EKG confirms a junctional bradycardia with a rate of 46 bpm -When asked how the patient feels she says "I do not now" and then closes her eyes to rest  Review of systems limited by patient's baseline dementia  Past Medical History:  Diagnosis Date   Ankle edema, bilateral    Atrophic vaginitis    B12 deficiency    Bilateral carotid artery stenosis    Chronic anemia    CKD stage 3 due to type 2 diabetes mellitus (HCC)    Dementia ([place])    Diabetes mellitus without complication ([place])    Diverticulitis    Hyperlipidemia    Hypertension    Osteoporosis    PVD (peripheral vascular disease) ([place])    Vitamin D deficiency     Past Surgical History:  Procedure Laterality Date   CATARACT EXTRACTION W/PHACO Right 02/21/2021   Procedure: CATARACT EXTRACTION PHACO AND INTRAOCULAR LENS PLACEMENT ([place]) RIGHT KAHOOK DUAL BLADE GONIOTOMY;  Surgeon: Leandrew Koyanagi, MD;  Location: [facility];   Service: Ophthalmology;  Laterality: Right;  12.45 01:20.1    Medications Prior to Admission  Medication Sig Dispense Refill Last Dose   acetaminophen (TYLENOL) 325 MG tablet Take by mouth.   prn at prn   ammonium lactate (AMLACTIN) 12 % cream Apply 1 Application topically as directed.   unknown at unknown   brimonidine (ALPHAGAN) 0.2 % ophthalmic solution Place 1 drop into both eyes 2 (two) times daily.   unknown at unknown   cetirizine (ZYRTEC) 10 MG tablet Take 10 mg by mouth daily.   unknown at unknown   Cholecalciferol 1.25 MG (50000 UT) capsule Take 50,000 Units by mouth every 7 (seven) days.   unknown at unknown   diclofenac Sodium (VOLTAREN) 1 % GEL Apply 2 g topically 4 (four) times daily as needed.   prn at prn   docusate sodium (COLACE) 100 MG capsule Take 100 mg by mouth daily.   unknown at unknown   dorzolamide-timolol (COSOPT) 22.3-6.8 MG/ML ophthalmic solution 1 drop 2 (two) times daily.   unknown at unknown   FARXIGA 10 MG TABS tablet Take 10 mg by mouth daily.   unknown at unknown   GAVILAX 17 GM/SCOOP powder Take by mouth.   unknown at unknown   hydrALAZINE (APRESOLINE) 25 MG tablet Take 25 mg by mouth 2 (two) times daily.   unknown at unknown   ketoconazole (NIZORAL) 2 % cream Apply 1 Application topically as directed. Apply as directed once or twice daily.   unknown at unknown   ketoconazole (NIZORAL) 2 %  shampoo Massage into scalp and let sit 10 minutes before washing out twice weekly. 120 mL 2 unknown at unknown   latanoprost (XALATAN) 0.005 % ophthalmic solution Place 1 drop into both eyes at bedtime.   unknown at unknown   Latanoprostene Bunod (VYZULTA) 0.024 % SOLN Apply to eye.   unknown at unknown   loperamide (IMODIUM) 2 MG capsule Take 2 mg by mouth as needed for diarrhea or loose stools.   prn at prn   magnesium hydroxide (MILK OF MAGNESIA) 400 MG/5ML suspension Take 30 mLs by mouth daily as needed for mild constipation.   prn at prn   memantine (NAMENDA) 5 MG  tablet Take 5 mg by mouth 2 (two) times daily.   unknown at unknown   metFORMIN (GLUCOPHAGE) 500 MG tablet Take 500 mg by mouth 2 (two) times daily with a meal.   unknown at unknown   mirabegron ER (MYRBETRIQ) 50 MG TB24 tablet Take 1 tablet (50 mg total) by mouth daily. 30 tablet 11 unknown at unknown   rosuvastatin (CRESTOR) 10 MG tablet Take 10 mg by mouth daily.   unknown at unknown   SENNA-TABS 8.6 MG tablet Take by mouth daily as needed.   prn at prn   triamcinolone ointment (KENALOG) 0.1 % Apply to affected areas twice daily as needed for itchy rash at back. Avoid applying to face, groin, and axilla. Use as directed. Risk of skin atrophy with long-term use reviewed. 80 g 2 prn at prn   valsartan (DIOVAN) 80 MG tablet Take 80 mg by mouth daily.   unknown at unknown   vitamin B-12 (CYANOCOBALAMIN) 1000 MCG tablet Take 1,000 mcg by mouth daily.   unknown at unknown   alum & mag hydroxide-simeth (MAALOX/MYLANTA) 200-200-20 MG/5ML suspension Take 30 mLs by mouth every 6 (six) hours as needed for indigestion or heartburn.   prn at prn   Blood Glucose Monitoring Suppl (FIFTY50 GLUCOSE METER 2.0) w/Device KIT Use as directed. DX:E11.9      Social History   Socioeconomic History   Marital status: Widowed    Spouse name: Not on file   Number of children: Not on file   Years of education: Not on file   Highest education level: Not on file  Occupational History   Not on file  Tobacco Use   Smoking status: Never   Smokeless tobacco: Never  Vaping Use   Vaping Use: Never used  Substance and Sexual Activity   Alcohol use: Never   Drug use: Never   Sexual activity: Not on file  Other Topics Concern   Not on file  Social History Narrative   Lives at assisted living facility- [facility].   Social Determinants of Health   Financial Resource Strain: Not on file  Food Insecurity: Not on file  Transportation Needs: Not on file  Physical Activity: Not on file  Stress: Not on file  Social  Connections: Not on file  Intimate Partner Violence: Not on file    No family history on file.   PHYSICAL EXAM General: Elderly caucasian female, in no acute distress. Sitting upright in hospital bed with daughter at bedside HEENT:  Normocephalic and atraumatic. Neck:  No JVD.  Lungs: Normal respiratory effort on room air. Decreased breath sounds bilaterally without appreciable crackles or wheezes Heart: bradycardic but regular Normal S1 and S2 without gallops or murmurs.  Abdomen: Non-distended appearing.  Msk: Normal strength and tone for age. Extremities: Warm and well perfused. No clubbing, cyanosis. No  peripheral edema.  Neuro: Alert and oriented  Psych:  Answers simple questions.  Labs: Basic Metabolic Panel: Recent Labs    02/10/22 0713 02/11/22 0528  NA 142 139  K 4.6 4.9  CL 111 110  CO2 25 26  GLUCOSE 137* 144*  BUN 48* 37*  CREATININE 1.38* 1.30*  CALCIUM 9.4 9.8  MG 2.4  --     Liver Function Tests: No results for input(s): "AST", "ALT", "ALKPHOS", "BILITOT", "PROT", "ALBUMIN" in the last 72 hours.  No results for input(s): "LIPASE", "AMYLASE" in the last 72 hours. CBC: Recent Labs    02/10/22 0713 02/11/22 0528  WBC 5.2 5.0  HGB 8.8* 9.6*  HCT 27.5* 30.1*  MCV 90.2 90.9  PLT 220 238    Cardiac Enzymes: Recent Labs    02/09/22 1400 02/09/22 1618  TROPONINIHS 10 11    BNP: Invalid input(s): "POCBNP" D-Dimer: No results for input(s): "DDIMER" in the last 72 hours.  Hemoglobin A1C: No results for input(s): "HGBA1C" in the last 72 hours. Fasting Lipid Panel: No results for input(s): "CHOL", "HDL", "LDLCALC", "TRIG", "CHOLHDL", "LDLDIRECT" in the last 72 hours. Thyroid Function Tests: No results for input(s): "TSH", "T4TOTAL", "T3FREE", "THYROIDAB" in the last 72 hours.  Invalid input(s): "FREET3" Anemia Panel: No results for input(s): "VITAMINB12", "FOLATE", "FERRITIN", "TIBC", "IRON", "RETICCTPCT" in the last 72 hours.   No results  found.   Radiology: CT ABDOMEN PELVIS WO CONTRAST  Result Date: 02/03/2022 CLINICAL DATA:  Abdominal pain, acute, nonlocalized EXAM: CT ABDOMEN AND PELVIS WITHOUT CONTRAST TECHNIQUE: Multidetector CT imaging of the abdomen and pelvis was performed following the standard protocol without IV contrast. RADIATION DOSE REDUCTION: This exam was performed according to the departmental dose-optimization program which includes automated exposure control, adjustment of the mA and/or kV according to patient size and/or use of iterative reconstruction technique. COMPARISON:  None Available. FINDINGS: Lower chest: Moderate right coronary artery calcification. Global cardiac size within normal limits. No acute abnormality. Hepatobiliary: Cholelithiasis without pericholecystic inflammatory change. Liver unremarkable on this noncontrast examination. No intra or extrahepatic biliary ductal dilation. Pancreas: Unremarkable Spleen: Unremarkable Adrenals/Urinary Tract: Adrenal glands are unremarkable. Kidneys are normal, without renal calculi, focal lesion, or hydronephrosis. Bladder is unremarkable. Stomach/Bowel: Severe distal colonic diverticulosis involving the distal transverse, descending, and sigmoid colon. No superimposed acute inflammatory change. Stomach, small bowel, and large bowel are unremarkable. Appendix normal. No free intraperitoneal gas or fluid. Vascular/Lymphatic: Extensive aortoiliac atherosclerotic calcification. No aortic aneurysm. Particularly prominent atherosclerotic calcification involving the mesenteric and renal artery origins, however, the degree of stenosis is not well assessed on this noncontrast examination. No pathologic adenopathy within the abdomen and pelvis. Reproductive: Status post hysterectomy. No adnexal masses. Other: No abdominal wall hernia.  Rectum unremarkable. Musculoskeletal: Remote inferior endplate fracture D22 with no significant loss of height. No acute bone abnormality.  Degenerative changes are seen within the lumbar spine. No lytic or blastic bone lesion. IMPRESSION: 1. No acute intra-abdominal pathology identified. No definite radiographic explanation for the patient's reported symptoms. 2. Moderate right coronary artery calcification. 3. Cholelithiasis. 4. Severe distal colonic diverticulosis without superimposed acute inflammatory change. 5. Peripheral vascular disease. Suspected hemodynamically significant mesenteric and renal artery stenoses. If there is clinical evidence of chronic mesenteric ischemia or hemodynamically significant renal artery stenosis, CT arteriography may be more helpful for further evaluation. Aortic Atherosclerosis (ICD10-I70.0). Electronically Signed   By: Fidela Salisbury M.D.   On: 02/03/2022 23:26   CT HEAD WO CONTRAST (5MM)  Result Date: 02/03/2022 CLINICAL DATA:  Head trauma, minor (Age >= 65y). Altered mental status. EXAM: CT HEAD WITHOUT CONTRAST TECHNIQUE: Contiguous axial images were obtained from the base of the skull through the vertex without intravenous contrast. RADIATION DOSE REDUCTION: This exam was performed according to the departmental dose-optimization program which includes automated exposure control, adjustment of the mA and/or kV according to patient size and/or use of iterative reconstruction technique. COMPARISON:  None Available. FINDINGS: Brain: Normal anatomic configuration. Parenchymal volume loss is commensurate with the patient's age. Mild periventricular white matter changes are present likely reflecting the sequela of small vessel ischemia. Remote lacunar infarct within the right thalamus and left globus pallidus. No abnormal intra or extra-axial mass lesion or fluid collection. No abnormal mass effect or midline shift. No evidence of acute intracranial hemorrhage or infarct. Ventricular size is normal. Cerebellum unremarkable. Vascular: No asymmetric hyperdense vasculature at the skull base. Extensive atherosclerotic  calcification within the carotid siphons. Skull: Intact Sinuses/Orbits: Paranasal sinuses are clear. Orbits are unremarkable. Other: Left mastoidectomy has been performed. Additionally, the auditory ossicles are absent suggesting prior resection. Debris or packing material is noted within the resection bed. Right mastoid air cells and middle ear cavity is clear. IMPRESSION: 1. No acute intracranial abnormality. 2. Mild senescent change and remote lacunar infarcts. 3. Postsurgical changes involving the left mastoid air cells and middle ear cavity. Debris versus residual packing material within the a resection bed. Electronically Signed   By: Fidela Salisbury M.D.   On: 02/03/2022 23:17   DG Chest Portable 1 View  Result Date: 02/03/2022 CLINICAL DATA:  Sob.  Recent covid. Cough, sob, nausea, vomiting. EXAM: PORTABLE CHEST 1 VIEW COMPARISON:  Chest x-ray 06/10/2021 FINDINGS: The heart and mediastinal contours are unchanged. Aortic calcification. No focal consolidation. No pulmonary edema. No pleural effusion. No pneumothorax. No acute osseous abnormality. Right shoulder rotator cuff anchor suture. IMPRESSION: 1. No active disease. 2.  Aortic Atherosclerosis (ICD10-I70.0). Electronically Signed   By: Iven Finn M.D.   On: 02/03/2022 22:45    ECHO 05/10/2019 IMPRESSIONS    1. Left ventricular ejection fraction, by visual estimation, is 60 to  65%. The left ventricle has normal function. There is no left ventricular  hypertrophy.   2. Global right ventricle has normal systolic function.The right  ventricular size is normal. No increase in right ventricular wall  thickness.   3. Left atrial size was normal.   4. Right atrial size was normal.   5. The mitral valve is normal in structure. Mild to moderate mitral valve  regurgitation. No evidence of mitral stenosis.   6. The tricuspid valve is normal in structure. Tricuspid valve  regurgitation is mild.   7. The aortic valve is normal in structure.  Aortic valve regurgitation is  not visualized. No evidence of aortic valve sclerosis or stenosis.   8. The pulmonic valve was normal in structure. Pulmonic valve  regurgitation is not visualized.   9. Mildly elevated pulmonary artery systolic pressure.  10. The inferior vena cava is normal in size with greater than 50%  respiratory variability, suggesting right atrial pressure of 3 mmHg.   FINDINGS   Left Ventricle: Left ventricular ejection fraction, by visual estimation,  is 60 to 65%. The left ventricle has normal function. There is no left  ventricular hypertrophy. Normal left atrial pressure.   Right Ventricle: The right ventricular size is normal. No increase in  right ventricular wall thickness. Global RV systolic function is has  normal systolic function. The tricuspid regurgitant velocity is 2.67  m/s,  and with an assumed right atrial pressure   of 10 mmHg, the estimated right ventricular systolic pressure is mildly  elevated at 38.5 mmHg.   Left Atrium: Left atrial size was normal in size.   Right Atrium: Right atrial size was normal in size   Pericardium: There is no evidence of pericardial effusion.   Mitral Valve: The mitral valve is normal in structure. No evidence of  mitral valve stenosis by observation. MV peak gradient, 4.0 mmHg. Mild to  moderate mitral valve regurgitation.   Tricuspid Valve: The tricuspid valve is normal in structure. Tricuspid  valve regurgitation is mild.   Aortic Valve: The aortic valve is normal in structure. Aortic valve  regurgitation is not visualized. The aortic valve is structurally normal,  with no evidence of sclerosis or stenosis. Aortic valve mean gradient  measures 4.0 mmHg. Aortic valve peak  gradient measures 8.5 mmHg. Aortic valve area, by VTI measures 1.59 cm.   Pulmonic Valve: The pulmonic valve was normal in structure. Pulmonic valve  regurgitation is not visualized.   Aorta: The aortic root, ascending aorta and  aortic arch are all  structurally normal, with no evidence of dilitation or obstruction.   Venous: The inferior vena cava is normal in size with greater than 50%  respiratory variability, suggesting right atrial pressure of 3 mmHg.   IAS/Shunts: No atrial level shunt detected by color flow Doppler. There is  no evidence of a patent foramen ovale. No ventricular septal defect is  seen or detected. There is no evidence of an atrial septal defect.      LEFT VENTRICLE  PLAX 2D  LVIDd:         4.95 cm  Diastology  LVIDs:         3.13 cm  LV e' lateral:   9.03 cm/s  LV PW:         0.99 cm  LV E/e' lateral: 9.5  LV IVS:        0.66 cm  LV e' medial:    4.03 cm/s  LVOT diam:     1.80 cm  LV E/e' medial:  21.3  LV SV:         77 ml  LV SV Index:   37.33  LVOT Area:     2.54 cm      LEFT ATRIUM             Index  LA diam:        3.70 cm 1.85 cm/m  LA Vol (A2C):   44.6 ml 22.30 ml/m  LA Vol (A4C):   70.1 ml 35.06 ml/m  LA Biplane Vol: 56.6 ml 28.30 ml/m   AORTIC VALVE                   PULMONIC VALVE  AV Area (Vmax):    1.80 cm    PV Vmax:       1.05 m/s  AV Area (Vmean):   1.71 cm    PV Vmean:      73.500 cm/s  AV Area (VTI):     1.59 cm    PV VTI:        0.263 m  AV Vmax:           146.00 cm/s PV Peak grad:  4.4 mmHg  AV Vmean:          91.200 cm/s PV Mean grad:  2.0 mmHg  AV VTI:  0.391 m  AV Peak Grad:      8.5 mmHg  AV Mean Grad:      4.0 mmHg  LVOT Vmax:         103.00 cm/s  LVOT Vmean:        61.400 cm/s  LVOT VTI:          0.244 m  LVOT/AV VTI ratio: 0.62     AORTA  Ao Root diam: 3.00 cm   MITRAL VALVE                        TRICUSPID VALVE  MV Area (PHT): 2.60 cm             TR Peak grad:   28.5 mmHg  MV Peak grad:  4.0 mmHg             TR Vmax:        267.00 cm/s  MV Mean grad:  1.0 mmHg  MV Vmax:       1.00 m/s             SHUNTS  MV Vmean:      55.1 cm/s            Systemic VTI:  0.24 m  MV VTI:        0.37 m               Systemic Diam: 1.80 cm   MV PHT:        84.68 msec  MV Decel Time: 292 msec  MV E velocity: 85.90 cm/s 103 cm/s  MV A velocity: 69.40 cm/s 70.3 cm/s  MV E/A ratio:  1.24       1.5      Isaias Cowman MD  Electronically signed by Isaias Cowman MD  Signature Date/Time: 05/11/2019/1:17:54 PM   TELEMETRY reviewed by me (LT) 02/11/2022 : Junctional bradycardia rate 40s to 50s  EKG reviewed by me: 9/4 Sinus bradycardia with PACs RBBB rate 50; 9/9 sinus bradycardia with artifact and PACs, RBBB rate 48 9/11 junctional bradycardia rate 46 RBBB  Data reviewed by me (LT) 02/11/2022: Hospitalist progress note, nursing notes, CBC, BMP, telemetry, vitals, troponins, EKGs  ASSESSMENT AND PLAN:  Martha Walker is an 44yoF with a PMH of dementia, CKD 3, type 2 diabetes, hyperlipidemia, hypertension, carotid artery stenosis who presented to [facility] ED from her assisted living facility on 02/03/2022 with progressive weakness, N/V/D, and confusion above her baseline.  She was positive for COVID-19 on admission with a revealed significant leukocytes for which acute cystitis was suspected.  She is under a 10-day quarantine prior to rehab placement.  During her hospitalization she has been bradycardic with heart rate between 30s - 60s with concern for increasing frequency of sinus pauses, the longest lasting 5.6 seconds.   #Acute cystitis #COVID-19 #Junctional bradycardia with sinus pauses, concern for sick sinus syndrome  The patient presented from her assisted living facility with generalized weakness, nausea, vomiting, diarrhea, and confusion above her baseline dementia.  UA consistent with acute cystitis, incidentally COVID-19 positive on admission.  Telemetry during admission has shown sinus bradycardia with heart rates in the mid 40s mostly, appropriately increasing to the 50s and 60s with activity.  Although over the past several days there has been concern for increased frequency of sinus pauses,, responsive to atropine. -Agree  with current therapy for treatment of UTI and COVID-19 -Continuous monitoring on telemetry while inpatient -Avoid beta-blockers, other AV nodal blocking agents -  Monitor and replete electrolytes as needed to maintain K >4, mag >2 -No indication for temporary transvenous pacemaker  -Discussed at length with the patient's daughter the risks, benefits, and alternatives to proceeding with either a DC PPM or Micra leadless PPM.  She indicates she would like to continue full scope of treatment for the patient including proceeding with PPM.  She is in agreement to proceed with Micra leadless pacemaker placement.  She is on the schedule for 9/12 at 8 AM with Dr. Isaias Cowman. -Currently pending 10-day COVID quarantine requirement prior to rehab/SNF placement.  This patient's plan of care was discussed and created with Dr. Saralyn Pilar and he is in agreement.  Signed: Tristan Schroeder , PA-C 02/11/2022, 11:19 AM [facility]

## 2022-02-12 ENCOUNTER — Encounter: Payer: Self-pay | Admitting: Cardiology

## 2022-02-12 ENCOUNTER — Encounter
Admission: EM | Disposition: A | Discharge status: Skilled Nursing Facility | Payer: Self-pay | Source: Skilled Nursing Facility | Attending: Internal Medicine

## 2022-02-12 DIAGNOSIS — E440 Moderate protein-calorie malnutrition: Secondary | ICD-10-CM | POA: Insufficient documentation

## 2022-02-12 DIAGNOSIS — N179 Acute kidney failure, unspecified: Secondary | ICD-10-CM

## 2022-02-12 DIAGNOSIS — U071 COVID-19: Secondary | ICD-10-CM | POA: Diagnosis not present

## 2022-02-12 HISTORY — PX: PACEMAKER LEADLESS INSERTION: EP1219

## 2022-02-12 LAB — GLUCOSE, CAPILLARY
Glucose-Capillary: 159 mg/dL — ABNORMAL HIGH (ref 70–99)
Glucose-Capillary: 141 mg/dL — ABNORMAL HIGH (ref 70–99)
Glucose-Capillary: 131 mg/dL — ABNORMAL HIGH (ref 70–99)
Glucose-Capillary: 142 mg/dL — ABNORMAL HIGH (ref 70–99)
Glucose-Capillary: 155 mg/dL — ABNORMAL HIGH (ref 70–99)
Glucose-Capillary: 179 mg/dL — ABNORMAL HIGH (ref 70–99)

## 2022-02-12 SURGERY — PACEMAKER LEADLESS INSERTION
Anesthesia: Moderate Sedation

## 2022-02-12 MED ORDER — MIDAZOLAM HCL 2 MG/2ML IJ SOLN
INTRAMUSCULAR | Status: AC
  Filled 2022-02-12: qty 2

## 2022-02-12 MED ORDER — SODIUM CHLORIDE 0.9 % IV SOLN: 250.0000 mL | INTRAVENOUS | Status: DC | PRN

## 2022-02-12 MED ORDER — LIDOCAINE HCL (PF) 1 % IJ SOLN
INTRAMUSCULAR | Status: DC | PRN
  Administered 2022-02-12: 15 mL

## 2022-02-12 MED ORDER — HEPARIN SODIUM (PORCINE) 1000 UNIT/ML IJ SOLN
INTRAMUSCULAR | Status: AC
  Filled 2022-02-12: qty 10

## 2022-02-12 MED ORDER — HEPARIN (PORCINE) IN NACL 1000-0.9 UT/500ML-% IV SOLN
INTRAVENOUS | Status: AC
  Filled 2022-02-12: qty 1000

## 2022-02-12 MED ORDER — LIDOCAINE HCL 1 % IJ SOLN
INTRAMUSCULAR | Status: AC
  Filled 2022-02-12: qty 20

## 2022-02-12 MED ORDER — MIDAZOLAM HCL 2 MG/2ML IJ SOLN
INTRAMUSCULAR | Status: DC | PRN
  Administered 2022-02-12 (×3): .5 mg via INTRAVENOUS

## 2022-02-12 MED ORDER — HEPARIN (PORCINE) IN NACL 2000-0.9 UNIT/L-% IV SOLN
INTRAVENOUS | Status: DC | PRN
  Administered 2022-02-12: 1000 mL

## 2022-02-12 MED ORDER — VITAMIN C 500 MG PO TABS
500.0000 mg | ORAL_TABLET | Freq: Two times a day (BID) | ORAL | Status: DC
  Administered 2022-02-12 – 2022-02-15 (×6): 500 mg via ORAL
  Filled 2022-02-12 (×6): qty 1

## 2022-02-12 MED ORDER — SODIUM CHLORIDE 0.9% FLUSH
3.0000 mL | Freq: Two times a day (BID) | INTRAVENOUS | Status: DC
  Administered 2022-02-12 – 2022-02-15 (×7): 3 mL via INTRAVENOUS

## 2022-02-12 MED ORDER — FENTANYL CITRATE (PF) 100 MCG/2ML IJ SOLN
INTRAMUSCULAR | Status: AC
  Filled 2022-02-12: qty 2

## 2022-02-12 MED ORDER — SODIUM CHLORIDE 0.9% FLUSH: 3.0000 mL | INTRAVENOUS | Status: DC | PRN

## 2022-02-12 MED ORDER — HEPARIN SODIUM (PORCINE) 1000 UNIT/ML IJ SOLN
INTRAMUSCULAR | Status: DC | PRN
  Administered 2022-02-12: 2000 [IU] via INTRAVENOUS
  Administered 2022-02-12: 4000 [IU] via INTRAVENOUS

## 2022-02-12 MED ORDER — IOHEXOL 300 MG/ML  SOLN
INTRAMUSCULAR | Status: DC | PRN
  Administered 2022-02-12: 10 mL

## 2022-02-12 MED ORDER — ENSURE ENLIVE PO LIQD
237.0000 mL | Freq: Three times a day (TID) | ORAL | Status: DC
  Administered 2022-02-12 – 2022-02-14 (×4): 237 mL via ORAL

## 2022-02-12 MED ORDER — SODIUM CHLORIDE 0.9 % IV SOLN
INTRAVENOUS | Status: AC | PRN
  Administered 2022-02-12: 600 mL via INTRAVENOUS

## 2022-02-12 MED ORDER — FENTANYL CITRATE (PF) 100 MCG/2ML IJ SOLN
INTRAMUSCULAR | Status: DC | PRN
  Administered 2022-02-12 (×2): 12.5 ug via INTRAVENOUS

## 2022-02-12 MED ORDER — ONDANSETRON HCL 4 MG/2ML IJ SOLN: 4.0000 mg | Freq: Four times a day (QID) | INTRAMUSCULAR | Status: DC | PRN

## 2022-02-12 SURGICAL SUPPLY — 22 items
CATH INFINITI JR4 5F (CATHETERS) IMPLANT
DILATOR VESSEL 38 20CM 12FR (INTRODUCER) IMPLANT
DILATOR VESSEL 38 20CM 14FR (INTRODUCER) IMPLANT
DILATOR VESSEL 38 20CM 18FR (INTRODUCER) IMPLANT
DILATOR VESSEL 38 20CM 8FR (INTRODUCER) IMPLANT
DRAPE BRACHIAL (DRAPES) IMPLANT
MICRA AV TRANSCATH PACING SYS (Pacemaker) ×1 IMPLANT
MICRA INTRODUCER SHEATH (SHEATH) ×1
NDL PERC 18GX7CM (NEEDLE) IMPLANT
NEEDLE PERC 18GX7CM (NEEDLE) ×1 IMPLANT
PACK CARDIAC CATH (CUSTOM PROCEDURE TRAY) ×1 IMPLANT
PAD ELECT DEFIB RADIOL ZOLL (MISCELLANEOUS) IMPLANT
PROTECTION STATION PRESSURIZED (MISCELLANEOUS) ×1
SET ATX SIMPLICITY (MISCELLANEOUS) IMPLANT
SHEATH AVANTI 5FR X 11CM (SHEATH) IMPLANT
SHEATH AVANTI 7FRX11 (SHEATH) IMPLANT
SHEATH INTRODUCER MICRA (SHEATH) IMPLANT
STATION PROTECTION PRESSURIZED (MISCELLANEOUS) IMPLANT
SUT SILK 0 FSL (SUTURE) IMPLANT
SYSTEM PACING TRNSCTH AV MICRA (Pacemaker) IMPLANT
WIRE AMPLATZ SS-J .035X180CM (WIRE) IMPLANT
WIRE GUIDERIGHT .035X150 (WIRE) IMPLANT

## 2022-02-12 NOTE — Progress Notes (Addendum)
Initial Nutrition Assessment  DOCUMENTATION CODES:   Non-severe (moderate) malnutrition in context of chronic illness  INTERVENTION:   Ensure Enlive po TID, each supplement provides 350 kcal and 20 grams of protein.  MVI po daily   Vitamin C '500mg'$  po BID  Zinc '220mg'$  po daily   Liberalize diet   Pt at high refeed risk; recommend monitor potassium, magnesium and phosphorus labs daily until stable  NUTRITION DIAGNOSIS:   Moderate Malnutrition related to chronic illness as evidenced by moderate fat depletion, moderate muscle depletion, percent weight loss.  GOAL:   Patient will meet greater than or equal to 90% of their needs  MONITOR:   PO intake, Supplement acceptance, Labs, Weight trends, Skin, I & O's  REASON FOR ASSESSMENT:   Malnutrition Screening Tool    ASSESSMENT:   88 y/o female with h/o CKD III, DM, HTN, anemia, HLD, dementia and PVD who is admitted with COVID 19, UTI and bradycardia s/p  pacemaker 9/12.  Visited pt's room today. Pt just returned from having pacemaker placed and is lethargic and unable to provide any history. RD unsure what pt's baseline mental status is. Pt resides at [facility]. Per chart, pt is down ~21lbs(13%) since February; this is significant weight loss. Pt documented to have eaten 50% of her dinner last night but has been NPO today for pacemaker. RD will add supplements and vitamins to help pt meet her estimated needs and to support wound healing. RD will also liberalize pt's diet. Pt is likely at high refeed risk. Palliative care is following.   Medications reviewed and include: vitamin C, B12, lovenox, insulin, MVI, zinc  Labs reviewed: K 4.9 wnl, BUN 37(H), creat 1.30(H)- 9/11 Hgb 9.6(L), Hct 30.1(L) Cbgs- 142, 131, 141 x 24 hrs  NUTRITION - FOCUSED PHYSICAL EXAM:  Flowsheet Row Most Recent Value  Orbital Region Mild depletion  Upper Arm Region Severe depletion  Thoracic and Lumbar Region Mild depletion  Buccal Region Mild  depletion  Temple Region No depletion  Clavicle Bone Region Moderate depletion  Clavicle and Acromion Bone Region Moderate depletion  Scapular Bone Region Mild depletion  Dorsal Hand Mild depletion  Patellar Region Moderate depletion  Anterior Thigh Region Moderate depletion  Posterior Calf Region Moderate depletion  Edema (RD Assessment) Mild  Hair Reviewed  Eyes Reviewed  Mouth Reviewed  Skin Reviewed  Nails Reviewed   Diet Order:   Diet Order             Diet Heart Room service appropriate? Yes; Fluid consistency: Thin  Diet effective now                  EDUCATION NEEDS:   No education needs have been identified at this time  Skin:  Skin Assessment: Reviewed RN Assessment (Stage II sacrum)  Last BM:  9/12- type 6  Height:   Ht Readings from Last 1 Encounters:  02/11/22 '5\' 4"'$  (1.626 m)    Weight:   Wt Readings from Last 1 Encounters:  02/11/22 65.4 kg    Ideal Body Weight:  54.5 kg  BMI:  Body mass index is 24.75 kg/m.  Estimated Nutritional Needs:   Kcal:  1600-1800kcal/day  Protein:  75-85g/day  Fluid:  1.4-1.6L/day  Koleen Distance MS, RD, LDN Please refer to [facility] for RD and/or RD on-call/weekend/after hours pager

## 2022-02-12 NOTE — Progress Notes (Addendum)
2947 Patient wet. CHG bath done. Patient complained the whole bath. Every time patient is touched she cries out in pain. When ask about pain she states she doesn't have pain. Daughter in room with patient.Daughter has no mask on or personal protective equipment. Daughter reminded patient was Covid + and she needed to wear mask and gown to protect herself. Daughter refused. Daughter cautioned that she could "catch" Covid from her mother. CHG bath done. Bed and gown changed. Pure Wic  replaced.  6546 Down to Vascular.Lab via bed with N 95 mask on patient. 1050 Patient returned from vascular lab. Recovered in room per Vascular lab staff. 1130 Report received from [facility]. 1200 Daughters in to visit. Patient sedated for pacemaker procedure. Periods of apnea noted with no change in SPO2 readings. Multiple puncture areas on left and right groin ares and right wrist from trouble getting access to place pacemaker. Tegaderm dressing on right groin as well.  1400 Resting at intervals. Remains confused. 1600 Drank an Ensure 1800 Sleeping peacefully. Bed changed 5 times and purewic changed and re adjusted each time. Remains paced at rate of 50 or above.

## 2022-02-12 NOTE — Progress Notes (Signed)
                                                     Palliative Care Progress Note   Patient Name: Martha Walker       Date: 02/12/2022 DOB: 06-02-1932  Age: 88 y.o. MRN#: [id number] Attending Physician: Ezekiel Slocumb, DO Primary Care Physician: Leonel Ramsay, MD Admit Date: 02/03/2022  After reviewing the patient's chart, I attempted to assess the patient at bedside.  Patient unavailable as she is in cath lab for pacemaker placement.  I counseled with dayshift RN [place].  Myra confirmed patient's daughter Manuela Schwartz Risk analyst) remains in agreement with full code and full scope of treatment.  Plan remains for patient to complete COVID-19 isolation on 9/14 and discharge to [facility] for rehab.   PMT will shadow the patient's chart ad re-engage if goals change, at patient/family's request, or if patient's health deteriorates during this hospitalization.  Thank you for allowing the Palliative Medicine Team to assist in the care of Martha Walker.  NO CHARGE  Revia Nghiem L. Ilsa Iha, FNP-BC Palliative Medicine Team Team Phone # [phone]

## 2022-02-12 NOTE — Progress Notes (Signed)
PROGRESS NOTE  Martha Walker  GNO:[id number] DOB: January 06, 1932 DOA: 02/03/2022 PCP: Leonel Ramsay, MD   Brief Narrative: Patient is a 88 year old female with history of carotid stenosis, CKD stage IIIa, diabetes type 2, hyperlipidemia, hypertension, peripheral vascular disease who presented from ALF with complaint of progressive weakness, nausea, vomiting, diarrhea, confusion.  On presentation she was hemodynamically stable, on room air.  Lab work showed creatinine of 2.15, baseline creatinine around 1.3.  COVID screen test positive.  UA showed significant leukocytes, UTI suspected, treated with ceftriaxone, completed.  Hospital course remarkable for intermittent  bradycardia.   Cardiology consulted, initially had no plan for intervention, but due to worsening and persistent bradycardia down to 20's and sinus pauses, pacemaker was placed on 9/12.  Closely monitoring in ICU at this time.  PT/OT consulted for weakness, SNF recommended but requires 10 day isolation for Covid.   Assessment & Plan:  Principal Problem:   COVID-19 Active Problems:   Bradycardia   Pressure injury of skin  COVID infection: Presented with nausea, vomiting, diarrhea, weakness. Suspected  COVID gastroenteritis.  The symptoms have resolved now.  Continue molnupiravir.   Continue airborne and contact precautions. On 10 day isolation to go to SNF/rehab.   UTI suspected / Lactobacillus on urine culture:  UA was suggestive  of UTI with some few leukocytes, but unclear if pt symptomatic due to dementia. Treated with 3 days empiric ceftriaxone.  Urine culture grew lactobacillus spp, doubt pathogenic but colonization.  Pt is afebrile, no leukocytosis.  Monitor clinically, consider repeat cutlure and start antibiotic if s/sx's of infection.   Bradycardia: She is mostly asymptomatic.  Patient heart rate runs low to 40.  Heart rate fluctuating between 40-70.  Central telemetry has reported occasional pauses of about 2  seconds.  Per chart review, during last admission, she was seen by Dr. 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  Cardiology consulted, initially recommended conservative management.  9/9 -- rapid called, HR dipping into 20's.  Given 0.5 mg atropine, will have PRN available if needed Cardiology updated and will see her today.  No indication for temporary pacing given otherwise stable, per cardiology. 9/10-11 -- required atropine a few times for HR in 20's, but stable and asymptomatic 9/12 -- pacemaker placed this AM for likely sick sinus syndrome  --Close monitoring in stepdown unit --[facility] Cardiology following --Monitor on telemetry --Avoid AV nodal blocker agents.     Confusion/Dementia : Likely from metabolic encephalopathy from UTI, COVID.  As per the daughter, she is intermittently confused, she likely has some underlying dementia / cognitive impairment. Takes memantine. Continue supportive care, frequent reorientation, delirium precautions.  CT head showed remote lacunar infarcts, no acute findings. --Palliative consulted for [place] discussions -  remains full code, full scope per daughter.   AKI in CKD stage IIIa: Baseline creatinine 1.3.  Presented with creatinine in the range of 2.   Now back to baseline with IV fluid, IV fluid discontinued   Bilateral carotid stenosis: On Plavix, statin   Diabetes type 2: On metformin at home.  Continue sliding scale insulin.  Blood sugars   Hyperlipidemia: On statin at home   Hypotension: noted on 9/7.   We have d/c'd antihypertensives for now. Monitor BP closely.  Maintain MAP>65 with fluids if needed.   Given small fluid bolus on 9/7.  Hypertension: Currently blood pressure stable.  Hold Hydralazine.  ARB was held due to AKI, now held for soft BP's   Peripheral vascular disease: CT  abdomen/pelvis showed hemodynamically significant mesenteric and renal artery stenoses.  Further work-up as an outpatient, cannot do contrast  studies like CT angiogram due  her kidney function.  Questionable significance of further work-up due to her advanced age  Weakness/disposition: Patient is from ALF.  PT/OT consulted, SNF recommended.  TOC following for placement.   Stage II sacral wound - POA.  I agree with the wound description as outlined below. Continue diligent wound care, frequent repositioning and monitor site closely for signs of infection.   Pressure Injury 02/04/22 Sacrum Medial Stage 2 -  Partial thickness loss of dermis presenting as a shallow open injury with a red, pink wound bed without slough. 3x3 open area to skin (Active)  02/04/22 0430  Location: Sacrum  Location Orientation: Medial  Staging: Stage 2 -  Partial thickness loss of dermis presenting as a shallow open injury with a red, pink wound bed without slough.  Wound Description (Comments): 3x3 open area to skin  Present on Admission: Yes  Dressing Type Foam - Lift dressing to assess site every shift 02/11/22 2000    DVT prophylaxis:enoxaparin (LOVENOX) injection 30 mg Start: 02/08/22 2200     Code Status: Full Code  Family Communication: Daughter at bedside 9/6, 9/8  Patient status: Inpatient  Patient is from : ALF  Anticipated discharge to: SNF  Estimated DC date: Requires 10-day isolation for COVID-positive (earliest d/c 9/14).    Consultants: Cardiology  Procedures:None    Subjective: Patient was sedated, seen in ICU shortly after pacemaker placed.  Met with daughter while pt in procedure.  No acute events reported.    Objective: Vitals:   02/12/22 0400 02/12/22 0500 02/12/22 0600 02/12/22 0700  BP: (!) 132/59 (!) 150/46 (!) 128/54 (!) 129/47  Pulse: (!) 50 (!) 48 (!) 56 64  Resp: _0 (!) 25  Temp: (!) 97.5 F (36.4 C)     TempSrc: Oral     SpO2: 96% 99% 96% 98%  Weight:      Height:       Filed Weights   02/04/22 0406 02/11/22 0022 02/11/22 1555  Weight: 77 kg 65.7 kg 65.4 kg    Examination:  General exam:  sedated & sleeping comfortably no acute distress Respiratory system: on room air, normal respiratory effort. Cardiovascular system: RRR, no pedal edema Central nervous system: A&O x self. no gross focal neurologic deficits Extremities: moves all, no edema, normal tone Skin: dry, intact, no rashes seen, sacral skin injury image below:      Data Reviewed:    Labs notable for --- CBG's at goal, negative MRSA     Time spent - 35 minutes    LOS: 8 days   Ezekiel Slocumb, DO [facility] P9/05/2022, 7:54 AM

## 2022-02-12 NOTE — Progress Notes (Signed)
PT Cancellation Note  Patient Details Name: Martha Walker MRN: [id number] DOB: [birth year]   Cancelled Treatment:    Reason Eval/Treat Not Completed: Medical issues which prohibited therapy (Per chart, patient transferred to CCU and scheduled for PPM placement this date.  Per guidelines, will require new orders to resume services.  Please reconsult as medically appropriate.)  Tadeusz Stahl H. Owens Shark, PT, DPT, NCS 02/12/22, 7:45 AM [phone]

## 2022-02-13 DIAGNOSIS — U071 COVID-19: Secondary | ICD-10-CM | POA: Diagnosis not present

## 2022-02-13 LAB — BASIC METABOLIC PANEL
Anion gap: 4 — ABNORMAL LOW (ref 5–15)
BUN: 33 mg/dL — ABNORMAL HIGH (ref 8–23)
CO2: 25 mmol/L (ref 22–32)
Calcium: 9.4 mg/dL (ref 8.9–10.3)
Chloride: 113 mmol/L — ABNORMAL HIGH (ref 98–111)
Creatinine, Ser: 1.17 mg/dL — ABNORMAL HIGH (ref 0.44–1.00)
GFR, Estimated: 45 mL/min — ABNORMAL LOW (ref >60)
Glucose, Bld: 138 mg/dL — ABNORMAL HIGH (ref 70–99)
Potassium: 4.3 mmol/L (ref 3.5–5.1)
Sodium: 142 mmol/L (ref 135–145)

## 2022-02-13 LAB — CBC
HCT: 26.1 % — ABNORMAL LOW (ref 36.0–46.0)
Hemoglobin: 8.1 g/dL — ABNORMAL LOW (ref 12.0–15.0)
MCH: 28.7 pg (ref 26.0–34.0)
MCHC: 31 g/dL (ref 30.0–36.0)
MCV: 92.6 fL (ref 80.0–100.0)
Platelets: 205 10*3/uL (ref 150–400)
RBC: 2.82 MIL/uL — ABNORMAL LOW (ref 3.87–5.11)
RDW: 13.6 % (ref 11.5–15.5)
WBC: 6.4 10*3/uL (ref 4.0–10.5)
nRBC: 0 % (ref 0.0–0.2)

## 2022-02-13 LAB — GLUCOSE, CAPILLARY
Glucose-Capillary: 154 mg/dL — ABNORMAL HIGH (ref 70–99)
Glucose-Capillary: 214 mg/dL — ABNORMAL HIGH (ref 70–99)
Glucose-Capillary: 117 mg/dL — ABNORMAL HIGH (ref 70–99)

## 2022-02-13 MED ORDER — ORAL CARE MOUTH RINSE: 15.0000 mL | OROMUCOSAL | Status: DC | PRN

## 2022-02-13 MED ORDER — ORAL CARE MOUTH RINSE
15.0000 mL | OROMUCOSAL | Status: DC
  Administered 2022-02-13 – 2022-02-15 (×6): 15 mL via OROMUCOSAL

## 2022-02-13 MED ORDER — ROSUVASTATIN CALCIUM 10 MG PO TABS
10.0000 mg | ORAL_TABLET | Freq: Every day | ORAL | Status: DC
  Administered 2022-02-13 – 2022-02-14 (×2): 10 mg via ORAL
  Filled 2022-02-13 (×2): qty 1

## 2022-02-13 NOTE — TOC Progression Note (Signed)
Transition of Care [facility]) - Progression Note    Patient Details  Name: Martha Walker MRN: [id number] Date of Birth: 1932/05/11  [facility] CM/SW Contact  Shelbie Hutching, RN Phone Number: 02/13/2022, 10:57 AM  Clinical Narrative:    [facility] will look at the referral but if it is for long term care they cannot accept the patient.  They can accept for short term rehab only.     Expected Discharge Plan: [place] Barriers to Discharge: Continued Medical Work up  Expected Discharge Plan and Services Expected Discharge Plan: [place]   Discharge Planning Services: CM Consult Post Acute Care Choice: [place] Living arrangements for the past 2 months: Single Family Home                 DME Arranged: N/A DME Agency: NA       HH Arranged: NA HH Agency: NA         Social Determinants of Health (SDOH) Interventions    Readmission Risk Interventions     No data to display

## 2022-02-13 NOTE — TOC Progression Note (Signed)
Transition of Care [facility]) - Progression Note    Patient Details  Name: SERGIO HOBART MRN: [id number] Date of Birth: 1932-04-02  [facility] of Care [facility]) CM/SW Contact  Shelbie Hutching, RN Phone Number: 02/13/2022, 2:56 PM  Clinical Narrative:    [facility] will not offer a bed as they do not have a long term bed available for the patient.  Daughter says that the plan was not for long term care but skilled nursing, explained that [facility] has skilled nursing availability for short term rehab but not for long term skilled nursing care.  Daughter does not think that the patient will be able to go back to [facility], she thinks that she will need skilled nursing care, she also says that the decision is not up to her it depends on how she does with therapy.  She would like to discuss next steps with her family.  Reviewed the current bed offers and told her that patient would be ready for discharge tomorrow.     Expected Discharge Plan: [place] Barriers to Discharge: Continued Medical Work up  Expected Discharge Plan and Services Expected Discharge Plan: [place]   Discharge Planning Services: CM Consult Post Acute Care Choice: [place] Living arrangements for the past 2 months: Single Family Home                 DME Arranged: N/A DME Agency: NA       HH Arranged: NA HH Agency: NA         Social Determinants of Health (SDOH) Interventions    Readmission Risk Interventions     No data to display

## 2022-02-13 NOTE — Progress Notes (Addendum)
Report called to [employer], RN, '@2025'$ . Pt to transfer to Rm 250 shortly. Pt's daughter, Manuela Schwartz to be notified.  Daughter notified @ 2035.

## 2022-02-13 NOTE — Consult Note (Signed)
[facility]              MRN : [id number]  Martha Walker is a 88 y.o. (1932/03/06) female who presents with chief complaint of check circulation.  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  The patient denies abdominal pain or postprandial symptoms.  The patient does not have documented weight loss or nausea.  The patient does not substantiate food fear, particular foods do not seem to aggravate or alleviate the symptoms.  The patient denies bloody bowel movements or diarrhea.    No history of peptic ulcer disease.   No prior peripheral angiograms or vascular interventions.  The patient denies amaurosis fugax or recent TIA symptoms. There are no recent neurological changes noted. The patient denies claudication symptoms or rest pain symptoms. The patient denies history of DVT, PE or superficial thrombophlebitis. The patient denies recent episodes of angina    Current Meds  Medication Sig   acetaminophen (TYLENOL) 325 MG tablet Take by mouth.   ammonium lactate (AMLACTIN) 12 % cream Apply 1 Application topically as directed.   brimonidine (ALPHAGAN) 0.2 % ophthalmic solution Place 1 drop into both eyes 2 (two) times daily.   cetirizine (ZYRTEC) 10 MG tablet Take 10 mg by mouth daily.   Cholecalciferol 1.25 MG (50000 UT) capsule Take 50,000 Units by mouth every 7 (seven) days.   diclofenac Sodium (VOLTAREN) 1 % GEL Apply 2 g topically 4 (four) times daily as needed.   docusate sodium (COLACE) 100 MG capsule Take 100 mg by mouth daily.   dorzolamide-timolol (COSOPT) 22.3-6.8 MG/ML ophthalmic solution 1 drop 2 (two) times daily.   FARXIGA 10 MG TABS tablet Take 10 mg by mouth daily.   GAVILAX 17 GM/SCOOP powder Take by mouth.   hydrALAZINE (APRESOLINE) 25 MG tablet Take 25 mg by mouth 2 (two) times daily.   ketoconazole (NIZORAL) 2 % cream  Apply 1 Application topically as directed. Apply as directed once or twice daily.   ketoconazole (NIZORAL) 2 % shampoo Massage into scalp and let sit 10 minutes before washing out twice weekly.   latanoprost (XALATAN) 0.005 % ophthalmic solution Place 1 drop into both eyes at bedtime.   Latanoprostene Bunod (VYZULTA) 0.024 % SOLN Apply to eye.   loperamide (IMODIUM) 2 MG capsule Take 2 mg by mouth as needed for diarrhea or loose stools.   magnesium hydroxide (MILK OF MAGNESIA) 400 MG/5ML suspension Take 30 mLs by mouth daily as needed for mild constipation.   memantine (NAMENDA) 5 MG tablet Take 5 mg by mouth 2 (two) times daily.   metFORMIN (GLUCOPHAGE) 500 MG tablet Take 500 mg by mouth 2 (two) times daily with a meal.   mirabegron ER (MYRBETRIQ) 50 MG TB24 tablet Take 1 tablet (50 mg total) by mouth daily.   rosuvastatin (CRESTOR) 10 MG tablet Take 10 mg by mouth daily.   SENNA-TABS 8.6 MG tablet Take by mouth daily as needed.   triamcinolone ointment (KENALOG) 0.1 % Apply to affected areas twice daily as needed for itchy rash at back. Avoid applying to face, groin, and axilla. Use as directed. Risk of skin atrophy with long-term use reviewed.   valsartan (DIOVAN) 80 MG tablet Take 80 mg by mouth daily.   vitamin B-12 (CYANOCOBALAMIN) 1000 MCG tablet Take 1,000 mcg by mouth daily.    Past Medical History:  Diagnosis Date   Ankle edema, bilateral    Atrophic vaginitis    B12 deficiency    Bilateral carotid artery stenosis    Chronic anemia    CKD stage 3 due to type 2 diabetes mellitus ([place])    Dementia (HCC)    Diabetes mellitus without complication ([place])    Diverticulitis    Hyperlipidemia    Hypertension    Osteoporosis    PVD (peripheral vascular disease) ([place])    Vitamin D deficiency     Past Surgical History:  Procedure Laterality Date   CATARACT EXTRACTION W/PHACO Right 02/21/2021   Procedure: CATARACT EXTRACTION PHACO AND INTRAOCULAR LENS PLACEMENT ([place]) RIGHT KAHOOK  DUAL BLADE GONIOTOMY;  Surgeon: Leandrew Koyanagi, MD;  Location: [facility];  Service: Ophthalmology;  Laterality: Right;  12.45 01:20.1   PACEMAKER LEADLESS INSERTION N/A 02/12/2022   Procedure: PACEMAKER LEADLESS INSERTION;  Surgeon: Isaias Cowman, MD;  Location: [facility];  Service: Cardiovascular;  Laterality: N/A;  8am please    Social History Social History   Tobacco Use   Smoking status: Never   Smokeless tobacco: Never  Vaping Use   Vaping Use: Never used  Substance Use Topics   Alcohol use: Never   Drug use: Never    Family History No family history on file.  Allergies  Allergen Reactions   Amlodipine Cough   Amlodipine Besy-Benazepril Hcl     Other reaction(s): Unknown   Atorvastatin Nausea And Vomiting    Onset 02/08/2011.   Augmentin [Amoxicillin-Pot Clavulanate]    Bactrim [Sulfamethoxazole-Trimethoprim]    Etodolac    Fosinopril    Ibudone [Hydrocodone-Ibuprofen]    Januvia [Sitagliptin]    Lipitor [Atorvastatin Calcium]    Metronidazole    Mobic [Meloxicam]    Penicillins    Pravastatin    Simvastatin    Vicodin [Hydrocodone-Acetaminophen]    Tylenol [Acetaminophen] Rash     REVIEW OF SYSTEMS (Negative unless checked)  Constitutional: '[]'$ Weight loss  '[]'$ Fever  '[]'$ Chills Cardiac: '[]'$ Chest pain   '[]'$ Chest pressure   '[]'$ Palpitations   '[]'$ Shortness of breath when laying flat   '[]'$ Shortness of breath with exertion. Vascular:  '[x]'$ Pain in legs with walking   '[]'$ Pain in legs at rest  '[]'$ History of DVT   '[]'$ Phlebitis   '[]'$ Swelling in legs   '[]'$ Varicose veins   '[]'$ Non-healing ulcers Pulmonary:   '[]'$ Uses home oxygen   '[]'$ Productive cough   '[]'$ Hemoptysis   '[]'$ Wheeze  '[]'$ COPD   '[]'$ Asthma Neurologic:  '[]'$ Dizziness   '[]'$ Seizures   '[]'$ History of stroke   '[]'$ History of TIA  '[]'$ Aphasia   '[]'$ Vissual changes   '[]'$ Weakness or numbness in arm   '[]'$ Weakness or numbness in leg Musculoskeletal:   '[]'$ Joint swelling   '[]'$ Joint pain   '[]'$ Low back pain Hematologic:  '[]'$ Easy  bruising  '[]'$ Easy bleeding   '[]'$ Hypercoagulable state   '[]'$ Anemic Gastrointestinal:  '[]'$ Diarrhea   '[]'$ Vomiting  '[]'$ Gastroesophageal reflux/heartburn   '[]'$ Difficulty swallowing. Genitourinary:  '[]'$ Chronic kidney disease   '[]'$ Difficult urination  '[]'$ Frequent urination   '[]'$ Blood in urine Skin:  '[]'$ Rashes   '[]'$ Ulcers  Psychological:  '[]'$ History of anxiety   '[]'$  History of major depression.  Physical Examination  Vitals:   02/13/22 0800 02/13/22 0900 02/13/22 1000 02/13/22 1100  BP: 108/63 138/63 (!) 92/55 (!) 108/58  Pulse: 63 67 (!) 59 (!) 58  Resp: '12 16 15 17  '$ Temp: 98.9 F (37.2 C)     TempSrc: Oral     SpO2: 100% 99% 100% 100%  Weight:  Height:       Body mass index is 24.75 kg/m. Gen: WD/WN, NAD Head: [place]/AT, No temporalis wasting.  Ear/Nose/Throat: Hearing grossly intact, nares w/o erythema or drainage Eyes: PER, EOMI, sclera nonicteric.  Neck: Supple, no masses.  No bruit or JVD.  Pulmonary:  Good air movement, no audible wheezing, no use of accessory muscles.  Cardiac: RRR, normal S1, S2, no Murmurs. Vascular:  mild trophic changes, no open wounds Vessel Right Left  Radial Palpable Palpable  Gastrointestinal: soft, non-distended. No guarding/no peritoneal signs.  Musculoskeletal: M/S 5/5 throughout.  No visible deformity.  Neurologic: CN 2-12 intact. Pain and light touch intact in extremities.  Symmetrical.  Speech is fluent. Motor exam as listed above. Psychiatric: Judgment intact, Mood & affect appropriate for pt's clinical situation. Dermatologic: No rashes or ulcers noted.  No changes consistent with cellulitis.   CBC Lab Results  Component Value Date   WBC 6.4 02/13/2022   HGB 8.1 (L) 02/13/2022   HCT 26.1 (L) 02/13/2022   MCV 92.6 02/13/2022   PLT 205 02/13/2022    BMET    Component Value Date/Time   NA 142 02/13/2022 0402   K 4.3 02/13/2022 0402   CL 113 (H) 02/13/2022 0402   CO2 25 02/13/2022 0402   GLUCOSE 138 (H) 02/13/2022 0402   BUN 33 (H) 02/13/2022  0402   CREATININE 1.17 (H) 02/13/2022 0402   CALCIUM 9.4 02/13/2022 0402   GFRNONAA 45 (L) 02/13/2022 0402   GFRAA 49 (L) 05/11/2019 0727   Estimated Creatinine Clearance: 28.7 mL/min (A) (by C-G formula based on SCr of 1.17 mg/dL (H)).  COAG No results found for: "INR", "PROTIME"  Radiology EP PPM/ICD IMPLANT  Result Date: 02/12/2022 Successful Micra AV leadless pacemaker implantation   CT ABDOMEN PELVIS WO CONTRAST  Result Date: 02/03/2022 CLINICAL DATA:  Abdominal pain, acute, nonlocalized EXAM: CT ABDOMEN AND PELVIS WITHOUT CONTRAST TECHNIQUE: Multidetector CT imaging of the abdomen and pelvis was performed following the standard protocol without IV contrast. RADIATION DOSE REDUCTION: This exam was performed according to the departmental dose-optimization program which includes automated exposure control, adjustment of the mA and/or kV according to patient size and/or use of iterative reconstruction technique. COMPARISON:  None Available. FINDINGS: Lower chest: Moderate right coronary artery calcification. Global cardiac size within normal limits. No acute abnormality. Hepatobiliary: Cholelithiasis without pericholecystic inflammatory change. Liver unremarkable on this noncontrast examination. No intra or extrahepatic biliary ductal dilation. Pancreas: Unremarkable Spleen: Unremarkable Adrenals/Urinary Tract: Adrenal glands are unremarkable. Kidneys are normal, without renal calculi, focal lesion, or hydronephrosis. Bladder is unremarkable. Stomach/Bowel: Severe distal colonic diverticulosis involving the distal transverse, descending, and sigmoid colon. No superimposed acute inflammatory change. Stomach, small bowel, and large bowel are unremarkable. Appendix normal. No free intraperitoneal gas or fluid. Vascular/Lymphatic: Extensive aortoiliac atherosclerotic calcification. No aortic aneurysm. Particularly prominent atherosclerotic calcification involving the mesenteric and renal artery  origins, however, the degree of stenosis is not well assessed on this noncontrast examination. No pathologic adenopathy within the abdomen and pelvis. Reproductive: Status post hysterectomy. No adnexal masses. Other: No abdominal wall hernia.  Rectum unremarkable. Musculoskeletal: Remote inferior endplate fracture G81 with no significant loss of height. No acute bone abnormality. Degenerative changes are seen within the lumbar spine. No lytic or blastic bone lesion. IMPRESSION: 1. No acute intra-abdominal pathology identified. No definite radiographic explanation for the patient's reported symptoms. 2. Moderate right coronary artery calcification. 3. Cholelithiasis. 4. Severe distal colonic diverticulosis without superimposed acute inflammatory change. 5. Peripheral vascular disease. Suspected hemodynamically  significant mesenteric and renal artery stenoses. If there is clinical evidence of chronic mesenteric ischemia or hemodynamically significant renal artery stenosis, CT arteriography may be more helpful for further evaluation. Aortic Atherosclerosis (ICD10-I70.0). Electronically Signed   By: Fidela Salisbury M.D.   On: 02/03/2022 23:26   CT HEAD WO CONTRAST (5MM)  Result Date: 02/03/2022 CLINICAL DATA:  Head trauma, minor (Age >= 65y). Altered mental status. EXAM: CT HEAD WITHOUT CONTRAST TECHNIQUE: Contiguous axial images were obtained from the base of the skull through the vertex without intravenous contrast. RADIATION DOSE REDUCTION: This exam was performed according to the departmental dose-optimization program which includes automated exposure control, adjustment of the mA and/or kV according to patient size and/or use of iterative reconstruction technique. COMPARISON:  None Available. FINDINGS: Brain: Normal anatomic configuration. Parenchymal volume loss is commensurate with the patient's age. Mild periventricular white matter changes are present likely reflecting the sequela of small vessel ischemia.  Remote lacunar infarct within the right thalamus and left globus pallidus. No abnormal intra or extra-axial mass lesion or fluid collection. No abnormal mass effect or midline shift. No evidence of acute intracranial hemorrhage or infarct. Ventricular size is normal. Cerebellum unremarkable. Vascular: No asymmetric hyperdense vasculature at the skull base. Extensive atherosclerotic calcification within the carotid siphons. Skull: Intact Sinuses/Orbits: Paranasal sinuses are clear. Orbits are unremarkable. Other: Left mastoidectomy has been performed. Additionally, the auditory ossicles are absent suggesting prior resection. Debris or packing material is noted within the resection bed. Right mastoid air cells and middle ear cavity is clear. IMPRESSION: 1. No acute intracranial abnormality. 2. Mild senescent change and remote lacunar infarcts. 3. Postsurgical changes involving the left mastoid air cells and middle ear cavity. Debris versus residual packing material within the a resection bed. Electronically Signed   By: Fidela Salisbury M.D.   On: 02/03/2022 23:17   DG Chest Portable 1 View  Result Date: 02/03/2022 CLINICAL DATA:  Sob.  Recent covid. Cough, sob, nausea, vomiting. EXAM: PORTABLE CHEST 1 VIEW COMPARISON:  Chest x-ray 06/10/2021 FINDINGS: The heart and mediastinal contours are unchanged. Aortic calcification. No focal consolidation. No pulmonary edema. No pleural effusion. No pneumothorax. No acute osseous abnormality. Right shoulder rotator cuff anchor suture. IMPRESSION: 1. No active disease. 2.  Aortic Atherosclerosis (ICD10-I70.0). Electronically Signed   By: Iven Finn M.D.   On: 02/03/2022 22:45     Assessment/Plan CT scan without contrast does show an impressive amount of calcification around the SMA and both renals.  The celiac seems to be affected to a lesser degree.  However, given her acute kidney injury with recent recovery and her lack of abdominal pain (and looking at the tomogram  profound weight loss does not seem to be the issue) in addition to the fact that her BP has been stable I would advocate for outpatient follow-up and I can get a duplex ultrasound to assess the velocity shifts and the hemodynamic effects.  I would not plan for any vascular interventions at this time   Hortencia Pilar, MD  02/13/2022 12:47 PM

## 2022-02-13 NOTE — TOC Progression Note (Signed)
Transition of Care [facility]) - Progression Note    Patient Details  Name: KYRENE LONGAN MRN: [id number] Date of Birth: 02/12/1932  [facility] CM/SW Contact  Shelbie Hutching, RN Phone Number: 02/13/2022, 10:44 AM  Clinical Narrative:     RNCM left a message for daughter, Manuela Schwartz, to return call.  Last day of COVID isolation is tomorrow.  [facility] has not offered a bed.  Daughter returned call, she reports that she thought that she was told [facility] did offer a bed.  RNCM will reach out to [facility] and daughter is going to call [facility] again.  [facility] is still her first preference.    Expected Discharge Plan: [place] Barriers to Discharge: Continued Medical Work up  Expected Discharge Plan and Services Expected Discharge Plan: [place]   Discharge Planning Services: CM Consult Post Acute Care Choice: [place] Living arrangements for the past 2 months: Single Family Home                 DME Arranged: N/A DME Agency: NA       HH Arranged: NA HH Agency: NA         Social Determinants of Health (SDOH) Interventions    Readmission Risk Interventions     No data to display

## 2022-02-13 NOTE — Progress Notes (Signed)
Daughter Barney Drain took pt's hearing aid back home so it is safe at home, pt able to hear when spoken too loud and clearly.

## 2022-02-13 NOTE — Progress Notes (Signed)
Report called to [employer] on 2A. Manuela Schwartz pt's daughter called and notified of pt's new room number.

## 2022-02-13 NOTE — Progress Notes (Signed)
[place] NOTE       Patient ID: Martha Walker MRN: [id number] DOB/AGE: 01-Jan-1932 88 y.o.  Admit date: 02/03/2022 Referring Physician Dr. Tawanna Solo Primary Physician Dr. Ola Spurr  Primary Cardiologist Dr. Nehemiah Massed  Reason for Consultation bradycardia   HPI: Martha Walker is an 88yoF with a PMH of dementia, CKD 3, type 2 diabetes, hyperlipidemia, hypertension, carotid artery stenosis who presented to [facility] ED from her assisted living facility on 02/03/2022 with progressive weakness, N/V/D, and confusion above her baseline.  She was positive for COVID-19 on admission with a revealed significant leukocytes for which acute cystitis was suspected.  She is under a 10-day quarantine prior to rehab placement.  During her hospitalization she has been bradycardic with heart rate between 30s - 60s with concern for increasing frequency of sinus pauses, the longest lasting 4-5 seconds. She underwent successful Micra leadless pacemaker placement on 9/12 with Dr. Saralyn Pilar.   Interval History: -s/p Micra PPM yesterday using left femoral vein approach -No events on telemetry, primarily ventricular pacing at 60 bpm -No family at bedside, she had some discomfort primary when I was removing the old tegaderm from the right groin, but otherwise offers no complaints  Review of systems limited by patient's baseline dementia  Past Medical History:  Diagnosis Date   Ankle edema, bilateral    Atrophic vaginitis    B12 deficiency    Bilateral carotid artery stenosis    Chronic anemia    CKD stage 3 due to type 2 diabetes mellitus ([place])    Dementia (HCC)    Diabetes mellitus without complication ([place])    Diverticulitis    Hyperlipidemia    Hypertension    Osteoporosis    PVD (peripheral vascular disease) ([place])    Vitamin D deficiency     Past Surgical History:  Procedure Laterality Date   CATARACT EXTRACTION W/PHACO Right 02/21/2021   Procedure: CATARACT EXTRACTION PHACO AND  INTRAOCULAR LENS PLACEMENT ([place]) RIGHT KAHOOK DUAL BLADE GONIOTOMY;  Surgeon: Leandrew Koyanagi, MD;  Location: [facility];  Service: Ophthalmology;  Laterality: Right;  12.45 01:20.1   PACEMAKER LEADLESS INSERTION N/A 02/12/2022   Procedure: PACEMAKER LEADLESS INSERTION;  Surgeon: Isaias Cowman, MD;  Location: [facility];  Service: Cardiovascular;  Laterality: N/A;  8am please    Medications Prior to Admission  Medication Sig Dispense Refill Last Dose   acetaminophen (TYLENOL) 325 MG tablet Take by mouth.   prn at prn   ammonium lactate (AMLACTIN) 12 % cream Apply 1 Application topically as directed.   unknown at unknown   brimonidine (ALPHAGAN) 0.2 % ophthalmic solution Place 1 drop into both eyes 2 (two) times daily.   unknown at unknown   cetirizine (ZYRTEC) 10 MG tablet Take 10 mg by mouth daily.   unknown at unknown   Cholecalciferol 1.25 MG (50000 UT) capsule Take 50,000 Units by mouth every 7 (seven) days.   unknown at unknown   diclofenac Sodium (VOLTAREN) 1 % GEL Apply 2 g topically 4 (four) times daily as needed.   prn at prn   docusate sodium (COLACE) 100 MG capsule Take 100 mg by mouth daily.   unknown at unknown   dorzolamide-timolol (COSOPT) 22.3-6.8 MG/ML ophthalmic solution 1 drop 2 (two) times daily.   unknown at unknown   FARXIGA 10 MG TABS tablet Take 10 mg by mouth daily.   unknown at unknown   GAVILAX 17 GM/SCOOP powder Take by mouth.   unknown at unknown   hydrALAZINE (APRESOLINE)  25 MG tablet Take 25 mg by mouth 2 (two) times daily.   unknown at unknown   ketoconazole (NIZORAL) 2 % cream Apply 1 Application topically as directed. Apply as directed once or twice daily.   unknown at unknown   ketoconazole (NIZORAL) 2 % shampoo Massage into scalp and let sit 10 minutes before washing out twice weekly. 120 mL 2 unknown at unknown   latanoprost (XALATAN) 0.005 % ophthalmic solution Place 1 drop into both eyes at bedtime.   unknown at unknown    Latanoprostene Bunod (VYZULTA) 0.024 % SOLN Apply to eye.   unknown at unknown   loperamide (IMODIUM) 2 MG capsule Take 2 mg by mouth as needed for diarrhea or loose stools.   prn at prn   magnesium hydroxide (MILK OF MAGNESIA) 400 MG/5ML suspension Take 30 mLs by mouth daily as needed for mild constipation.   prn at prn   memantine (NAMENDA) 5 MG tablet Take 5 mg by mouth 2 (two) times daily.   unknown at unknown   metFORMIN (GLUCOPHAGE) 500 MG tablet Take 500 mg by mouth 2 (two) times daily with a meal.   unknown at unknown   mirabegron ER (MYRBETRIQ) 50 MG TB24 tablet Take 1 tablet (50 mg total) by mouth daily. 30 tablet 11 unknown at unknown   rosuvastatin (CRESTOR) 10 MG tablet Take 10 mg by mouth daily.   unknown at unknown   SENNA-TABS 8.6 MG tablet Take by mouth daily as needed.   prn at prn   triamcinolone ointment (KENALOG) 0.1 % Apply to affected areas twice daily as needed for itchy rash at back. Avoid applying to face, groin, and axilla. Use as directed. Risk of skin atrophy with long-term use reviewed. 80 g 2 prn at prn   valsartan (DIOVAN) 80 MG tablet Take 80 mg by mouth daily.   unknown at unknown   vitamin B-12 (CYANOCOBALAMIN) 1000 MCG tablet Take 1,000 mcg by mouth daily.   unknown at unknown   alum & mag hydroxide-simeth (MAALOX/MYLANTA) 200-200-20 MG/5ML suspension Take 30 mLs by mouth every 6 (six) hours as needed for indigestion or heartburn.   prn at prn   Blood Glucose Monitoring Suppl (FIFTY50 GLUCOSE METER 2.0) w/Device KIT Use as directed. DX:E11.9      Social History   Socioeconomic History   Marital status: Widowed    Spouse name: Not on file   Number of children: Not on file   Years of education: Not on file   Highest education level: Not on file  Occupational History   Not on file  Tobacco Use   Smoking status: Never   Smokeless tobacco: Never  Vaping Use   Vaping Use: Never used  Substance and Sexual Activity   Alcohol use: Never   Drug use: Never    Sexual activity: Not on file  Other Topics Concern   Not on file  Social History Narrative   Lives at assisted living facility- [facility].   Social Determinants of Health   Financial Resource Strain: Not on file  Food Insecurity: No Food Insecurity (02/11/2022)   Hunger Vital Sign    Worried About Running Out of Food in the Last Year: Never true    Ran Out of Food in the Last Year: Never true  Transportation Needs: No Transportation Needs (02/11/2022)   PRAPARE - Hydrologist (Medical): No    Lack of Transportation (Non-Medical): No  Physical Activity: Not on file  Stress:  Not on file  Social Connections: Not on file  Intimate Partner Violence: Not At Risk (02/11/2022)   Humiliation, Afraid, Rape, and Kick questionnaire    Fear of Current or Ex-Partner: No    Emotionally Abused: No    Physically Abused: No    Sexually Abused: No    No family history on file.   PHYSICAL EXAM General: Elderly caucasian female, in no acute distress.  Laying flat in hospital bed with nurse at bedside. HEENT:  Normocephalic and atraumatic. Neck:  No JVD.  Lungs: Normal respiratory effort on room air. Decreased breath sounds bilaterally without appreciable crackles or wheezes Heart: Paced rhythm, Normal S1 and S2 without gallops or murmurs.  Abdomen: Non-distended appearing.  Msk: Normal strength and tone for age. Extremities: Warm and well perfused. No clubbing, cyanosis. No peripheral edema.  R groin: With clear Tegaderm and dried blood from attempted access site L groin: Figure-of-eight suture removed at bedside, femoral vein access without apparent aneurysm, significant erythema or swelling, or active bleeding.  Clean 2 x 2 gauze and Tegaderm reapplied. Neuro: Alert and oriented  Psych:  Answers simple questions.  Labs: Basic Metabolic Panel: Recent Labs    02/11/22 0528 02/13/22 0402  NA 139 142  K 4.9 4.3  CL 110 113*  CO2 26 25  GLUCOSE 144* 138*  BUN 37*  33*  CREATININE 1.30* 1.17*  CALCIUM 9.8 9.4    Liver Function Tests: No results for input(s): "AST", "ALT", "ALKPHOS", "BILITOT", "PROT", "ALBUMIN" in the last 72 hours.  No results for input(s): "LIPASE", "AMYLASE" in the last 72 hours. CBC: Recent Labs    02/11/22 0528 02/13/22 0402  WBC 5.0 6.4  HGB 9.6* 8.1*  HCT 30.1* 26.1*  MCV 90.9 92.6  PLT 238 205    Cardiac Enzymes: No results for input(s): "CKTOTAL", "CKMB", "CKMBINDEX", "TROPONINIHS" in the last 72 hours.  BNP: Invalid input(s): "POCBNP" D-Dimer: No results for input(s): "DDIMER" in the last 72 hours.  Hemoglobin A1C: No results for input(s): "HGBA1C" in the last 72 hours. Fasting Lipid Panel: No results for input(s): "CHOL", "HDL", "LDLCALC", "TRIG", "CHOLHDL", "LDLDIRECT" in the last 72 hours. Thyroid Function Tests: No results for input(s): "TSH", "T4TOTAL", "T3FREE", "THYROIDAB" in the last 72 hours.  Invalid input(s): "FREET3" Anemia Panel: No results for input(s): "VITAMINB12", "FOLATE", "FERRITIN", "TIBC", "IRON", "RETICCTPCT" in the last 72 hours.   EP PPM/ICD IMPLANT  Result Date: 02/12/2022 Successful Micra AV leadless pacemaker implantation     Radiology: EP PPM/ICD IMPLANT  Result Date: 02/12/2022 Successful Micra AV leadless pacemaker implantation   CT ABDOMEN PELVIS WO CONTRAST  Result Date: 02/03/2022 CLINICAL DATA:  Abdominal pain, acute, nonlocalized EXAM: CT ABDOMEN AND PELVIS WITHOUT CONTRAST TECHNIQUE: Multidetector CT imaging of the abdomen and pelvis was performed following the standard protocol without IV contrast. RADIATION DOSE REDUCTION: This exam was performed according to the departmental dose-optimization program which includes automated exposure control, adjustment of the mA and/or kV according to patient size and/or use of iterative reconstruction technique. COMPARISON:  None Available. FINDINGS: Lower chest: Moderate right coronary artery calcification. Global cardiac size  within normal limits. No acute abnormality. Hepatobiliary: Cholelithiasis without pericholecystic inflammatory change. Liver unremarkable on this noncontrast examination. No intra or extrahepatic biliary ductal dilation. Pancreas: Unremarkable Spleen: Unremarkable Adrenals/Urinary Tract: Adrenal glands are unremarkable. Kidneys are normal, without renal calculi, focal lesion, or hydronephrosis. Bladder is unremarkable. Stomach/Bowel: Severe distal colonic diverticulosis involving the distal transverse, descending, and sigmoid colon. No superimposed acute inflammatory change. Stomach, small  bowel, and large bowel are unremarkable. Appendix normal. No free intraperitoneal gas or fluid. Vascular/Lymphatic: Extensive aortoiliac atherosclerotic calcification. No aortic aneurysm. Particularly prominent atherosclerotic calcification involving the mesenteric and renal artery origins, however, the degree of stenosis is not well assessed on this noncontrast examination. No pathologic adenopathy within the abdomen and pelvis. Reproductive: Status post hysterectomy. No adnexal masses. Other: No abdominal wall hernia.  Rectum unremarkable. Musculoskeletal: Remote inferior endplate fracture V78 with no significant loss of height. No acute bone abnormality. Degenerative changes are seen within the lumbar spine. No lytic or blastic bone lesion. IMPRESSION: 1. No acute intra-abdominal pathology identified. No definite radiographic explanation for the patient's reported symptoms. 2. Moderate right coronary artery calcification. 3. Cholelithiasis. 4. Severe distal colonic diverticulosis without superimposed acute inflammatory change. 5. Peripheral vascular disease. Suspected hemodynamically significant mesenteric and renal artery stenoses. If there is clinical evidence of chronic mesenteric ischemia or hemodynamically significant renal artery stenosis, CT arteriography may be more helpful for further evaluation. Aortic Atherosclerosis  (ICD10-I70.0). Electronically Signed   By: Fidela Salisbury M.D.   On: 02/03/2022 23:26   CT HEAD WO CONTRAST (5MM)  Result Date: 02/03/2022 CLINICAL DATA:  Head trauma, minor (Age >= 65y). Altered mental status. EXAM: CT HEAD WITHOUT CONTRAST TECHNIQUE: Contiguous axial images were obtained from the base of the skull through the vertex without intravenous contrast. RADIATION DOSE REDUCTION: This exam was performed according to the departmental dose-optimization program which includes automated exposure control, adjustment of the mA and/or kV according to patient size and/or use of iterative reconstruction technique. COMPARISON:  None Available. FINDINGS: Brain: Normal anatomic configuration. Parenchymal volume loss is commensurate with the patient's age. Mild periventricular white matter changes are present likely reflecting the sequela of small vessel ischemia. Remote lacunar infarct within the right thalamus and left globus pallidus. No abnormal intra or extra-axial mass lesion or fluid collection. No abnormal mass effect or midline shift. No evidence of acute intracranial hemorrhage or infarct. Ventricular size is normal. Cerebellum unremarkable. Vascular: No asymmetric hyperdense vasculature at the skull base. Extensive atherosclerotic calcification within the carotid siphons. Skull: Intact Sinuses/Orbits: Paranasal sinuses are clear. Orbits are unremarkable. Other: Left mastoidectomy has been performed. Additionally, the auditory ossicles are absent suggesting prior resection. Debris or packing material is noted within the resection bed. Right mastoid air cells and middle ear cavity is clear. IMPRESSION: 1. No acute intracranial abnormality. 2. Mild senescent change and remote lacunar infarcts. 3. Postsurgical changes involving the left mastoid air cells and middle ear cavity. Debris versus residual packing material within the a resection bed. Electronically Signed   By: Fidela Salisbury M.D.   On: 02/03/2022  23:17   DG Chest Portable 1 View  Result Date: 02/03/2022 CLINICAL DATA:  Sob.  Recent covid. Cough, sob, nausea, vomiting. EXAM: PORTABLE CHEST 1 VIEW COMPARISON:  Chest x-ray 06/10/2021 FINDINGS: The heart and mediastinal contours are unchanged. Aortic calcification. No focal consolidation. No pulmonary edema. No pleural effusion. No pneumothorax. No acute osseous abnormality. Right shoulder rotator cuff anchor suture. IMPRESSION: 1. No active disease. 2.  Aortic Atherosclerosis (ICD10-I70.0). Electronically Signed   By: Iven Finn M.D.   On: 02/03/2022 22:45    ECHO 05/10/2019 IMPRESSIONS    1. Left ventricular ejection fraction, by visual estimation, is 60 to  65%. The left ventricle has normal function. There is no left ventricular  hypertrophy.   2. Global right ventricle has normal systolic function.The right  ventricular size is normal. No increase in right ventricular wall  thickness.   3. Left atrial size was normal.   4. Right atrial size was normal.   5. The mitral valve is normal in structure. Mild to moderate mitral valve  regurgitation. No evidence of mitral stenosis.   6. The tricuspid valve is normal in structure. Tricuspid valve  regurgitation is mild.   7. The aortic valve is normal in structure. Aortic valve regurgitation is  not visualized. No evidence of aortic valve sclerosis or stenosis.   8. The pulmonic valve was normal in structure. Pulmonic valve  regurgitation is not visualized.   9. Mildly elevated pulmonary artery systolic pressure.  10. The inferior vena cava is normal in size with greater than 50%  respiratory variability, suggesting right atrial pressure of 3 mmHg.   FINDINGS   Left Ventricle: Left ventricular ejection fraction, by visual estimation,  is 60 to 65%. The left ventricle has normal function. There is no left  ventricular hypertrophy. Normal left atrial pressure.   Right Ventricle: The right ventricular size is normal. No increase in   right ventricular wall thickness. Global RV systolic function is has  normal systolic function. The tricuspid regurgitant velocity is 2.67 m/s,  and with an assumed right atrial pressure   of 10 mmHg, the estimated right ventricular systolic pressure is mildly  elevated at 38.5 mmHg.   Left Atrium: Left atrial size was normal in size.   Right Atrium: Right atrial size was normal in size   Pericardium: There is no evidence of pericardial effusion.   Mitral Valve: The mitral valve is normal in structure. No evidence of  mitral valve stenosis by observation. MV peak gradient, 4.0 mmHg. Mild to  moderate mitral valve regurgitation.   Tricuspid Valve: The tricuspid valve is normal in structure. Tricuspid  valve regurgitation is mild.   Aortic Valve: The aortic valve is normal in structure. Aortic valve  regurgitation is not visualized. The aortic valve is structurally normal,  with no evidence of sclerosis or stenosis. Aortic valve mean gradient  measures 4.0 mmHg. Aortic valve peak  gradient measures 8.5 mmHg. Aortic valve area, by VTI measures 1.59 cm.   Pulmonic Valve: The pulmonic valve was normal in structure. Pulmonic valve  regurgitation is not visualized.   Aorta: The aortic root, ascending aorta and aortic arch are all  structurally normal, with no evidence of dilitation or obstruction.   Venous: The inferior vena cava is normal in size with greater than 50%  respiratory variability, suggesting right atrial pressure of 3 mmHg.   IAS/Shunts: No atrial level shunt detected by color flow Doppler. There is  no evidence of a patent foramen ovale. No ventricular septal defect is  seen or detected. There is no evidence of an atrial septal defect.      LEFT VENTRICLE  PLAX 2D  LVIDd:         4.95 cm  Diastology  LVIDs:         3.13 cm  LV e' lateral:   9.03 cm/s  LV PW:         0.99 cm  LV E/e' lateral: 9.5  LV IVS:        0.66 cm  LV e' medial:    4.03 cm/s  LVOT diam:      1.80 cm  LV E/e' medial:  21.3  LV SV:         77 ml  LV SV Index:   37.33  LVOT Area:     2.54 cm  LEFT ATRIUM             Index  LA diam:        3.70 cm 1.85 cm/m  LA Vol (A2C):   44.6 ml 22.30 ml/m  LA Vol (A4C):   70.1 ml 35.06 ml/m  LA Biplane Vol: 56.6 ml 28.30 ml/m   AORTIC VALVE                   PULMONIC VALVE  AV Area (Vmax):    1.80 cm    PV Vmax:       1.05 m/s  AV Area (Vmean):   1.71 cm    PV Vmean:      73.500 cm/s  AV Area (VTI):     1.59 cm    PV VTI:        0.263 m  AV Vmax:           146.00 cm/s PV Peak grad:  4.4 mmHg  AV Vmean:          91.200 cm/s PV Mean grad:  2.0 mmHg  AV VTI:            0.391 m  AV Peak Grad:      8.5 mmHg  AV Mean Grad:      4.0 mmHg  LVOT Vmax:         103.00 cm/s  LVOT Vmean:        61.400 cm/s  LVOT VTI:          0.244 m  LVOT/AV VTI ratio: 0.62     AORTA  Ao Root diam: 3.00 cm   MITRAL VALVE                        TRICUSPID VALVE  MV Area (PHT): 2.60 cm             TR Peak grad:   28.5 mmHg  MV Peak grad:  4.0 mmHg             TR Vmax:        267.00 cm/s  MV Mean grad:  1.0 mmHg  MV Vmax:       1.00 m/s             SHUNTS  MV Vmean:      55.1 cm/s            Systemic VTI:  0.24 m  MV VTI:        0.37 m               Systemic Diam: 1.80 cm  MV PHT:        84.68 msec  MV Decel Time: 292 msec  MV E velocity: 85.90 cm/s 103 cm/s  MV A velocity: 69.40 cm/s 70.3 cm/s  MV E/A ratio:  1.24       1.5      Isaias Cowman MD  Electronically signed by Isaias Cowman MD  Signature Date/Time: 05/11/2019/1:17:54 PM   TELEMETRY reviewed by me (LT) 02/13/2022 : Ventricular paced rate mostly 60  EKG reviewed by me: 9/4 Sinus bradycardia with PACs RBBB rate 50; 9/9 sinus bradycardia with artifact and PACs, RBBB rate 48 9/11 junctional bradycardia rate 46 RBBB  Data reviewed by me (LT) 02/13/2022: Palliative care note, hospitalist progress note, CBC, BMP, vitals, telemetry, discussed with nursing and  hospitalist  ASSESSMENT AND PLAN:  Martha Walker is an 88yoF with a PMH of dementia,  CKD 3, type 2 diabetes, hyperlipidemia, hypertension, carotid artery stenosis who presented to [facility] ED from her assisted living facility on 02/03/2022 with progressive weakness, N/V/D, and confusion above her baseline.  She was positive for COVID-19 on admission with a revealed significant leukocytes for which acute cystitis was suspected.  She is under a 10-day quarantine prior to rehab placement.  During her hospitalization she has been bradycardic with heart rate between 30s - 60s with concern for increasing frequency of sinus pauses, the longest lasting 5.6 seconds.   #Acute cystitis #COVID-19 #Junctional bradycardia with sinus pauses, concern for sick sinus syndrome s/p [employer] Micra Leadless Pacemaker placement 02/12/22 The patient presented from her assisted living facility with generalized weakness, nausea, vomiting, diarrhea, and confusion above her baseline dementia.  UA consistent with acute cystitis, incidentally COVID-19 positive on admission.  Telemetry during admission has shown sinus bradycardia with heart rates in the mid 40s mostly, appropriately increasing to the 50s and 60s with activity.  Although during her hospital course the frequency of sinus pauses increased, repeat EKG confirmed a junctional rhythm.  -Agree with current therapy for treatment of UTI and COVID-19 -Continuous monitoring on telemetry while inpatient -Monitor and replete electrolytes as needed to maintain K >4, mag >2 -aftercare instructions discussed with the patient and placed in chart -No further cardiac diagnostics necessary.  -Follow-up with Dr. Nehemiah Massed in office in 1 week. -Currently pending 10-day COVID quarantine requirement prior to rehab/SNF placement.  This patient's plan of care was discussed and created with Dr. Saralyn Pilar and he is in agreement.  Signed: Tristan Schroeder , PA-C 02/13/2022, 7:43 AM [facility]

## 2022-02-13 NOTE — TOC Progression Note (Signed)
Transition of Care [facility]) - Progression Note    Patient Details  Name: Martha Walker MRN: [id number] Date of Birth: Dec 20, 1931  [facility] CM/SW Contact  Shelbie Hutching, RN Phone Number: 02/13/2022, 4:27 PM  Clinical Narrative:    Daughter requested that I send referral over to [facility] again, she reports speaking to [place] over at [facility].  Referral resent to [facility].   Expected Discharge Plan: [place] Barriers to Discharge: Continued Medical Work up  Expected Discharge Plan and Services Expected Discharge Plan: [place]   Discharge Planning Services: CM Consult Post Acute Care Choice: [place] Living arrangements for the past 2 months: Single Family Home                 DME Arranged: N/A DME Agency: NA       HH Arranged: NA HH Agency: NA         Social Determinants of Health (SDOH) Interventions    Readmission Risk Interventions     No data to display

## 2022-02-13 NOTE — Progress Notes (Signed)
OT Cancellation Note  Patient Details Name: Martha Walker MRN: [id number] DOB: 1932-04-24   Cancelled Treatment:    Reason Eval/Treat Not Completed: Medical issues which prohibited therapy. Per chart review, pt transferred to ICU for higher level of care and planned procedure. Per therapy protocol, OT to complete orders at this time and will await new orders when pt is able to participate.   Darleen Crocker, MS, OTR/L , CBIS ascom [phone]  02/13/22, 8:49 AM

## 2022-02-13 NOTE — Progress Notes (Signed)
PROGRESS NOTE  Martha Walker  HEN:[id number] DOB: 1932/04/02 DOA: 02/03/2022 PCP: Leonel Ramsay, MD   Brief Narrative: Patient is a 88 year old female with history of carotid stenosis, CKD stage IIIa, diabetes type 2, hyperlipidemia, hypertension, peripheral vascular disease who presented from ALF with complaint of progressive weakness, nausea, vomiting, diarrhea, confusion.  On presentation she was hemodynamically stable, on room air.  Lab work showed creatinine of 2.15, baseline creatinine around 1.3.  COVID screen test positive.  UA showed significant leukocytes, UTI suspected, treated with ceftriaxone, completed.  Hospital course remarkable for intermittent  bradycardia.   Cardiology consulted, initially had no plan for intervention, but due to worsening and persistent bradycardia down to 20's and sinus pauses, pacemaker was placed on 9/12.  Closely monitoring in ICU at this time.  PT/OT consulted for weakness, SNF recommended but requires 10 day isolation for Covid.   Assessment & Plan:  Principal Problem:   COVID-19 Active Problems:   Bradycardia   Pressure injury of skin   Malnutrition of moderate degree  COVID infection: Presented with nausea, vomiting, diarrhea, weakness. Suspected  COVID gastroenteritis.  The symptoms have resolved now.  S/p paxlovid. Today is day 10 of isolation   UTI suspected / Lactobacillus on urine culture:  UA was suggestive  of UTI with some few leukocytes, but unclear if pt symptomatic due to dementia. Treated with 3 days empiric ceftriaxone.  Urine culture grew lactobacillus spp, doubt pathogenic but colonization.  Pt is afebrile, no leukocytosis.  Monitor clinically, consider repeat cutlure and start antibiotic if s/sx's of infection.   Bradycardia: She is mostly asymptomatic.  Patient heart rate runs low to 40.  Heart rate fluctuating between 40-70.  Central telemetry has reported occasional pauses of about 2 seconds.  Per chart review, during  last admission, she was seen by Dr. 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  Cardiology consulted, initially recommended conservative management.  9/9 -- rapid called, HR dipping into 20's.  Given 0.5 mg atropine, will have PRN available if needed Cardiology updated and will see her today.  No indication for temporary pacing given otherwise stable, per cardiology. 9/10-11 -- required atropine a few times for HR in 20's, but stable and asymptomatic 9/12 -- pacemaker placed this AM for likely sick sinus syndrome 9/13: stable for d/c from cardiology perspective, f/u dr. Nehemiah Massed 1 week   Confusion/Dementia : Likely from metabolic encephalopathy from UTI, COVID.  As per the daughter, she is intermittently confused, she likely has some underlying dementia / cognitive impairment. Takes memantine. Continue supportive care, frequent reorientation, delirium precautions.  CT head showed remote lacunar infarcts, no acute findings. --Palliative consulted for [place] discussions -  remains full code, full scope per daughter.   AKI in CKD stage IIIa: Baseline creatinine 1.3.  Presented with creatinine in the range of 2.   Now back to baseline with IV fluid, IV fluid discontinued   Bilateral carotid stenosis: On Plavix, statin  Anemia: hgb has trended to 8.1, no bleeding noted, will continue to trend for now   Diabetes type 2: On metformin at home.  Continue sliding scale insulin.  Blood sugars   Hyperlipidemia: On statin at home, can re-start   Hypertension: Currently blood pressure stable.  Hold Hydralazine.  ARB was held due to AKI, now held for soft BP's   Peripheral vascular disease: CT abdomen/pelvis showed hemodynamically significant mesenteric and renal artery stenoses.  Will message vascular to review. Appears asymtpomatic  Weakness/disposition: Patient is from ALF.  PT/OT consulted, SNF recommended.  TOC following for placement.  Stage II sacral wound - POA.  I agree with  the wound description as outlined below. Continue diligent wound care, frequent repositioning and monitor site closely for signs of infection. Nutrition Problem: Moderate Malnutrition Etiology: chronic illness Pressure Injury 02/04/22 Sacrum Medial Stage 2 -  Partial thickness loss of dermis presenting as a shallow open injury with a red, pink wound bed without slough. 3x3 open area to skin (Active)  02/04/22 0430  Location: Sacrum  Location Orientation: Medial  Staging: Stage 2 -  Partial thickness loss of dermis presenting as a shallow open injury with a red, pink wound bed without slough.  Wound Description (Comments): 3x3 open area to skin  Present on Admission: Yes  Dressing Type Foam - Lift dressing to assess site every shift 02/12/22 2000    DVT prophylaxis:enoxaparin (LOVENOX) injection 30 mg Start: 02/08/22 2200     Code Status: Full Code  Family Communication: Daughter updated telephonically 9/13  Patient status: Inpatient  Patient is from : ALF  Anticipated discharge to: SNF  Estimated DC date: Requires 10-day isolation for COVID-positive (earliest d/c 9/14).    Consultants: Cardiology  Procedures:None    Subjective: Feeling well, no complaints   Objective: Vitals:   02/13/22 0800 02/13/22 0900 02/13/22 1000 02/13/22 1100  BP: 108/63 138/63 (!) 92/55 (!) 108/58  Pulse: 63 67 (!) 59 (!) 58  Resp: '12 16 15 17  '$ Temp: 98.9 F (37.2 C)     TempSrc: Oral     SpO2: 100% 99% 100% 100%  Weight:      Height:       Filed Weights   02/04/22 0406 02/11/22 0022 02/11/22 1555  Weight: 77 kg 65.7 kg 65.4 kg    Examination:  General exam: sedated & sleeping comfortably no acute distress Respiratory system: on room air, normal respiratory effort. Cardiovascular system: RRR, no pedal edema Central nervous system: A&O x self. no gross focal neurologic deficits Extremities: moves all, no edema, normal tone Skin: dry, intact, no rashes seen, sacral skin injury  image below:      Data Reviewed:    Labs notable for --- CBG's at goal, negative MRSA       LOS: 9 days   Desma Maxim, DO [facility] P9/13/2023, 12:19 PM

## 2022-02-14 LAB — CBC WITH DIFFERENTIAL/PLATELET
Abs Immature Granulocytes: 0.02 10*3/uL (ref 0.00–0.07)
Basophils Absolute: 0 10*3/uL (ref 0.0–0.1)
Basophils Relative: 0 %
Eosinophils Absolute: 0.1 10*3/uL (ref 0.0–0.5)
Eosinophils Relative: 2 %
HCT: 24.5 % — ABNORMAL LOW (ref 36.0–46.0)
Hemoglobin: 7.6 g/dL — ABNORMAL LOW (ref 12.0–15.0)
Immature Granulocytes: 0 %
Lymphocytes Relative: 34 %
Lymphs Abs: 1.9 10*3/uL (ref 0.7–4.0)
MCH: 28.6 pg (ref 26.0–34.0)
MCHC: 31 g/dL (ref 30.0–36.0)
MCV: 92.1 fL (ref 80.0–100.0)
Monocytes Absolute: 0.6 10*3/uL (ref 0.1–1.0)
Monocytes Relative: 10 %
Neutro Abs: 2.9 10*3/uL (ref 1.7–7.7)
Neutrophils Relative %: 54 %
Platelets: 198 10*3/uL (ref 150–400)
RBC: 2.66 MIL/uL — ABNORMAL LOW (ref 3.87–5.11)
RDW: 14 % (ref 11.5–15.5)
WBC: 5.5 10*3/uL (ref 4.0–10.5)
nRBC: 0 % (ref 0.0–0.2)

## 2022-02-14 LAB — CBC
HCT: 22.9 % — ABNORMAL LOW (ref 36.0–46.0)
Hemoglobin: 7.1 g/dL — ABNORMAL LOW (ref 12.0–15.0)
MCH: 28.6 pg (ref 26.0–34.0)
MCHC: 31 g/dL (ref 30.0–36.0)
MCV: 92.3 fL (ref 80.0–100.0)
Platelets: 187 10*3/uL (ref 150–400)
RBC: 2.48 MIL/uL — ABNORMAL LOW (ref 3.87–5.11)
RDW: 13.8 % (ref 11.5–15.5)
WBC: 5.2 10*3/uL (ref 4.0–10.5)
nRBC: 0 % (ref 0.0–0.2)

## 2022-02-14 LAB — GLUCOSE, CAPILLARY
Glucose-Capillary: 160 mg/dL — ABNORMAL HIGH (ref 70–99)
Glucose-Capillary: 208 mg/dL — ABNORMAL HIGH (ref 70–99)
Glucose-Capillary: 136 mg/dL — ABNORMAL HIGH (ref 70–99)
Glucose-Capillary: 182 mg/dL — ABNORMAL HIGH (ref 70–99)

## 2022-02-14 LAB — TECHNOLOGIST SMEAR REVIEW: Plt Morphology: NORMAL

## 2022-02-14 LAB — LACTATE DEHYDROGENASE: LDH: 121 U/L (ref 98–192)

## 2022-02-14 LAB — IRON AND TIBC
Iron: 60 ug/dL (ref 28–170)
Saturation Ratios: 27 % (ref 10.4–31.8)
TIBC: 220 ug/dL — ABNORMAL LOW (ref 250–450)
UIBC: 160 ug/dL

## 2022-02-14 LAB — RETICULOCYTES
Immature Retic Fract: 24.2 % — ABNORMAL HIGH (ref 2.3–15.9)
RBC.: 2.64 MIL/uL — ABNORMAL LOW (ref 3.87–5.11)
Retic Count, Absolute: 79.5 10*3/uL (ref 19.0–186.0)
Retic Ct Pct: 3 % (ref 0.4–3.1)

## 2022-02-14 LAB — TSH: TSH: 3.739 u[IU]/mL (ref 0.350–4.500)

## 2022-02-14 LAB — FERRITIN: Ferritin: 229 ng/mL (ref 11–307)

## 2022-02-14 LAB — BILIRUBIN, FRACTIONATED(TOT/DIR/INDIR)
Bilirubin, Direct: <0.1 mg/dL (ref 0.0–0.2)
Total Bilirubin: 0.4 mg/dL (ref 0.3–1.2)

## 2022-02-14 LAB — VITAMIN B12: Vitamin B-12: 1063 pg/mL — ABNORMAL HIGH (ref 180–914)

## 2022-02-14 NOTE — Evaluation (Signed)
Occupational Therapy Re-Evaluation Patient Details Name: Martha Walker MRN: [id number] DOB: Aug 09, 1931 Today's Date: 02/14/2022   History of Present Illness Pt is an 88 year old female presending from ALF with complaint of progressive weakness, nausea, vomiting, diarrhea, confusion. Pt found to be covid +. Hospital course remarkable for intermittent  bradycardia.  Cardiology consulted, initially had no plan for intervention, but due to worsening and persistent bradycardia down to 20's and sinus pauses, pacemaker was placed on 9/12   Clinical Impression   Chart reviewed, nurse cleared pt for participation in OT re-evaluation. Pt is alert, oriented to self, place; able to follow one step directions with increased time. Pt presents with deficits in strength, endurance, activity tolerance, all affecting safe and optimal ADL completion. Pt continues to perform below PLOF, requiring RW for safe ADL completion/functional ambulation. MOD A required for sit>stand off of regular toilet height. MIN A required for peri care. Pt is left in bedside chair, all needs met. Discharge recommendation remains appropriate. OT will follow acutely.      Recommendations for follow up therapy are one component of a multi-disciplinary discharge planning process, led by the attending physician.  Recommendations may be updated based on patient status, additional functional criteria and insurance authorization.   Follow Up Recommendations  Skilled nursing-short term rehab (<3 hours/day)    Assistance Recommended at Discharge Frequent or constant Supervision/Assistance  Patient can return home with the following A little help with walking and/or transfers;A lot of help with bathing/dressing/bathroom;Assistance with cooking/housework;Assist for transportation;Help with stairs or ramp for entrance    Functional Status Assessment  Patient has had a recent decline in their functional status and demonstrates the ability to make  significant improvements in function in a reasonable and predictable amount of time.  Equipment Recommendations  BSC/3in1;Other (comment) (2WW)    Recommendations for Other Services       Precautions / Restrictions Precautions Precautions: Fall Restrictions Weight Bearing Restrictions: No      Mobility Bed Mobility Overal bed mobility: Needs Assistance Bed Mobility: Supine to Sit     Supine to sit: Min assist, HOB elevated          Transfers Overall transfer level: Needs assistance Equipment used: Rolling walker (2 wheels) Transfers: Sit to/from Stand Sit to Stand: Min assist, Mod assist           General transfer comment: frequent vcs for technique with RW      Balance Overall balance assessment: Needs assistance Sitting-balance support: Feet supported Sitting balance-Leahy Scale: Good     Standing balance support: Single extremity supported, No upper extremity supported Standing balance-Leahy Scale: Fair                             ADL either performed or assessed with clinical judgement   ADL Overall ADL's : Needs assistance/impaired Eating/Feeding: Set up;Sitting   Grooming: Wash/dry hands;Sitting;Set up       Lower Body Bathing: Minimal assistance;Sit to/from stand;Cueing for sequencing           Toilet Transfer: Minimal assistance;Moderate assistance;Rolling walker (2 wheels);Ambulation Toilet Transfer Details (indicate cue type and reason): STS off toilet with MOD A due to low toilet height, frequent vcs throughout for safety Toileting- Clothing Manipulation and Hygiene: Minimal assistance;Sit to/from stand Toileting - Clothing Manipulation Details (indicate cue type and reason): with RW     Functional mobility during ADLs: Supervision/safety;Min guard;Rolling walker (2 wheels) (approx 20' in room)  Vision Patient Visual Report: No change from baseline       Perception     Praxis      Pertinent Vitals/Pain Pain  Assessment Pain Assessment: No/denies pain     Hand Dominance     Extremity/Trunk Assessment Upper Extremity Assessment Upper Extremity Assessment: Generalized weakness   Lower Extremity Assessment Lower Extremity Assessment: Generalized weakness       Communication     Cognition Arousal/Alertness: Awake/alert Behavior During Therapy: WFL for tasks assessed/performed Overall Cognitive Status: No family/caregiver present to determine baseline cognitive functioning Area of Impairment: Orientation, Attention, Memory, Following commands, Safety/judgement, Awareness, Problem solving                 Orientation Level: Disoriented to, Date, Situation Current Attention Level: Sustained Memory: Decreased short-term memory Following Commands: Follows one step commands with increased time Safety/Judgement: Decreased awareness of safety, Decreased awareness of deficits Awareness: Emergent Problem Solving: Slow processing, Requires verbal cues, Requires tactile cues General Comments: Pt has dementia per notes, improved orientation and slightly improved awareness of deficits as compared to last OT tx session     General Comments  HR in 60s throughout, spo2 >95% on RA    Exercises     Shoulder Instructions      Home Living Family/patient expects to be discharged to:: Skilled nursing facility                                 Additional Comments: per eval, pt at [facility] feels she may need a higher level of care      Prior Functioning/Environment Prior Level of Function : Needs assist             Mobility Comments: at ILF pt amb with no AD per pt report          OT Problem List: Decreased strength;Decreased activity tolerance;Decreased safety awareness;Impaired balance (sitting and/or standing);Decreased knowledge of use of DME or AE      OT Treatment/Interventions: Self-care/ADL training;Therapeutic exercise;Therapeutic activities;DME and/or AE  instruction;Patient/family education;Balance training;Cognitive remediation/compensation    OT Goals(Current goals can be found in the care plan section) Acute Rehab OT Goals Patient Stated Goal: walk to bathroom OT Goal Formulation: With patient Time For Goal Achievement: 02/28/22 Potential to Achieve Goals: Good  OT Frequency: Min 2X/week    Co-evaluation              AM-PAC OT "6 Clicks" Daily Activity     Outcome Measure Help from another person eating meals?: None Help from another person taking care of personal grooming?: A Little Help from another person toileting, which includes using toliet, bedpan, or urinal?: A Little Help from another person bathing (including washing, rinsing, drying)?: A Little Help from another person to put on and taking off regular upper body clothing?: A Little Help from another person to put on and taking off regular lower body clothing?: A Little 6 Click Score: 19   End of Session Equipment Utilized During Treatment: Rolling walker (2 wheels);Gait belt Nurse Communication: Mobility status  Activity Tolerance: Patient tolerated treatment well Patient left: in chair;with call bell/phone within reach;with chair alarm set  OT Visit Diagnosis: Other abnormalities of gait and mobility (R26.89);Muscle weakness (generalized) (M62.81)                Time: 6222-9798 OT Time Calculation (min): 24 min Charges:  OT General Charges $OT Visit: 1 Visit OT  Evaluation $OT Re-eval: 1 Re-eval OT Treatments $Self Care/Home Management : 8-22 mins  Shanon Payor, OTD OTR/L  02/14/22, 10:56 AM

## 2022-02-14 NOTE — TOC Progression Note (Signed)
Transition of Care [facility]) - Progression Note    Patient Details  Name: RIYAN GAVINA MRN: [id number] Date of Birth: 02/25/1932  [facility] of [facility] CM/SW [place], [employer] Phone Number: 02/14/2022, 9:49 AM  Clinical Narrative:     Patient to dc to [facility] potentially tomorrow if medically cleared, daughter Manuela Schwartz updated and Magda Paganini at [facility] aware.   Expected Discharge Plan: [place] Barriers to Discharge: Continued Medical Work up  Expected Discharge Plan and Services Expected Discharge Plan: [place]   Discharge Planning Services: CM Consult Post Acute Care Choice: [place] Living arrangements for the past 2 months: Single Family Home                 DME Arranged: N/A DME Agency: NA       HH Arranged: NA HH Agency: NA         Social Determinants of Health (SDOH) Interventions    Readmission Risk Interventions     No data to display

## 2022-02-14 NOTE — Care Management Important Message (Signed)
Important Message  Patient Details  Name: Martha Walker MRN: [id number] Date of Birth: 11-13-1931   Medicare Important Message Given:  Yes     Loann Quill 02/14/2022, 11:51 AM

## 2022-02-14 NOTE — Progress Notes (Signed)
PROGRESS NOTE  Martha Walker  WEX:[id number] DOB: 1931/12/12 DOA: 02/03/2022 PCP: Leonel Ramsay, MD   Brief Narrative: Patient is a 88 year old female with history of carotid stenosis, CKD stage IIIa, diabetes type 2, hyperlipidemia, hypertension, peripheral vascular disease who presented from ALF with complaint of progressive weakness, nausea, vomiting, diarrhea, confusion.  On presentation she was hemodynamically stable, on room air.  Lab work showed creatinine of 2.15, baseline creatinine around 1.3.  COVID screen test positive.  UA showed significant leukocytes, UTI suspected, treated with ceftriaxone, completed.  Hospital course remarkable for intermittent  bradycardia.   Cardiology consulted, initially had no plan for intervention, but due to worsening and persistent bradycardia down to 20's and sinus pauses, pacemaker was placed on 9/12.  Closely monitoring in ICU at this time.  PT/OT consulted for weakness, SNF recommended but requires 10 day isolation for Covid.   Assessment & Plan:  Principal Problem:   COVID-19 Active Problems:   Bradycardia   Pressure injury of skin   Malnutrition of moderate degree  COVID infection: Presented with nausea, vomiting, diarrhea, weakness. Suspected  COVID gastroenteritis.  The symptoms have resolved now.  S/p paxlovid. Symptoms resolved, completed isolation 9/13   UTI suspected / Lactobacillus on urine culture:  UA was suggestive  of UTI with some few leukocytes, but unclear if pt symptomatic due to dementia. Treated with 3 days empiric ceftriaxone.  Urine culture grew lactobacillus spp, doubt pathogenic but colonization.  Pt is afebrile, no leukocytosis.  Monitor clinically, consider repeat cutlure and start antibiotic if s/sx's of infection.   Bradycardia: She is mostly asymptomatic.  Patient heart rate runs low to 40.  Heart rate fluctuating between 40-70.  Central telemetry has reported occasional pauses of about 2 seconds.  Per chart  review, during last admission, she was seen by Dr. 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  Cardiology consulted, initially recommended conservative management.  9/9 -- rapid called, HR dipping into 20's.  Given 0.5 mg atropine, will have PRN available if needed Cardiology updated and will see her today.  No indication for temporary pacing given otherwise stable, per cardiology. 9/10-11 -- required atropine a few times for HR in 20's, but stable and asymptomatic 9/12 -- pacemaker placed this AM for likely sick sinus syndrome 9/13: stable for d/c from cardiology perspective, f/u dr. Nehemiah Massed 1 week   Confusion/Dementia : Likely from metabolic encephalopathy from UTI, COVID.  As per the daughter, she is intermittently confused, she likely has some underlying dementia / cognitive impairment. Takes memantine. Continue supportive care, frequent reorientation, delirium precautions.  CT head showed remote lacunar infarcts, no acute findings. --Palliative consulted for [place] discussions -  remains full code, full scope per daughter.   AKI in CKD stage IIIa: Baseline creatinine 1.3.  Presented with creatinine in the range of 2.   Now back to baseline with IV fluid, IV fluid discontinued   Bilateral carotid stenosis: On Plavix, statin  Anemia: hgb has trended to 7s, no overt bleeding, labs not suggestive of blood loss or hemolysis. Daily phlebotomy likely contributing. Will ctm.   Diabetes type 2: On metformin at home.  Continue sliding scale insulin.  Blood sugars   Hyperlipidemia: On statin at home, can re-start   Hypertension: Currently blood pressure stable.  Hold Hydralazine.  ARB was held due to AKI, now held for soft BP's   Peripheral vascular disease: CT abdomen/pelvis showed hemodynamically significant mesenteric and renal artery stenoses. vascular [employer] [facility]) has reviewed, advises outpt f/u,  no further eval inpt  Weakness/disposition: Patient is from ALF.  PT/OT  consulted, SNF recommended.  TOC following for placement.  Stage II sacral wound - POA.  I agree with the wound description as outlined below. Continue diligent wound care, frequent repositioning and monitor site closely for signs of infection. Nutrition Problem: Moderate Malnutrition Etiology: chronic illness Pressure Injury 02/04/22 Sacrum Medial Stage 2 -  Partial thickness loss of dermis presenting as a shallow open injury with a red, pink wound bed without slough. 3x3 open area to skin (Active)  02/04/22 0430  Location: Sacrum  Location Orientation: Medial  Staging: Stage 2 -  Partial thickness loss of dermis presenting as a shallow open injury with a red, pink wound bed without slough.  Wound Description (Comments): 3x3 open area to skin  Present on Admission: Yes  Dressing Type Foam - Lift dressing to assess site every shift 02/14/22 0856    DVT prophylaxis:enoxaparin (LOVENOX) injection 30 mg Start: 02/08/22 2200     Code Status: Full Code  Family Communication: Daughter updated telephonically 9/14  Patient status: Inpatient  Patient is from : ALF  Anticipated discharge to: SNF   Estimated DC date: 9/15 (SNF says can take her tomorrow)  Consultants: Cardiology  Procedures:None    Subjective: Feeling well, no complaints   Objective: Vitals:   02/14/22 0440 02/14/22 0756 02/14/22 1048 02/14/22 1111  BP: (!) 109/49 (!) 108/53  (!) 104/59  Pulse: (!) 59 (!) 58 60 (!) 57  Resp: (!) '23 20  17  '$ Temp:  98.5 F (36.9 C)  98.4 F (36.9 C)  TempSrc:  Oral  Oral  SpO2: 98% 99% 100% 99%  Weight:      Height:       Filed Weights   02/04/22 0406 02/11/22 0022 02/11/22 1555  Weight: 77 kg 65.7 kg 65.4 kg    Examination:  General exam: sedated & sleeping comfortably no acute distress Respiratory system: on room air, normal respiratory effort. Cardiovascular system: RRR, no pedal edema Central nervous system: A&O x self. no gross focal neurologic  deficits Extremities: moves all, no edema, normal tone Skin: dry, intact, no rashes seen, sacral skin injury image below:      Data Reviewed:    Labs notable for --- CBG's at goal, negative MRSA       LOS: 10 days   Desma Maxim, DO [facility] P9/14/2023, 12:05 PM

## 2022-02-14 NOTE — Evaluation (Signed)
Physical Therapy Re-Evaluation Patient Details Name: Martha Walker MRN: [id number] DOB: 05/07/1932 Today's Date: 02/14/2022  History of Present Illness  Pt is an 88 year old female presenting from ALF with complaint of progressive weakness, nausea, vomiting, diarrhea, confusion. Pt found to be covid +. Hospital course remarkable for intermittent  bradycardia.  Cardiology consulted, initially had no plan for intervention, but due to worsening and persistent bradycardia down to 20's and sinus pauses, pacemaker was placed on 9/12.  Clinical Impression  Re-eval post transfer to CCU and pacemaker placement.  Pt did well but is not at her baseline.  She typically does not need AD and is apparently able to walk around at her ALF ab lib.  Pt's O2 and heart rate stable and appropriate t/o the effort of circumambulating the nurses station.  Pt weak and reports some fatigue with the effort but ultimately did better than expected.  Pt still not at her baseline and will require further PT intervention at short term rehab to get back to baseline function.    Recommendations for follow up therapy are one component of a multi-disciplinary discharge planning process, led by the attending physician.  Recommendations may be updated based on patient status, additional functional criteria and insurance authorization.  Follow Up Recommendations Skilled nursing-short term rehab (<3 hours/day) Can patient physically be transported by private vehicle: Yes    Assistance Recommended at Discharge Intermittent Supervision/Assistance  Patient can return home with the following  A little help with bathing/dressing/bathroom;Assistance with cooking/housework;Direct supervision/assist for medications management;Assist for transportation    Equipment Recommendations Rolling walker (2 wheels)  Recommendations for Other Services       Functional Status Assessment Patient has had a recent decline in their functional status and  demonstrates the ability to make significant improvements in function in a reasonable and predictable amount of time.     Precautions / Restrictions Precautions Precautions: Fall Restrictions Weight Bearing Restrictions: No      Mobility  Bed Mobility               General bed mobility comments: NT in recliner pre/post session    Transfers Overall transfer level: Modified independent Equipment used: Rolling walker (2 wheels) Transfers: Sit to/from Stand Sit to Stand: Min guard           General transfer comment: cuing for appropraite set up and sequencing, able to effortfully rise with b/l UEs using arm rests    Ambulation/Gait Ambulation/Gait assistance: Min guard Gait Distance (Feet): 200 Feet Assistive device: Rolling walker (2 wheels)         General Gait Details: Pt was able to maintain relativley consistent candence with ambulation and did not have any overt stagger stepping or LOBs.  Her O2 remained in the high90s on room air, HR stable (now s/p pacer).  She did report some mild fatigue but ultimately did quite well and was not overly reliant on the walker.  Stairs            Wheelchair Mobility    Modified Rankin (Stroke Patients Only)       Balance Overall balance assessment: Needs assistance Sitting-balance support: Feet supported Sitting balance-Leahy Scale: Good Sitting balance - Comments: hunched posture   Standing balance support: Bilateral upper extremity supported Standing balance-Leahy Scale: Fair Standing balance comment: required light use of walker (no AD at baseline) to maintain standing balance t/o session  Pertinent Vitals/Pain Pain Assessment Pain Assessment: Faces Faces Pain Scale: Hurts little more Pain Location: chronic low back pain    Home Living Family/patient expects to be discharged to:: Skilled nursing facility                   Additional Comments: per eval,  pt at [facility] feels she may need a higher level of care    Prior Function Prior Level of Function : Needs assist             Mobility Comments: at [facility] pt apparently ambulated ad lib w/o AD       Hand Dominance        Extremity/Trunk Assessment   Upper Extremity Assessment Upper Extremity Assessment: Generalized weakness    Lower Extremity Assessment Lower Extremity Assessment: Generalized weakness       Communication      Cognition Arousal/Alertness: Awake/alert Behavior During Therapy: WFL for tasks assessed/performed Overall Cognitive Status: No family/caregiver present to determine baseline cognitive functioning - she could not recall the day, month or year but did know she was at [facility].  Did not know name of her ALF.                                          General Comments General comments (skin integrity, edema, etc.): HR in 60s throughout, spo2 >95% on RA    Exercises     Assessment/Plan    PT Assessment Patient needs continued PT services  PT Problem List Decreased strength;Decreased range of motion;Decreased activity tolerance;Decreased balance;Decreased mobility;Decreased knowledge of use of DME;Decreased safety awareness       PT Treatment Interventions DME instruction;Gait training;Stair training;Therapeutic activities;Therapeutic exercise;Functional mobility training;Balance training;Cognitive remediation;Patient/family education    PT Goals (Current goals can be found in the Care Plan section)  Acute Rehab PT Goals Patient Stated Goal: to increase independence PT Goal Formulation: With patient Time For Goal Achievement: 02/27/22 Potential to Achieve Goals: Fair    Frequency Min 2X/week     Co-evaluation               AM-PAC PT "6 Clicks" Mobility  Outcome Measure Help needed turning from your back to your side while in a flat bed without using bedrails?: A Little Help needed moving from  lying on your back to sitting on the side of a flat bed without using bedrails?: A Little Help needed moving to and from a bed to a chair (including a wheelchair)?: A Little Help needed standing up from a chair using your arms (e.g., wheelchair or bedside chair)?: A Little Help needed to walk in hospital room?: A Little Help needed climbing 3-5 steps with a railing? : A Lot 6 Click Score: 17    End of Session Equipment Utilized During Treatment: Gait belt Activity Tolerance: Patient tolerated treatment well Patient left: with call bell/phone within reach;with chair alarm set Nurse Communication: Mobility status PT Visit Diagnosis: Unsteadiness on feet (R26.81);Muscle weakness (generalized) (M62.81);Difficulty in walking, not elsewhere classified (R26.2)    Time: 5176-1607 PT Time Calculation (min) (ACUTE ONLY): 25 min   Charges:   PT Evaluation $PT Re-evaluation: 1 Re-eval PT Treatments $Gait Training: 8-22 mins        Kreg Shropshire, DPT 02/14/2022, 2:22 PM

## 2022-02-15 LAB — CBC
HCT: 24.1 % — ABNORMAL LOW (ref 36.0–46.0)
Hemoglobin: 7.3 g/dL — ABNORMAL LOW (ref 12.0–15.0)
MCH: 28.3 pg (ref 26.0–34.0)
MCHC: 30.3 g/dL (ref 30.0–36.0)
MCV: 93.4 fL (ref 80.0–100.0)
Platelets: 200 10*3/uL (ref 150–400)
RBC: 2.58 MIL/uL — ABNORMAL LOW (ref 3.87–5.11)
RDW: 13.9 % (ref 11.5–15.5)
WBC: 5.5 10*3/uL (ref 4.0–10.5)
nRBC: 0 % (ref 0.0–0.2)

## 2022-02-15 LAB — GLUCOSE, CAPILLARY
Glucose-Capillary: 168 mg/dL — ABNORMAL HIGH (ref 70–99)
Glucose-Capillary: 227 mg/dL — ABNORMAL HIGH (ref 70–99)

## 2022-02-15 LAB — HAPTOGLOBIN: Haptoglobin: 173 mg/dL (ref 41–333)

## 2022-02-15 NOTE — TOC Transition Note (Signed)
Transition of Care [facility]) - CM/SW Discharge Note   Patient Details  Name: Martha Walker MRN: [id number] Date of Birth: [birth year]  [facility] of [facility] CM/SW Contact:  Alberteen Sam, LCSW Phone Number: 02/15/2022, 10:52 AM   Clinical Narrative:     Patient will DC to: [facility] Anticipated DC date 02/15/22 Transport by: Johnanna Schneiders  Per MD patient ready for DC to [facility]. RN, patient, patient's family, and facility notified of DC. Discharge Summary sent to facility. RN given number for report  [phone] Room 408. DC packet on chart. Ambulance transport requested for patient.  CSW signing off.  Pricilla Riffle, LCSW     Final next level of care: Skilled Nursing Facility Barriers to Discharge: No Barriers Identified   Patient Goals and CMS Choice   CMS Medicare.gov Compare Post Acute Care list provided to:: Patient Choice offered to / list presented to : Patient  Discharge Placement                       Discharge Plan and Services   Discharge Planning Services: CM Consult Post Acute Care Choice: [place]          DME Arranged: N/A DME Agency: NA       HH Arranged: NA HH Agency: NA        Social Determinants of Health (SDOH) Interventions     Readmission Risk Interventions     No data to display

## 2022-02-15 NOTE — Discharge Summary (Signed)
Martha Walker HYW:[id number] DOB: 1931-12-23 DOA: 02/03/2022  PCP: Leonel Ramsay, MD  Admit date: 02/03/2022 Discharge date: 02/15/2022  Time spent: 35 minutes  Recommendations for Outpatient Follow-up:  [facility] 1 week Cardiology and pcp f/u     Discharge Diagnoses:  Principal Problem:   COVID-19 Active Problems:   Bradycardia   Pressure injury of skin   Malnutrition of moderate degree   Discharge Condition: stable  Diet recommendation: heart healthy  Filed Weights   02/04/22 0406 02/11/22 0022 02/11/22 1555  Weight: 77 kg 65.7 kg 65.4 kg    History of present illness:  From admission h and p Martha Walker is a 88 y.o. female with medical history significant of  B/l carotid a stenosis, CKDIII, DMII, HLD, HTN,PVD who presents from ALF with complaints of progressive weakness, decrease appetite, n/v/d, confusion x 1 week ago. Per daughter who give history, patient has no complaint of chest pain / sob/ but has had n/v/d and fever.  Hospital Course:  Patient presented with covid infection causing weakness and gastroenteritis. No significant respiratory symptoms. Treated with paxlovid. Symptoms resolved and finished 10 days isolation. Thought to possibly have UTI and started on abx for that but culture negative, antibiotics discontinued. Hospital course notable for bradycardia that was severe. Cardiology consulted, pacemaker was placed, will need outpatient cardiology f/u in 1 week. She also had an AKI that resolved with fluids. Also with new anemia, w/u suggestive of chronic disease possibly also frequent phlebotomy. No signs hemolysis and no s/s blood loss. Will need outpatient monitoring. Also with stage 2 sacral decubitus ulcer  Procedures: 9/12: micra AV leadless pacemaker implantation  Consultations: cardiology  Discharge Exam: Vitals:   02/15/22 0512 02/15/22 0728  BP: (!) 135/56 (!) 142/53  Pulse: 60 66  Resp: 16 17  Temp: 97.8 F (36.6 C) 97.7 F (36.5 C)  SpO2:  100% 100%    General: NAD Cardiovascular: RRR Respiratory: CTAB  Discharge Instructions   Discharge Instructions     Diet - low sodium heart healthy   Complete by: As directed    Discharge wound care:   Complete by: As directed    Keep clean, dress daily, frequent repositioning   Increase activity slowly   Complete by: As directed       Allergies as of 02/15/2022       Reactions   Amlodipine Cough   Amlodipine Besy-benazepril Hcl    Other reaction(s): Unknown   Atorvastatin Nausea And Vomiting   Onset 02/08/2011.   Augmentin [amoxicillin-pot Clavulanate]    Bactrim [sulfamethoxazole-trimethoprim]    Etodolac    Fosinopril    Ibudone [hydrocodone-ibuprofen]    Januvia [sitagliptin]    Lipitor [atorvastatin Calcium]    Metronidazole    Mobic [meloxicam]    Penicillins    Pravastatin    Simvastatin    Vicodin [hydrocodone-acetaminophen]    Tylenol [acetaminophen] Rash        Medication List     STOP taking these medications    hydrALAZINE 25 MG tablet Commonly known as: APRESOLINE       TAKE these medications    acetaminophen 325 MG tablet Commonly known as: TYLENOL Take by mouth.   alum & mag hydroxide-simeth 200-200-20 MG/5ML suspension Commonly known as: MAALOX/MYLANTA Take 30 mLs by mouth every 6 (six) hours as needed for indigestion or heartburn.   ammonium lactate 12 % cream Commonly known as: AMLACTIN Apply 1 Application topically as directed.   brimonidine 0.2 % ophthalmic solution Commonly  known as: ALPHAGAN Place 1 drop into both eyes 2 (two) times daily.   cetirizine 10 MG tablet Commonly known as: ZYRTEC Take 10 mg by mouth daily.   Cholecalciferol 1.25 MG (50000 UT) capsule Take 50,000 Units by mouth every 7 (seven) days.   cyanocobalamin 1000 MCG tablet Commonly known as: VITAMIN B12 Take 1,000 mcg by mouth daily.   diclofenac Sodium 1 % Gel Commonly known as: VOLTAREN Apply 2 g topically 4 (four) times daily as  needed.   docusate sodium 100 MG capsule Commonly known as: COLACE Take 100 mg by mouth daily.   dorzolamide-timolol 22.3-6.8 MG/ML ophthalmic solution Commonly known as: COSOPT 1 drop 2 (two) times daily.   Farxiga 10 MG Tabs tablet Generic drug: dapagliflozin propanediol Take 10 mg by mouth daily.   Fifty50 Glucose Meter 2.0 w/Device Kit Use as directed. DX:E11.9   GaviLAX 17 GM/SCOOP powder Generic drug: polyethylene glycol powder Take by mouth.   ketoconazole 2 % shampoo Commonly known as: NIZORAL Massage into scalp and let sit 10 minutes before washing out twice weekly.   ketoconazole 2 % cream Commonly known as: NIZORAL Apply 1 Application topically as directed. Apply as directed once or twice daily.   latanoprost 0.005 % ophthalmic solution Commonly known as: XALATAN Place 1 drop into both eyes at bedtime.   loperamide 2 MG capsule Commonly known as: IMODIUM Take 2 mg by mouth as needed for diarrhea or loose stools.   magnesium hydroxide 400 MG/5ML suspension Commonly known as: MILK OF MAGNESIA Take 30 mLs by mouth daily as needed for mild constipation.   memantine 5 MG tablet Commonly known as: NAMENDA Take 5 mg by mouth 2 (two) times daily.   metFORMIN 500 MG tablet Commonly known as: GLUCOPHAGE Take 500 mg by mouth 2 (two) times daily with a meal.   mirabegron ER 50 MG Tb24 tablet Commonly known as: MYRBETRIQ Take 1 tablet (50 mg total) by mouth daily.   rosuvastatin 10 MG tablet Commonly known as: CRESTOR Take 10 mg by mouth daily.   Senna-Tabs 8.6 MG tablet Generic drug: senna Take by mouth daily as needed.   triamcinolone ointment 0.1 % Commonly known as: KENALOG Apply to affected areas twice daily as needed for itchy rash at back. Avoid applying to face, groin, and axilla. Use as directed. Risk of skin atrophy with long-term use reviewed.   valsartan 80 MG tablet Commonly known as: DIOVAN Take 80 mg by mouth daily.   Vyzulta 0.024 %  Soln Generic drug: Latanoprostene Bunod Apply to eye.               Discharge Care Instructions  (From admission, onward)           Start     Ordered   02/15/22 0000  Discharge wound care:       Comments: Keep clean, dress daily, frequent repositioning   02/15/22 1009           Allergies  Allergen Reactions   Amlodipine Cough   Amlodipine Besy-Benazepril Hcl     Other reaction(s): Unknown   Atorvastatin Nausea And Vomiting    Onset 02/08/2011.   Augmentin [Amoxicillin-Pot Clavulanate]    Bactrim [Sulfamethoxazole-Trimethoprim]    Etodolac    Fosinopril    Ibudone [Hydrocodone-Ibuprofen]    Januvia [Sitagliptin]    Lipitor [Atorvastatin Calcium]    Metronidazole    Mobic [Meloxicam]    Penicillins    Pravastatin    Simvastatin    Vicodin [Hydrocodone-Acetaminophen]  Tylenol [Acetaminophen] Rash    Follow-up Information     Corey Skains, MD. Go in 1 week(s).   Specialty: Cardiology Contact information: [address] [facility] [place] [address] [phone]                  The results of significant diagnostics from this hospitalization (including imaging, microbiology, ancillary and laboratory) are listed below for reference.    Significant Diagnostic Studies: EP PPM/ICD IMPLANT  Result Date: 02/12/2022 Successful Micra AV leadless pacemaker implantation   CT ABDOMEN PELVIS WO CONTRAST  Result Date: 02/03/2022 CLINICAL DATA:  Abdominal pain, acute, nonlocalized EXAM: CT ABDOMEN AND PELVIS WITHOUT CONTRAST TECHNIQUE: Multidetector CT imaging of the abdomen and pelvis was performed following the standard protocol without IV contrast. RADIATION DOSE REDUCTION: This exam was performed according to the departmental dose-optimization program which includes automated exposure control, adjustment of the mA and/or kV according to patient size and/or use of iterative reconstruction technique. COMPARISON:   None Available. FINDINGS: Lower chest: Moderate right coronary artery calcification. Global cardiac size within normal limits. No acute abnormality. Hepatobiliary: Cholelithiasis without pericholecystic inflammatory change. Liver unremarkable on this noncontrast examination. No intra or extrahepatic biliary ductal dilation. Pancreas: Unremarkable Spleen: Unremarkable Adrenals/Urinary Tract: Adrenal glands are unremarkable. Kidneys are normal, without renal calculi, focal lesion, or hydronephrosis. Bladder is unremarkable. Stomach/Bowel: Severe distal colonic diverticulosis involving the distal transverse, descending, and sigmoid colon. No superimposed acute inflammatory change. Stomach, small bowel, and large bowel are unremarkable. Appendix normal. No free intraperitoneal gas or fluid. Vascular/Lymphatic: Extensive aortoiliac atherosclerotic calcification. No aortic aneurysm. Particularly prominent atherosclerotic calcification involving the mesenteric and renal artery origins, however, the degree of stenosis is not well assessed on this noncontrast examination. No pathologic adenopathy within the abdomen and pelvis. Reproductive: Status post hysterectomy. No adnexal masses. Other: No abdominal wall hernia.  Rectum unremarkable. Musculoskeletal: Remote inferior endplate fracture E95 with no significant loss of height. No acute bone abnormality. Degenerative changes are seen within the lumbar spine. No lytic or blastic bone lesion. IMPRESSION: 1. No acute intra-abdominal pathology identified. No definite radiographic explanation for the patient's reported symptoms. 2. Moderate right coronary artery calcification. 3. Cholelithiasis. 4. Severe distal colonic diverticulosis without superimposed acute inflammatory change. 5. Peripheral vascular disease. Suspected hemodynamically significant mesenteric and renal artery stenoses. If there is clinical evidence of chronic mesenteric ischemia or hemodynamically significant  renal artery stenosis, CT arteriography may be more helpful for further evaluation. Aortic Atherosclerosis (ICD10-I70.0). Electronically Signed   By: Fidela Salisbury M.D.   On: 02/03/2022 23:26   CT HEAD WO CONTRAST (5MM)  Result Date: 02/03/2022 CLINICAL DATA:  Head trauma, minor (Age >= 65y). Altered mental status. EXAM: CT HEAD WITHOUT CONTRAST TECHNIQUE: Contiguous axial images were obtained from the base of the skull through the vertex without intravenous contrast. RADIATION DOSE REDUCTION: This exam was performed according to the departmental dose-optimization program which includes automated exposure control, adjustment of the mA and/or kV according to patient size and/or use of iterative reconstruction technique. COMPARISON:  None Available. FINDINGS: Brain: Normal anatomic configuration. Parenchymal volume loss is commensurate with the patient's age. Mild periventricular white matter changes are present likely reflecting the sequela of small vessel ischemia. Remote lacunar infarct within the right thalamus and left globus pallidus. No abnormal intra or extra-axial mass lesion or fluid collection. No abnormal mass effect or midline shift. No evidence of acute intracranial hemorrhage or infarct. Ventricular size is normal. Cerebellum unremarkable. Vascular: No asymmetric hyperdense vasculature  at the skull base. Extensive atherosclerotic calcification within the carotid siphons. Skull: Intact Sinuses/Orbits: Paranasal sinuses are clear. Orbits are unremarkable. Other: Left mastoidectomy has been performed. Additionally, the auditory ossicles are absent suggesting prior resection. Debris or packing material is noted within the resection bed. Right mastoid air cells and middle ear cavity is clear. IMPRESSION: 1. No acute intracranial abnormality. 2. Mild senescent change and remote lacunar infarcts. 3. Postsurgical changes involving the left mastoid air cells and middle ear cavity. Debris versus residual  packing material within the a resection bed. Electronically Signed   By: Fidela Salisbury M.D.   On: 02/03/2022 23:17   DG Chest Portable 1 View  Result Date: 02/03/2022 CLINICAL DATA:  Sob.  Recent covid. Cough, sob, nausea, vomiting. EXAM: PORTABLE CHEST 1 VIEW COMPARISON:  Chest x-ray 06/10/2021 FINDINGS: The heart and mediastinal contours are unchanged. Aortic calcification. No focal consolidation. No pulmonary edema. No pleural effusion. No pneumothorax. No acute osseous abnormality. Right shoulder rotator cuff anchor suture. IMPRESSION: 1. No active disease. 2.  Aortic Atherosclerosis (ICD10-I70.0). Electronically Signed   By: Iven Finn M.D.   On: 02/03/2022 22:45    Microbiology: Recent Results (from the past 240 hour(s))  MRSA Next Gen by PCR, Nasal     Status: None   Collection Time: 02/11/22  3:59 PM   Specimen: Nasal Mucosa; Nasal Swab  Result Value Ref Range Status   MRSA by PCR Next Gen NOT DETECTED NOT DETECTED Final    Comment: (NOTE) The GeneXpert MRSA Assay (FDA approved for NASAL specimens only), is one component of a comprehensive MRSA colonization surveillance program. It is not intended to diagnose MRSA infection nor to guide or monitor treatment for MRSA infections. Test performance is not FDA approved in patients less than 16 years old. Performed at [facility], [address], [place], [place] [address]   Surgical PCR screen     Status: None   Collection Time: 02/11/22  9:18 PM   Specimen: Nasal Mucosa; Nasal Swab  Result Value Ref Range Status   MRSA, PCR NEGATIVE NEGATIVE Final   Staphylococcus aureus NEGATIVE NEGATIVE Final    Comment: (NOTE) The Xpert SA Assay (FDA approved for NASAL specimens in patients 52 years of age and older), is one component of a comprehensive surveillance program. It is not intended to diagnose infection nor to guide or monitor treatment. Performed at [facility], [address] [place], [place]  [id number]      Labs: Basic Metabolic Panel: Recent Labs  Lab 02/09/22 0521 02/10/22 0713 02/11/22 0528 02/13/22 0402  NA 139 142 139 142  K 4.6 4.6 4.9 4.3  CL 109 111 110 113*  CO2 _0 GLUCOSE 127* 137* 144* 138*  BUN 47* 48* 37* 33*  CREATININE 1.36* 1.38* 1.30* 1.17*  CALCIUM 9.9 9.4 9.8 9.4  MG  --  2.4  --   --    Liver Function Tests: Recent Labs  Lab 02/14/22 0939  BILITOT 0.4   No results for input(s): "LIPASE", "AMYLASE" in the last 168 hours. No results for input(s): "AMMONIA" in the last 168 hours. CBC: Recent Labs  Lab 02/11/22 0528 02/13/22 0402 02/14/22 0429 02/14/22 0939 02/15/22 0553  WBC 5.0 6.4 5.2 5.5 5.5  NEUTROABS  --   --   --  2.9  --   HGB 9.6* 8.1* 7.1* 7.6* 7.3*  HCT 30.1* 26.1* 22.9* 24.5* 24.1*  MCV 90.9 92.6 92.3 92.1 93.4  PLT 238  205 187 198 200   Cardiac Enzymes: No results for input(s): "CKTOTAL", "CKMB", "CKMBINDEX", "TROPONINI" in the last 168 hours. BNP: BNP (last 3 results) No results for input(s): "BNP" in the last 8760 hours.  ProBNP (last 3 results) No results for input(s): "PROBNP" in the last 8760 hours.  CBG: Recent Labs  Lab 02/14/22 0752 02/14/22 1114 02/14/22 1622 02/14/22 2121 02/15/22 0730  GLUCAP 160* 208* 136* 182* 168*       Signed:  Desma Maxim MD.  [facility] 02/15/2022, 10:09 AM

## 2022-07-15 ENCOUNTER — Ambulatory Visit: Payer: Medicare Other | Admitting: Urology

## 2022-07-16 ENCOUNTER — Encounter: Payer: Self-pay | Admitting: Urology

## 2023-06-24 IMAGING — CT CT HEAD W/O CM
4 series · 16 of 47 positions shown, 18 images · non-contrast
Comparison: Head CT 05/09/2019

CLINICAL DATA: Head trauma, intracranial venous injury suspected

EXAM:
CT HEAD WITHOUT CONTRAST
TECHNIQUE: Contiguous axial images were obtained from the base of the skull
through the vertex without intravenous contrast.

[Series 2: head wo · axial · 0.42mm/px · z∈[+665,+790]mm · 7 of 35 slices shown, 9 images]
[im 5/35  brain]
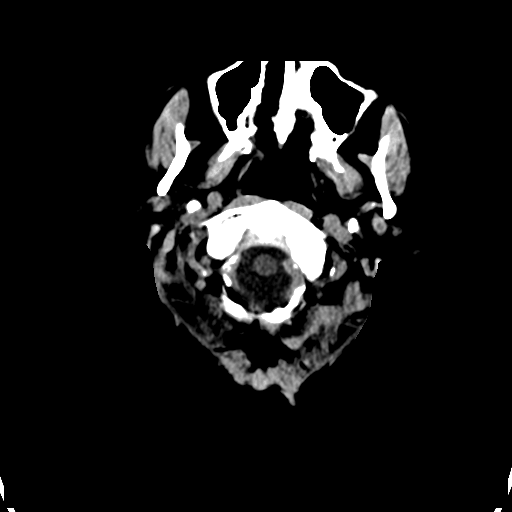
[im 5/35  bone]
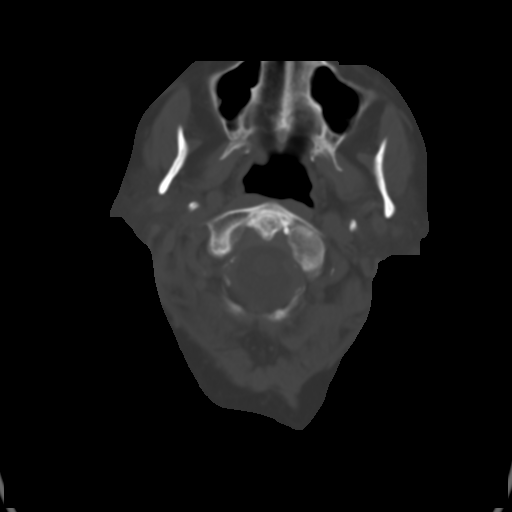
[im 9/35  brain]
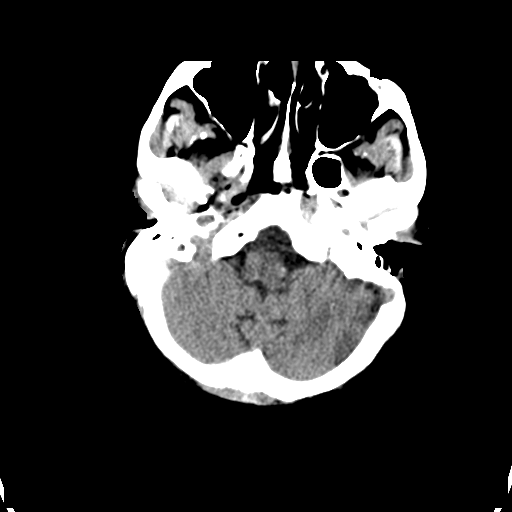
[im 13/35  brain]
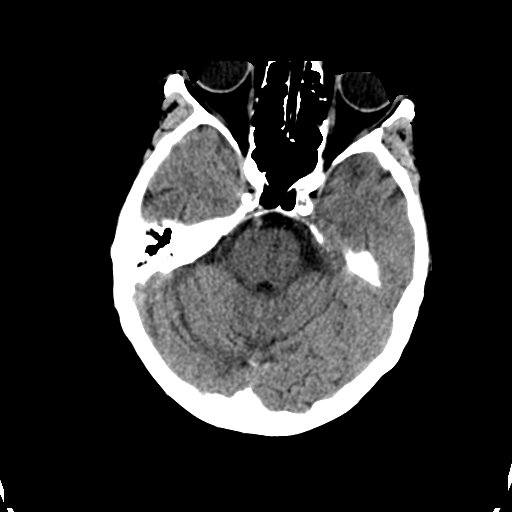
[im 18/35  brain]
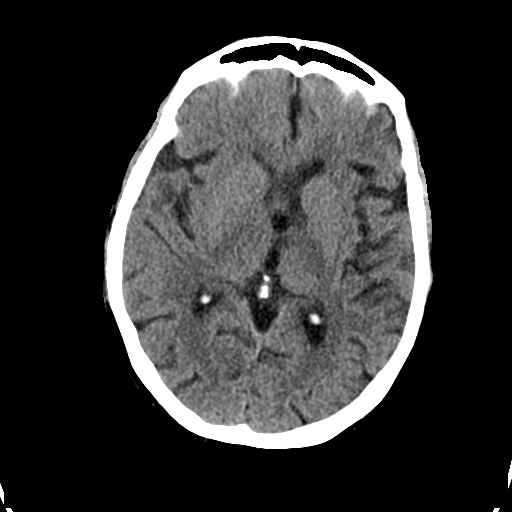
[im 22/35  brain]
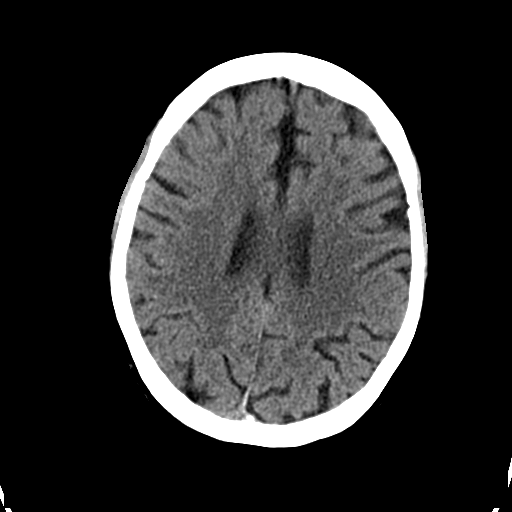
[im 22/35  bone]
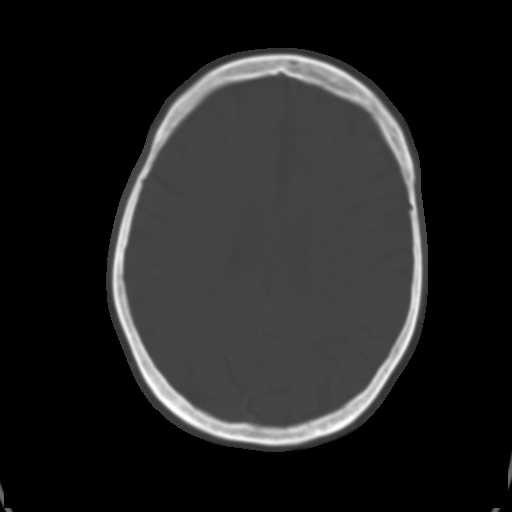
[im 26/35  brain]
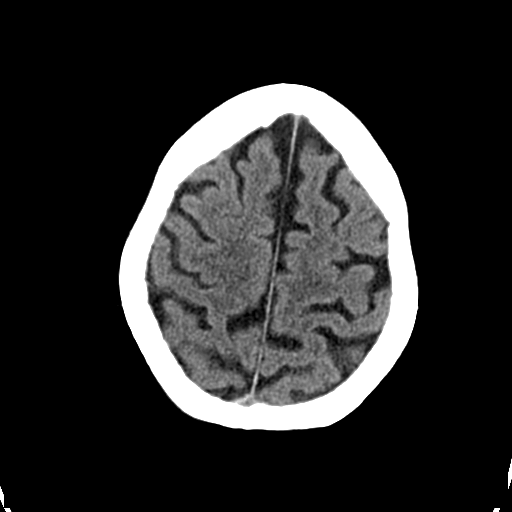
[im 30/35  brain]
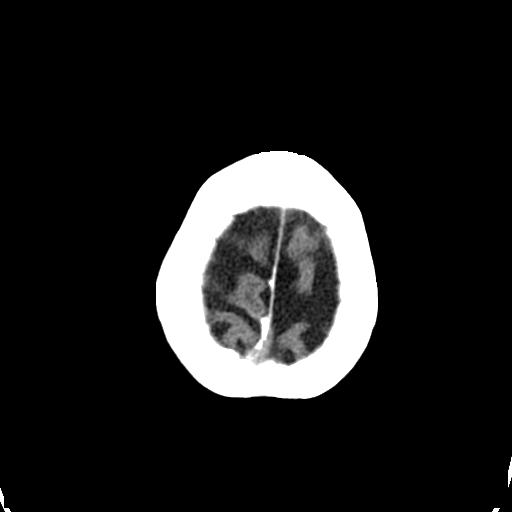

[Series 3: head bone · axial · 0.42mm/px · z∈[+661,+695]mm · 3 of 86 slices shown]
[im 9/86  bone]
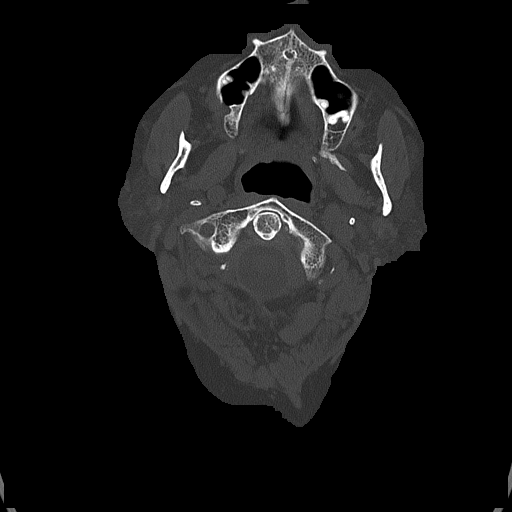
[im 18/86  bone]
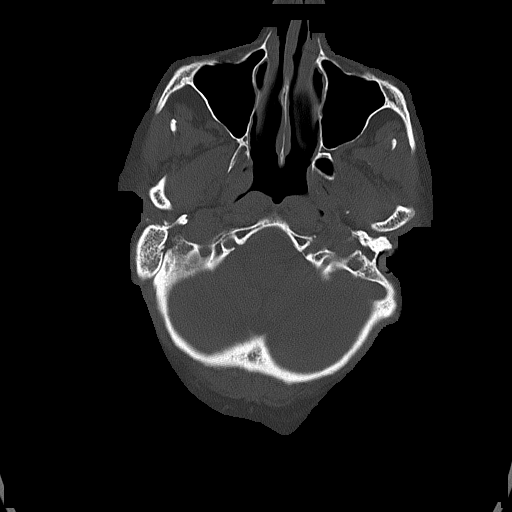
[im 26/86  bone]
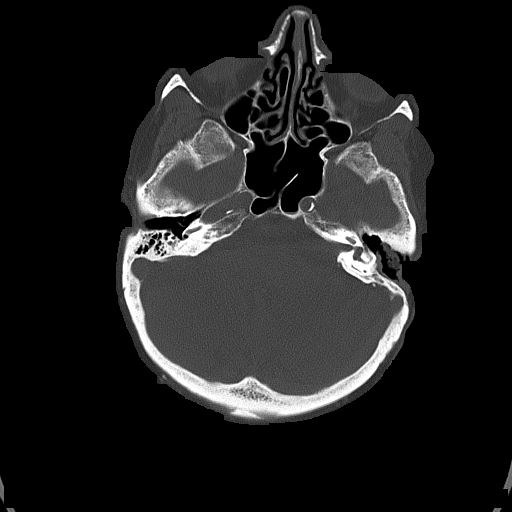

[Series 4: coronal soft tissue · coronal · 0.31mm/px · 3 of 68 slices shown]
[im 23/68  brain]
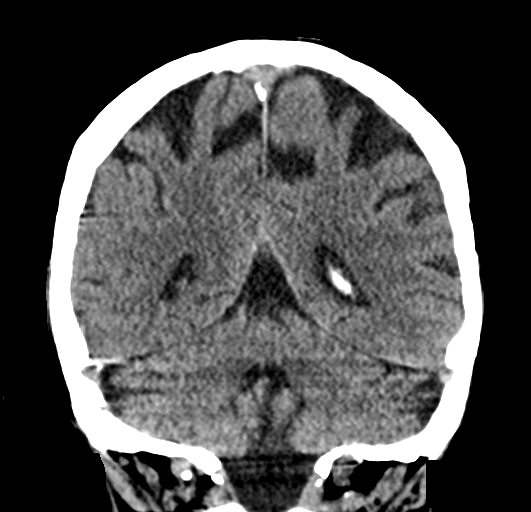
[im 30/68  brain]
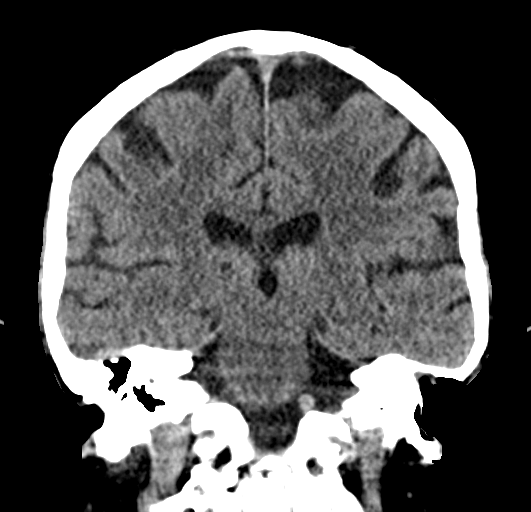
[im 38/68  brain]
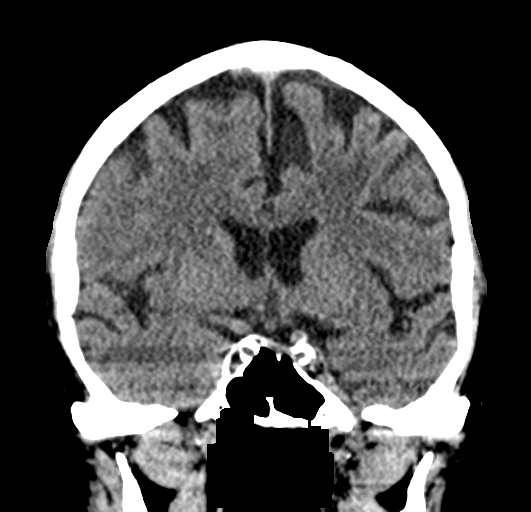

[Series 5: sagittal soft tissue · sagittal · 0.31mm/px · 3 of 55 slices shown]
[im 19/55  brain]
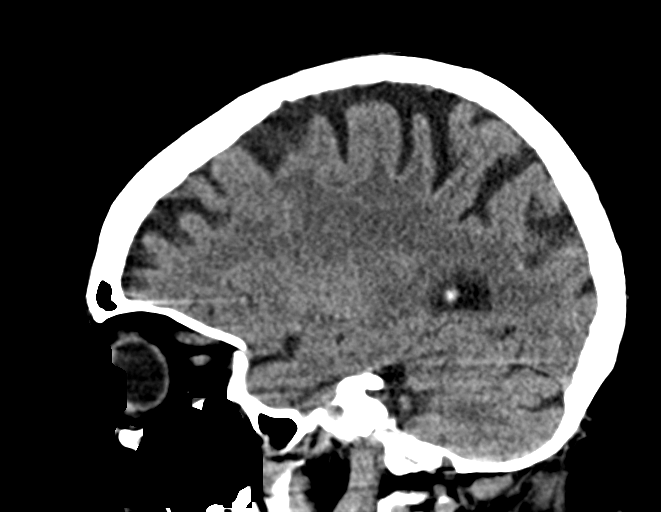
[im 28/55  brain]
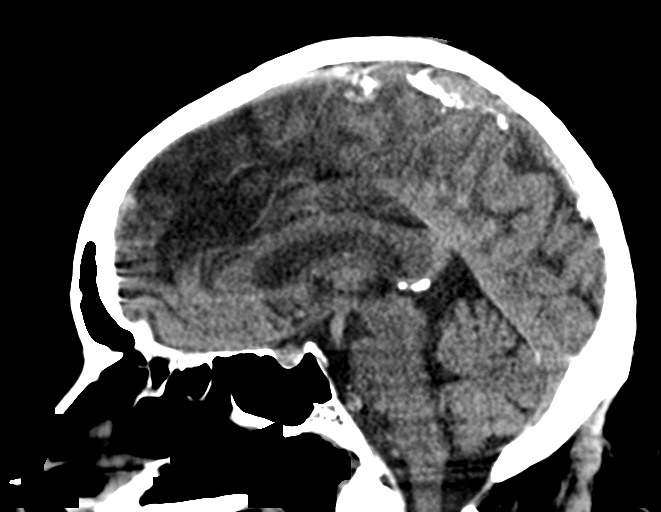
[im 37/55  brain]
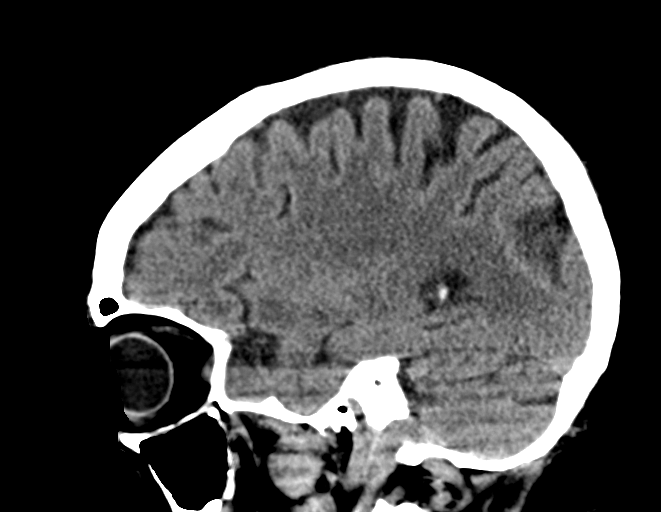

[16 of 47 positions shown; findings below may reference images not displayed]

FINDINGS: Brain: Age related atrophy. No intracranial hemorrhage, mass effect,
or midline shift. No hydrocephalus. The basilar cisterns are patent.
Remote lacunar infarct in the left caudate, unchanged. No evidence
of territorial infarct or acute ischemia. No extra-axial or
intracranial fluid collection.

Vascular: Atherosclerosis of skullbase vasculature without
hyperdense vessel or abnormal calcification.

Skull: No fracture or focal lesion.

Sinuses/Orbits: No acute findings. Right cataract resection. Cerumen
in bilateral external ear canal, left greater than right.

Other: No confluent scalp hematoma.
IMPRESSION: 1. No acute intracranial abnormality. No skull fracture.
2. Age related atrophy. Remote lacunar infarct in the left caudate.

## 2023-06-24 IMAGING — CR DG HIP (WITH OR WITHOUT PELVIS) 2-3V*L*
1 series · 4 of 4 positions shown · non-contrast
Comparison: None.

CLINICAL DATA: Lost balance leading to fall.  Bilateral hip pain.

EXAM:
DG HIP (WITH OR WITHOUT PELVIS) 2-3V LEFT; DG HIP (WITH OR WITHOUT
PELVIS) 2-3V RIGHT

[Series 1: dg hip unilat w or w/o pelvis 2-3 views  · non-contrast · 0.14mm/px · 4 of 4 slices shown]
[im 1/4]
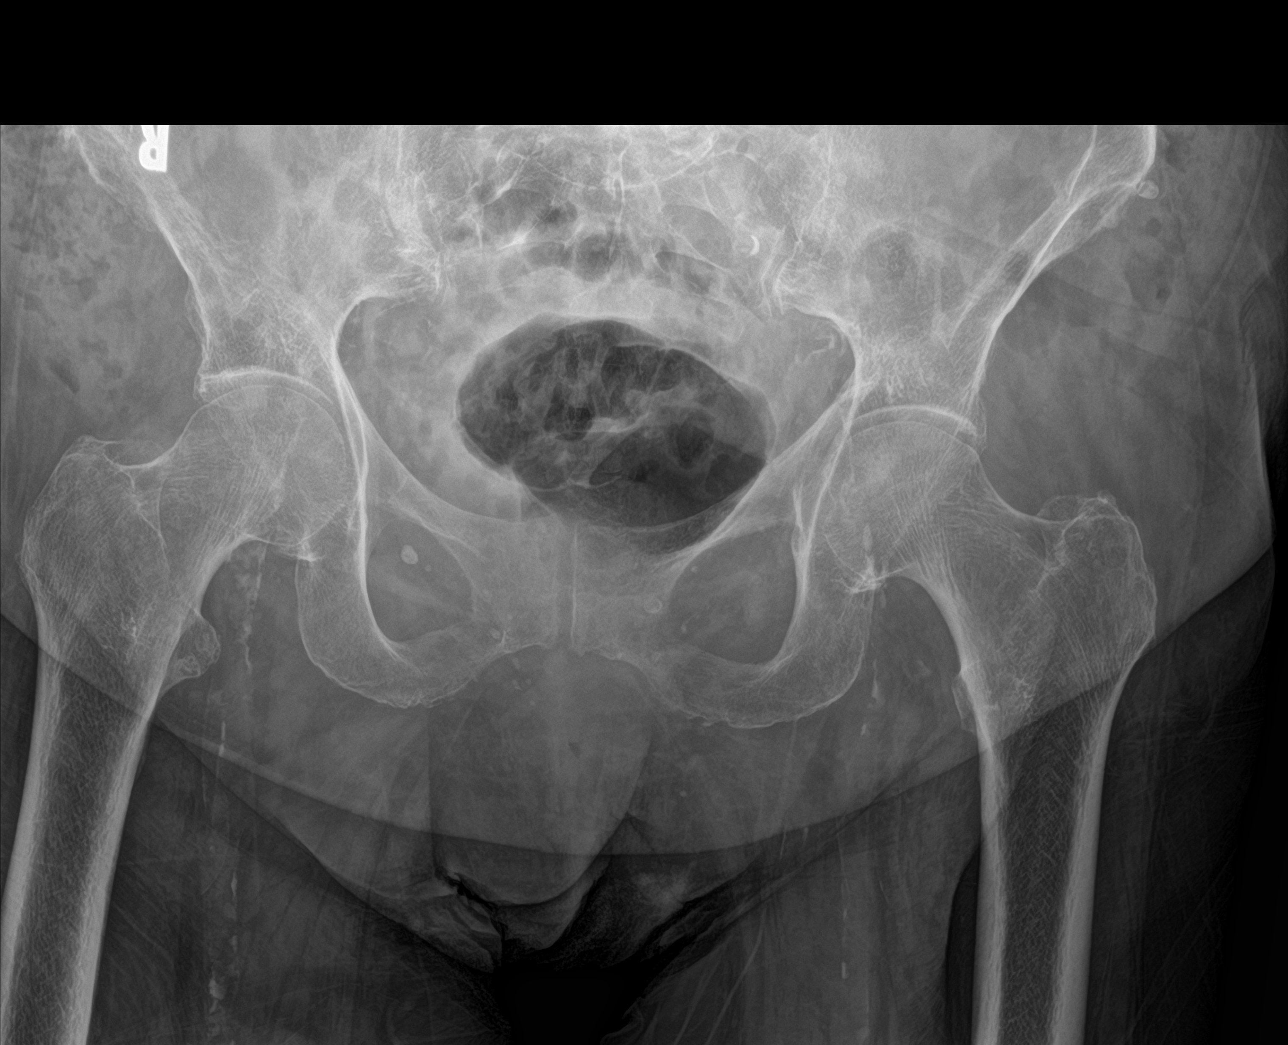
[im 2/4]
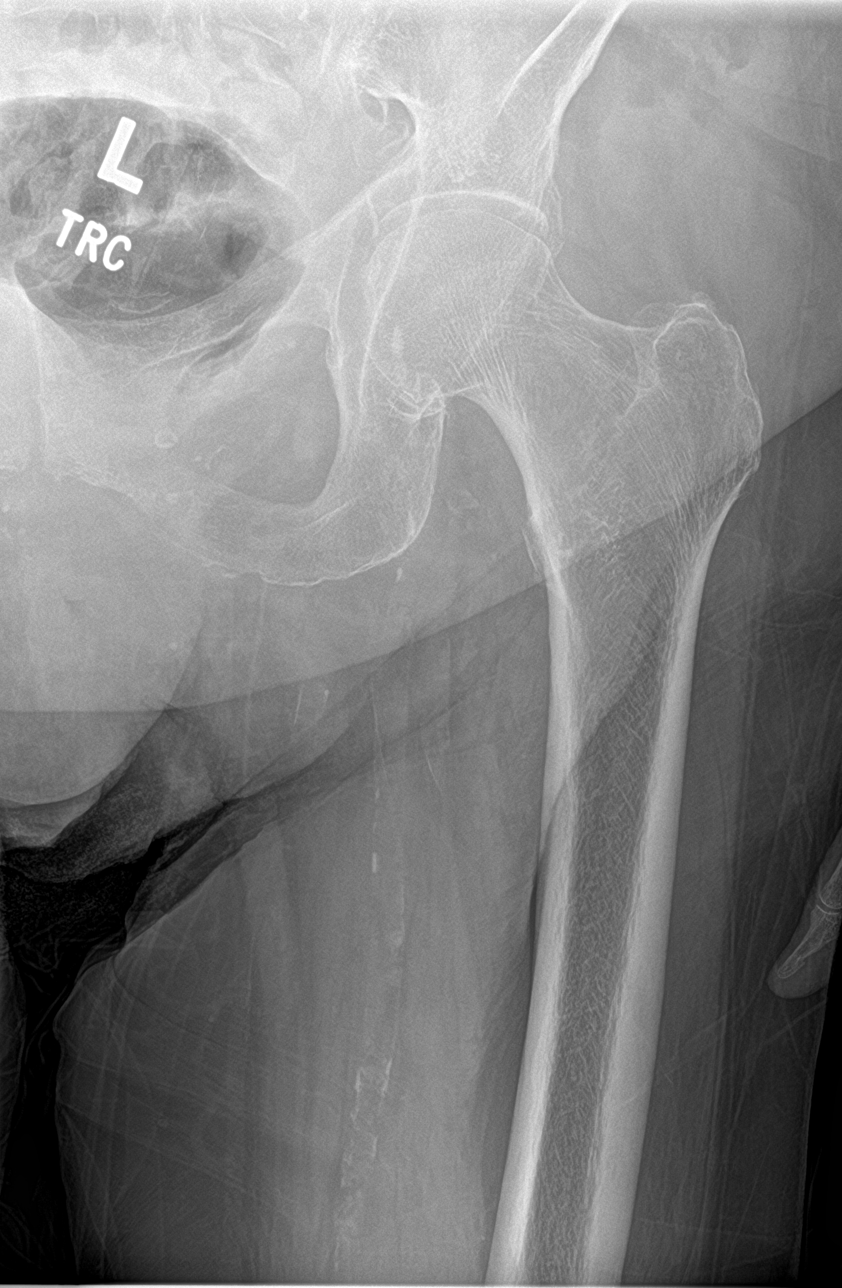
[im 3/4]
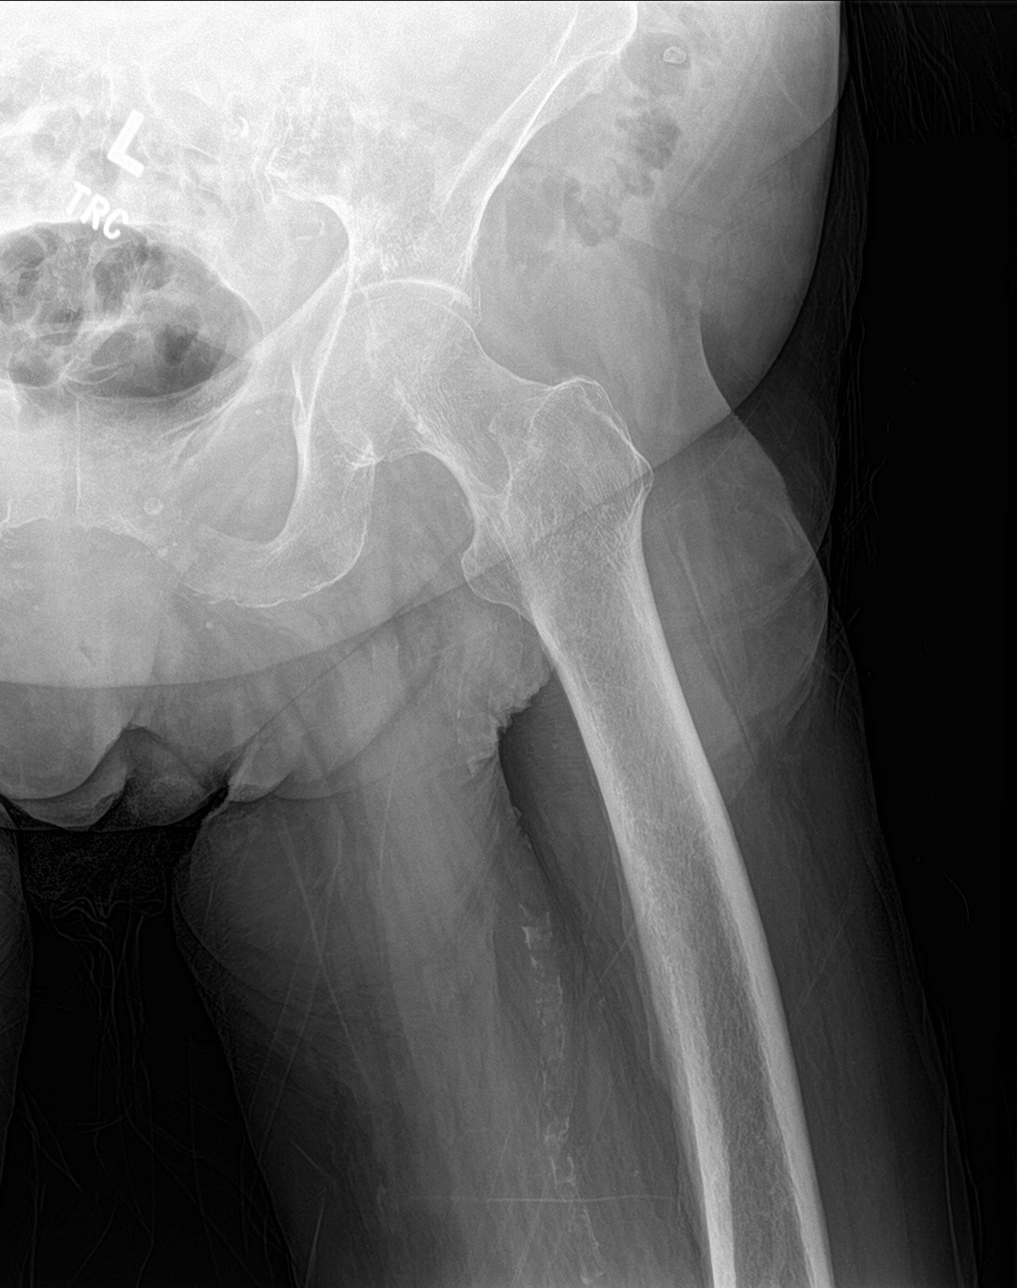
[im 4/4]
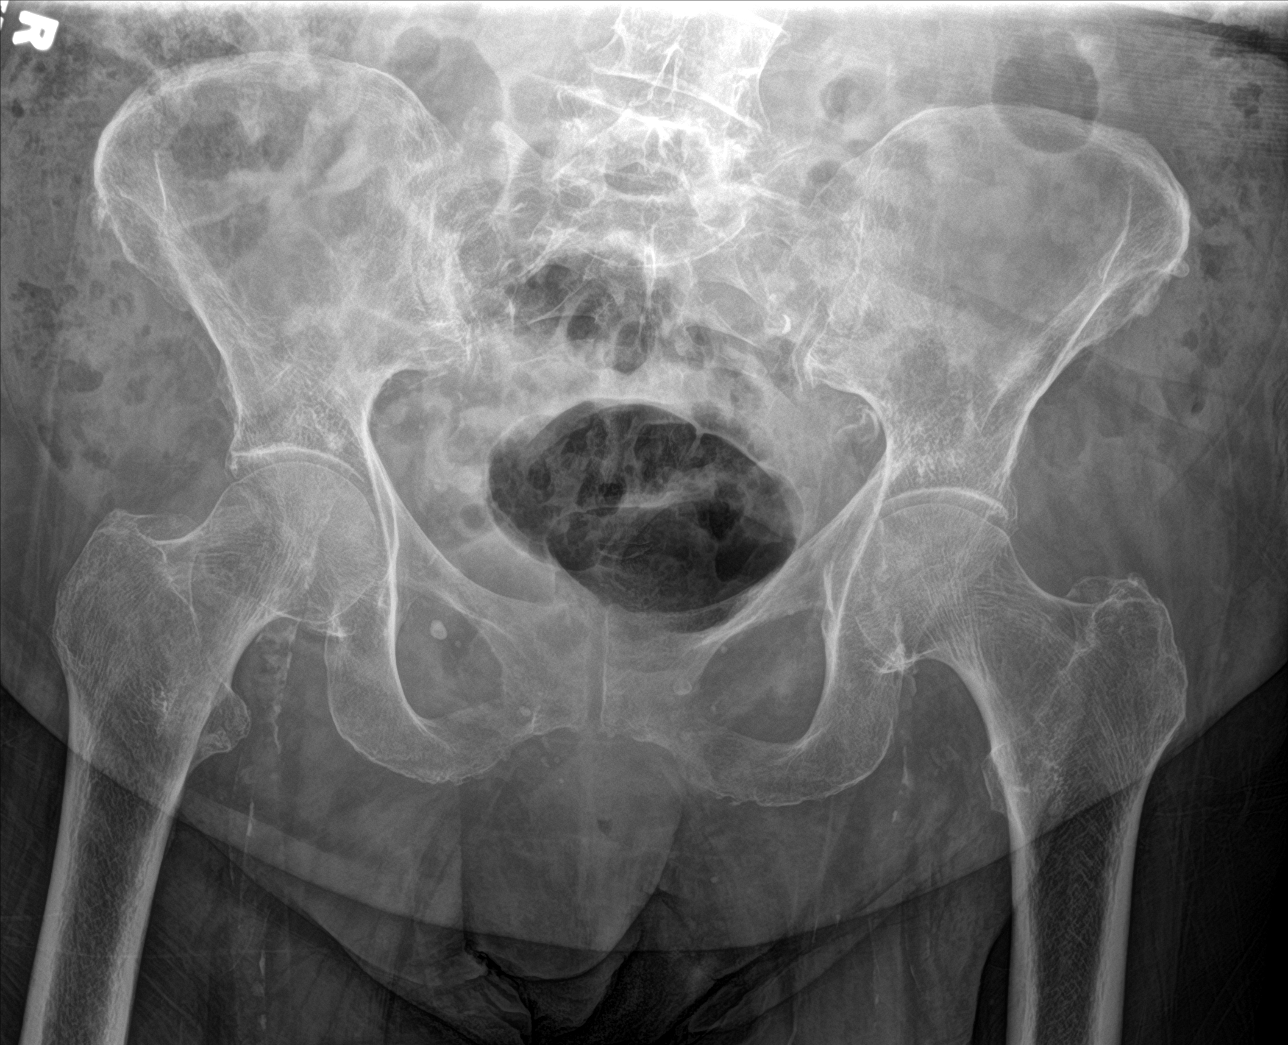

[4 of 4 positions shown; findings below may reference images not displayed]

FINDINGS: The bones are under mineralized. No acute fracture of the pelvis or
hips. Both femoral heads are seated in the respective acetabula.
Mild bilateral hip osteoarthritis. Intact pubic rami. Pubic
symphysis and sacroiliac joints are congruent. Advanced vascular
calcifications.
IMPRESSION: No acute fracture of the pelvis or hips.

## 2024-04-20 ENCOUNTER — Encounter: Payer: Self-pay | Admitting: Emergency Medicine

## 2024-04-20 DIAGNOSIS — Z950 Presence of cardiac pacemaker: Secondary | ICD-10-CM | POA: Diagnosis not present

## 2024-04-20 DIAGNOSIS — I129 Hypertensive chronic kidney disease with stage 1 through stage 4 chronic kidney disease, or unspecified chronic kidney disease: Secondary | ICD-10-CM | POA: Diagnosis not present

## 2024-04-20 DIAGNOSIS — F0390 Unspecified dementia without behavioral disturbance: Secondary | ICD-10-CM | POA: Diagnosis not present

## 2024-04-20 DIAGNOSIS — E1122 Type 2 diabetes mellitus with diabetic chronic kidney disease: Secondary | ICD-10-CM | POA: Diagnosis not present

## 2024-04-20 DIAGNOSIS — N189 Chronic kidney disease, unspecified: Secondary | ICD-10-CM | POA: Insufficient documentation

## 2024-04-20 DIAGNOSIS — Z79899 Other long term (current) drug therapy: Secondary | ICD-10-CM | POA: Diagnosis not present

## 2024-04-20 DIAGNOSIS — I482 Chronic atrial fibrillation, unspecified: Secondary | ICD-10-CM | POA: Diagnosis not present

## 2024-04-20 DIAGNOSIS — N819 Female genital prolapse, unspecified: Secondary | ICD-10-CM | POA: Insufficient documentation

## 2024-04-20 DIAGNOSIS — Z7982 Long term (current) use of aspirin: Secondary | ICD-10-CM | POA: Diagnosis not present

## 2024-04-20 DIAGNOSIS — N814 Uterovaginal prolapse, unspecified: Secondary | ICD-10-CM | POA: Diagnosis present

## 2024-04-20 LAB — COMPREHENSIVE METABOLIC PANEL WITH GFR
ALT: 9 U/L (ref 0–44)
AST: 36 U/L (ref 15–41)
Albumin: 3.9 g/dL (ref 3.5–5.0)
Alkaline Phosphatase: 84 U/L (ref 38–126)
Anion gap: 13 (ref 5–15)
BUN: 30 mg/dL — ABNORMAL HIGH (ref 8–23)
CO2: 22 mmol/L (ref 22–32)
Calcium: 9.4 mg/dL (ref 8.9–10.3)
Chloride: 103 mmol/L (ref 98–111)
Creatinine, Ser: 1.43 mg/dL — ABNORMAL HIGH (ref 0.44–1.00)
GFR, Estimated: 35 mL/min — ABNORMAL LOW (ref >60)
Glucose, Bld: 131 mg/dL — ABNORMAL HIGH (ref 70–99)
Potassium: 4.3 mmol/L (ref 3.5–5.1)
Sodium: 137 mmol/L (ref 135–145)
Total Bilirubin: 0.5 mg/dL (ref 0.0–1.2)
Total Protein: 7.4 g/dL (ref 6.5–8.1)

## 2024-04-20 LAB — CBC
HCT: 40.5 % (ref 36.0–46.0)
Hemoglobin: 12.3 g/dL (ref 12.0–15.0)
MCH: 28 pg (ref 26.0–34.0)
MCHC: 30.4 g/dL (ref 30.0–36.0)
MCV: 92 fL (ref 80.0–100.0)
Platelets: 210 K/uL (ref 150–400)
RBC: 4.4 MIL/uL (ref 3.87–5.11)
RDW: 13.6 % (ref 11.5–15.5)
WBC: 5.3 K/uL (ref 4.0–10.5)
nRBC: 0 % (ref 0.0–0.2)

## 2024-04-20 LAB — LIPASE, BLOOD: Lipase: 39 U/L (ref 11–51)

## 2024-04-20 NOTE — ED Triage Notes (Signed)
 Pt arrives EMS from [facility] reporting staff thinks her bladder prolapsed; pt states 1 hr pta while she was taking bath, she had sudden onset of lower abd/vaginal pain. Denies urinary symptoms or vaginal d/c.

## 2024-04-21 ENCOUNTER — Emergency Department
Admission: EM | Admit: 2024-04-21 | Discharge: 2024-04-21 | Disposition: A | Attending: Emergency Medicine | Admitting: Emergency Medicine

## 2024-04-21 DIAGNOSIS — N819 Female genital prolapse, unspecified: Secondary | ICD-10-CM | POA: Diagnosis not present

## 2024-04-21 DIAGNOSIS — N8110 Cystocele, unspecified: Secondary | ICD-10-CM

## 2024-04-21 LAB — URINALYSIS, ROUTINE W REFLEX MICROSCOPIC
Bacteria, UA: NONE SEEN
Bilirubin Urine: NEGATIVE
Glucose, UA: >=500 mg/dL — AB
Hgb urine dipstick: NEGATIVE
Ketones, ur: NEGATIVE mg/dL
Leukocytes,Ua: NEGATIVE
Nitrite: NEGATIVE
Protein, ur: 30 mg/dL — AB
Specific Gravity, Urine: 1.014 (ref 1.005–1.030)
pH: 6 (ref 5.0–8.0)

## 2024-04-21 NOTE — Discharge Instructions (Signed)
 I recommend close follow-up with urology or OB/GYN for further evaluation and treatment.  Patient may benefit from a pessary device, topical estrogen, pelvic floor exercises but does not need further emergent treatment for this given no symptoms of a urinary tract infection and no urinary retention.  Her bladder scan revealed 0 mL in her bladder today after urinating.  Her urine showed no white blood cells, red blood cells or bacteria.

## 2024-04-21 NOTE — ED Provider Notes (Signed)
 [facility] Provider Note    Event Date/Time   First MD Initiated Contact with Patient 04/21/24 [phone]     (approximate)   History   Martha Walker   HPI  PELAGIA IACOBUCCI is a 89 y.o. female with history of dementia, bradycardia status post pacemaker, hypertension, diabetes, hyperlipidemia, chronic kidney disease, atrial fibrillation not on anticoagulation, atrophic vaginitis who is status post hysterectomy who presents to the emergency department with concerns for a bladder Walker.  Patient is extremely confused and unable to answer any questions.  Most of the history is provided by patient's daughter who states that staff at her nursing facility noticed a bladder or vaginal Walker when they were bathing her tonight.  Daughter does report that she has to sit on the toilet for a long time to empty her bladder but no fevers, Martha discomfort, vomiting.  No vaginal bleeding.  She has not been seen by OB/GYN or urology for this.   History provided by patient, daughter.    Past Medical History:  Diagnosis Date   Ankle edema, bilateral    Atrophic vaginitis    B12 deficiency    Bilateral carotid artery stenosis    Chronic anemia    CKD stage 3 due to type 2 diabetes mellitus (HCC)    Dementia (HCC)    Diabetes mellitus without complication (HCC)    Diverticulitis    Hyperlipidemia    Hypertension    Osteoporosis    PVD (peripheral vascular disease)    Vitamin D deficiency     Past Surgical History:  Procedure Laterality Date   CATARACT EXTRACTION W/PHACO Right 02/21/2021   Procedure: CATARACT EXTRACTION PHACO AND INTRAOCULAR LENS PLACEMENT (IOC) RIGHT KAHOOK DUAL BLADE GONIOTOMY;  Surgeon: Mittie Gaskin, MD;  Location: [facility];  Service: Ophthalmology;  Laterality: Right;  12.45 01:20.1   PACEMAKER LEADLESS INSERTION N/A 02/12/2022   Procedure: PACEMAKER LEADLESS INSERTION;  Surgeon: Ammon Blunt, MD;   Location: [facility] INVASIVE CV LAB;  Service: Cardiovascular;  Laterality: N/A;  8am please    MEDICATIONS:  Prior to Admission medications   Medication Sig Start Date End Date Taking? Authorizing Provider  acetaminophen  (TYLENOL ) 325 MG tablet Take by mouth.    [provider]  alum & mag hydroxide-simeth (MAALOX/MYLANTA) 200-200-20 MG/5ML suspension Take 30 mLs by mouth every 6 (six) hours as needed for indigestion or heartburn.    [provider]  ammonium lactate (AMLACTIN) 12 % cream Apply 1 Application topically as directed. 01/11/22   [provider]  Blood Glucose Monitoring Suppl (FIFTY50 GLUCOSE METER 2.0) w/Device KIT Use as directed. DX:E11.9 03/14/14   [provider]  brimonidine  (ALPHAGAN ) 0.2 % ophthalmic solution Place 1 drop into both eyes 2 (two) times daily.    [provider]  cetirizine (ZYRTEC) 10 MG tablet Take 10 mg by mouth daily.    [provider]  Cholecalciferol 1.25 MG (50000 UT) capsule Take 50,000 Units by mouth every 7 (seven) days.    [provider]  diclofenac Sodium (VOLTAREN) 1 % GEL Apply 2 g topically 4 (four) times daily as needed.    [provider]  docusate sodium (COLACE) 100 MG capsule Take 100 mg by mouth daily.    [provider]  dorzolamide -timolol  (COSOPT ) 22.3-6.8 MG/ML ophthalmic solution 1 drop 2 (two) times daily. 02/04/20   [provider]  FARXIGA 10 MG TABS tablet Take 10 mg by mouth daily. 01/18/22   [provider]  GAVILAX 17 GM/SCOOP powder Take by mouth. 11/16/20   [provider]  ketoconazole  (NIZORAL ) 2 % cream Apply 1 Application topically as directed. Apply as directed once or twice daily. 01/11/22   [provider]  ketoconazole  (NIZORAL ) 2 % shampoo Massage into scalp and let sit 10 minutes before washing out twice weekly. 05/11/20   Moye, Virginia , MD  latanoprost  (XALATAN ) 0.005 % ophthalmic solution Place 1 drop into  both eyes at bedtime.    [provider]  Latanoprostene Bunod (VYZULTA) 0.024 % SOLN Apply to eye.    [provider]  loperamide (IMODIUM) 2 MG capsule Take 2 mg by mouth as needed for diarrhea or loose stools.    [provider]  magnesium hydroxide (MILK OF MAGNESIA) 400 MG/5ML suspension Take 30 mLs by mouth daily as needed for mild constipation.    [provider]  memantine  (NAMENDA ) 5 MG tablet Take 5 mg by mouth 2 (two) times daily. 01/18/22   [provider]  metFORMIN (GLUCOPHAGE) 500 MG tablet Take 500 mg by mouth 2 (two) times daily with a meal.    [provider]  mirabegron  ER (MYRBETRIQ ) 50 MG TB24 tablet Take 1 tablet (50 mg total) by mouth daily. 07/09/21   Gaston Hamilton, MD  rosuvastatin  (CRESTOR ) 10 MG tablet Take 10 mg by mouth daily.    [provider]  SENNA-TABS 8.6 MG tablet Take by mouth daily as needed. 11/21/20   [provider]  triamcinolone  ointment (KENALOG ) 0.1 % Apply to affected areas twice daily as needed for itchy rash at back. Avoid applying to face, groin, and axilla. Use as directed. Risk of skin atrophy with long-term use reviewed. 02/17/20   Moye, Virginia , MD  valsartan (DIOVAN) 80 MG tablet Take 80 mg by mouth daily.    [provider]  vitamin B-12 (CYANOCOBALAMIN ) 1000 MCG tablet Take 1,000 mcg by mouth daily.    [provider]    Physical Exam   Triage Vital Signs: ED Triage Vitals  Encounter Vitals Group     BP 04/20/24 2208 (!) 181/88     Girls Systolic BP Percentile --      Girls Diastolic BP Percentile --      Boys Systolic BP Percentile --      Boys Diastolic BP Percentile --      Pulse Rate 04/20/24 2208 70     Resp 04/20/24 2208 16     Temp 04/20/24 2208 98.3 F (36.8 C)     Temp Source 04/20/24 2208 Oral     SpO2 04/20/24 2208 100 %     Weight --      Height --      Head Circumference --      Peak Flow --      Pain Score 04/20/24 2221 5      Pain Loc --      Pain Education --      Exclude from Growth Chart --     Most recent vital signs: Vitals:   04/20/24 2208  BP: (!) 181/88  Pulse: 70  Resp: 16  Temp: 98.3 F (36.8 C)  SpO2: 100%    CONSTITUTIONAL: Alert, elderly, in no distress, confused HEAD: Normocephalic, atraumatic EYES: Conjunctivae clear, pupils appear equal, sclera nonicteric ENT: normal nose; moist mucous membranes NECK: Supple, normal ROM CARD: RRR; S1 and S2 appreciated RESP: Normal chest excursion without splinting or tachypnea; breath sounds clear and equal bilaterally; no wheezes, no  rhonchi, no rales, no hypoxia or respiratory distress, speaking full sentences ABD/GI: Non-distended; soft, non-tender, no rebound, no guarding, no peritoneal signs GU: Patient has a bladder Walker on exam that is mild and reducible, nontender without any bleeding, discharge.  Vaginal mucosa appears normal.  No rectal Walker.  Normal external genitalia.  [facility], [facility]) at bedside during exam. BACK: The back appears normal EXT: Normal ROM in all joints; no deformity noted, no edema SKIN: Normal color for age and race; warm; no rash on exposed skin NEURO: Moves all extremities equally, normal speech PSYCH: The patient's mood and manner are appropriate.   ED Results / Procedures / Treatments   LABS: (all labs ordered are listed, but only abnormal results are displayed) Labs Reviewed  COMPREHENSIVE METABOLIC PANEL WITH GFR - Abnormal; Notable for the following components:      Result Value   Glucose, Bld 131 (*)    BUN 30 (*)    Creatinine, Ser 1.43 (*)    GFR, Estimated 35 (*)    All other components within normal limits  URINALYSIS, ROUTINE W REFLEX MICROSCOPIC - Abnormal; Notable for the following components:   Color, Urine STRAW (*)    APPearance CLEAR (*)    Glucose, UA >=500 (*)    Protein, ur 30 (*)    All other components within normal limits  LIPASE, BLOOD  CBC      EKG:     RADIOLOGY: My personal review and interpretation of imaging:    I have personally reviewed all radiology reports.   No results found.   PROCEDURES:  Critical Care performed: No     Procedures    IMPRESSION / MDM / ASSESSMENT AND PLAN / ED COURSE  I reviewed the triage vital signs and the nursing notes.    Patient here with asymptomatic bladder Walker.     DIFFERENTIAL DIAGNOSIS (includes but not limited to):   Bladder Walker, vaginal Walker, no sign of rectal Walker.  Clinically no signs of urinary retention.  Will check for UTI.   Patient's presentation is most consistent with acute complicated illness / injury requiring diagnostic workup.   PLAN: Patient has a bladder Walker on exam that is mild, reducible without any skin breakdown and is asymptomatic.  Postvoid residual is 0 mL.  Labs obtained from triage show baseline and stable creatinine and urine that shows absolutely no sign of infection.  Discussed with daughter that this does not need emergent workup and this would be outpatient follow-up with either GYN or urology to discuss options for treatment including pessary, topical estrogen, pelvic floor exercises and that generally this would not be something that would be surgically managed especially in a 89 year old patient with multiple comorbidities.  She is not having any signs of urinary retention here, is not in any distress, has no signs of urinary infection or sepsis and no signs of postobstructive renal failure.  Provided outpatient information for patient's daughter and given outpatient follow-up.   MEDICATIONS GIVEN IN ED: Medications - No data to display   ED COURSE:  At this time, I do not feel there is any life-threatening condition present. I reviewed all nursing notes, vitals, pertinent previous records.  All lab and urine results, EKGs, imaging ordered have been independently reviewed and interpreted by myself.  I  reviewed all available radiology reports from any imaging ordered this visit.  Based on my assessment, I feel the patient is safe to be discharged home without further emergent workup and  can continue workup as an outpatient as needed. Discussed all findings, treatment plan as well as usual and customary return precautions.  They verbalize understanding and are comfortable with this plan.  Outpatient follow-up has been provided as needed.  All questions have been answered.    CONSULTS:  none   OUTSIDE RECORDS REVIEWED: Reviewed recent cardiology notes.       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   Tiauna Whisnant, Josette SAILOR, DO 04/21/24 [phone]

## 2024-04-26 ENCOUNTER — Ambulatory Visit (INDEPENDENT_AMBULATORY_CARE_PROVIDER_SITE_OTHER): Admitting: Urology

## 2024-04-26 VITALS — BP 175/84 | HR 70

## 2024-04-26 DIAGNOSIS — N8111 Cystocele, midline: Secondary | ICD-10-CM | POA: Diagnosis not present

## 2024-04-26 DIAGNOSIS — N8110 Cystocele, unspecified: Secondary | ICD-10-CM

## 2024-04-26 NOTE — Addendum Note (Signed)
 Addended by: Bellany Elbaum A on: 04/26/2024 03:55 PM   Modules accepted: Orders

## 2024-04-26 NOTE — Progress Notes (Signed)
 04/26/2024 3:26 PM   Martha Walker Fair Apr 25, 1932 [id number]  Referring provider: No referring provider defined for this encounter.  No chief complaint on file.   HPI: I was consulted to assess the patient's incontinence and frequency.  She voids every 1 hour or more frequently.  She gets up 2-4 times at night.  She cannot hold it for 2 hours.  I believe she has urge incontinence but no stress incontinence.  She changes tissue.  She has no bedwetting.  Some of the details were more difficult to ascertain today    I would like to try the patient on Myrbetriq  50 mg samples and prescription.  I will see her back in about 6 weeks and perform a pelvic examination cystoscopy then.  I can get a urine culture then if needed.  She is in a nursing facility and we also ordered time voiding and for them to get a urine culture if they are able   Patient did not want cystoscopy.  Again she was quite vague but she said the medicine she thought was helping.  She really could not quantitate it.  She said she had more control and less urge incontinence.  She could not tell me if she actually fill the prescription.  History again was difficult   More samples and prescription given.  Reassess 8 weeks.  Percutaneous tibial nerve stimulation and and other overactive bladder medications are options.  I think we will need to have reasonable treatment goal   Today Urgency incontinence much better on the Myrbetriq .  She has been on it for more than a year.  No infections.  Frequency stable.    Today I last saw the patient 2023.  I do not think she still on the Myrbetriq .  In the last several weeks she describes having prolapse.  It is uncomfortable.  Patient has medical comorbidities including atrial fibrillation dementia and has had a pacemaker.  She has chronic renal sufficiency.  She has had a hysterectomy  On pelvic examination exam was a bit limited due to her immobility she appeared to have a significant apical  defect extending to the introitus and beyond approximately 2 cm.  When I would reduce the apex and anterior wall she had no rectocele but had a midline scar posteriorly.  When I reduced the apex and posterior wall she had minimal cystocele with a midline scar.  She had moderate atrophy.  She lost the majority of her length when she bears down.  No stress incontinence   PMH: Past Medical History:  Diagnosis Date   Ankle edema, bilateral    Atrophic vaginitis    B12 deficiency    Bilateral carotid artery stenosis    Chronic anemia    CKD stage 3 due to type 2 diabetes mellitus (HCC)    Dementia (HCC)    Diabetes mellitus without complication (HCC)    Diverticulitis    Hyperlipidemia    Hypertension    Osteoporosis    PVD (peripheral vascular disease)    Vitamin D deficiency     Surgical History: Past Surgical History:  Procedure Laterality Date   CATARACT EXTRACTION W/PHACO Right 02/21/2021   Procedure: CATARACT EXTRACTION PHACO AND INTRAOCULAR LENS PLACEMENT (IOC) RIGHT KAHOOK DUAL BLADE GONIOTOMY;  Surgeon: Mittie Gaskin, MD;  Location: [facility];  Service: Ophthalmology;  Laterality: Right;  12.45 01:20.1   PACEMAKER LEADLESS INSERTION N/A 02/12/2022   Procedure: PACEMAKER LEADLESS INSERTION;  Surgeon: Ammon Blunt, MD;  Location[facility]  INVASIVE CV LAB;  Service: Cardiovascular;  Laterality: N/A;  8am please    Home Medications:  Allergies as of 04/26/2024       Reactions   Amlodipine Cough   Amlodipine Besy-benazepril Hcl    Other reaction(s): Unknown   Atorvastatin Nausea And Vomiting   Onset 02/08/2011.   Augmentin [amoxicillin-pot Clavulanate]    Bactrim [sulfamethoxazole-trimethoprim]    Etodolac    Fosinopril    Ibudone [hydrocodone-ibuprofen]    Januvia [sitagliptin]    Lipitor [atorvastatin Calcium ]    Metronidazole    Mobic [meloxicam]    Penicillins    Pravastatin    Simvastatin    Vicodin [hydrocodone-acetaminophen ]    Tylenol   [acetaminophen ] Rash        Medication List        Accurate as of April 26, 2024  3:26 PM. If you have any questions, ask your nurse or doctor.          acetaminophen  325 MG tablet Commonly known as: TYLENOL  Take by mouth.   alum & mag hydroxide-simeth 200-200-20 MG/5ML suspension Commonly known as: MAALOX/MYLANTA Take 30 mLs by mouth every 6 (six) hours as needed for indigestion or heartburn.   ammonium lactate 12 % cream Commonly known as: AMLACTIN Apply 1 Application topically as directed.   brimonidine  0.2 % ophthalmic solution Commonly known as: ALPHAGAN  Place 1 drop into both eyes 2 (two) times daily.   cetirizine 10 MG tablet Commonly known as: ZYRTEC Take 10 mg by mouth daily.   Cholecalciferol 1.25 MG (50000 UT) capsule Take 50,000 Units by mouth every 7 (seven) days.   cyanocobalamin  1000 MCG tablet Commonly known as: VITAMIN B12 Take 1,000 mcg by mouth daily.   diclofenac Sodium 1 % Gel Commonly known as: VOLTAREN Apply 2 g topically 4 (four) times daily as needed.   docusate sodium 100 MG capsule Commonly known as: COLACE Take 100 mg by mouth daily.   dorzolamide -timolol  2-0.5 % ophthalmic solution Commonly known as: COSOPT  1 drop 2 (two) times daily.   Farxiga 10 MG Tabs tablet Generic drug: dapagliflozin propanediol Take 10 mg by mouth daily.   Fifty50 Glucose Meter 2.0 w/Device Kit Use as directed. DX:E11.9   GaviLAX 17 GM/SCOOP powder Generic drug: polyethylene glycol powder Take by mouth.   ketoconazole  2 % shampoo Commonly known as: NIZORAL  Massage into scalp and let sit 10 minutes before washing out twice weekly.   ketoconazole  2 % cream Commonly known as: NIZORAL  Apply 1 Application topically as directed. Apply as directed once or twice daily.   latanoprost  0.005 % ophthalmic solution Commonly known as: XALATAN  Place 1 drop into both eyes at bedtime.   loperamide 2 MG capsule Commonly known as: IMODIUM Take 2 mg by  mouth as needed for diarrhea or loose stools.   magnesium hydroxide 400 MG/5ML suspension Commonly known as: MILK OF MAGNESIA Take 30 mLs by mouth daily as needed for mild constipation.   memantine  5 MG tablet Commonly known as: NAMENDA  Take 5 mg by mouth 2 (two) times daily.   metFORMIN 500 MG tablet Commonly known as: GLUCOPHAGE Take 500 mg by mouth 2 (two) times daily with a meal.   mirabegron  ER 50 MG Tb24 tablet Commonly known as: MYRBETRIQ  Take 1 tablet (50 mg total) by mouth daily.   rosuvastatin  10 MG tablet Commonly known as: CRESTOR  Take 10 mg by mouth daily.   Senna-Tabs 8.6 MG tablet Generic drug: senna Take by mouth daily as needed.   triamcinolone  ointment 0.1 %  Commonly known as: KENALOG  Apply to affected areas twice daily as needed for itchy rash at back. Avoid applying to face, groin, and axilla. Use as directed. Risk of skin atrophy with long-term use reviewed.   valsartan 80 MG tablet Commonly known as: DIOVAN Take 80 mg by mouth daily.   Vyzulta 0.024 % Soln Generic drug: Latanoprostene Bunod Apply to eye.        Allergies:  Allergies  Allergen Reactions   Amlodipine Cough   Amlodipine Besy-Benazepril Hcl     Other reaction(s): Unknown   Atorvastatin Nausea And Vomiting    Onset 02/08/2011.   Augmentin [Amoxicillin-Pot Clavulanate]    Bactrim [Sulfamethoxazole-Trimethoprim]    Etodolac    Fosinopril    Ibudone [Hydrocodone-Ibuprofen]    Januvia [Sitagliptin]    Lipitor [Atorvastatin Calcium ]    Metronidazole    Mobic [Meloxicam]    Penicillins    Pravastatin    Simvastatin    Vicodin [Hydrocodone-Acetaminophen ]    Tylenol  [Acetaminophen ] Rash    Family History: No family history on file.  Social History:  reports that she has never smoked. She has never used smokeless tobacco. She reports that she does not drink alcohol and does not use drugs.  ROS:                                        Physical  Exam: There were no vitals taken for this visit.  Constitutional:  Alert and oriented, No acute distress. HEENT: [place] AT, moist mucus membranes.  Trachea midline, no masses.  Laboratory Data: Lab Results  Component Value Date   WBC 5.3 04/20/2024   HGB 12.3 04/20/2024   HCT 40.5 04/20/2024   MCV 92.0 04/20/2024   PLT 210 04/20/2024    Lab Results  Component Value Date   CREATININE 1.43 (H) 04/20/2024    No results found for: PSA  No results found for: TESTOSTERONE  No results found for: HGBA1C  Urinalysis    Component Value Date/Time   COLORURINE STRAW (A) 04/21/2024 0059   APPEARANCEUR CLEAR (A) 04/21/2024 0059   APPEARANCEUR Cloudy (A) 01/10/2020 1510   LABSPEC 1.014 04/21/2024 0059   PHURINE 6.0 04/21/2024 0059   GLUCOSEU >=500 (A) 04/21/2024 0059   HGBUR NEGATIVE 04/21/2024 0059   BILIRUBINUR NEGATIVE 04/21/2024 0059   BILIRUBINUR Negative 01/10/2020 1510   KETONESUR NEGATIVE 04/21/2024 0059   PROTEINUR 30 (A) 04/21/2024 0059   NITRITE NEGATIVE 04/21/2024 0059   LEUKOCYTESUR NEGATIVE 04/21/2024 0059    Pertinent Imaging:   Assessment & Plan: Picture was drawn.  She has apical prolapse.  She does not appear to be a surgical candidate.  I think she will be a challenging pessary fitting.  Role of referral to urogynecology discussed.  Patient will be referred and she agreed with the plan I will see her as needed  There are no diagnoses linked to this encounter.  No follow-ups on file.  Glendia DELENA Elizabeth, MD  [facility] [address] [place], [place] [address] [phone]

## 2024-04-26 NOTE — Addendum Note (Signed)
 Addended by: Jaylani Mcguinn A on: 04/26/2024 03:49 PM   Modules accepted: Orders

## 2024-04-27 LAB — URINALYSIS, COMPLETE
Bilirubin, UA: NEGATIVE
Ketones, UA: NEGATIVE
Leukocytes,UA: NEGATIVE
Nitrite, UA: NEGATIVE
RBC, UA: NEGATIVE
Specific Gravity, UA: 1.02 (ref 1.005–1.030)
Urobilinogen, Ur: 0.2 mg/dL (ref 0.2–1.0)
pH, UA: 6 (ref 5.0–7.5)

## 2024-04-27 LAB — MICROSCOPIC EXAMINATION: Bacteria, UA: NONE SEEN
